# Patient Record
Sex: Female | Born: 1935 | Race: Black or African American | Hispanic: No | Smoking: Former smoker
Health system: Southern US, Community
[De-identification: ages and names within clinical notes are randomized; demographics above are authoritative.]

## PROBLEM LIST (undated history)

## (undated) DIAGNOSIS — I2510 Atherosclerotic heart disease of native coronary artery without angina pectoris: Secondary | ICD-10-CM

## (undated) DIAGNOSIS — I5030 Unspecified diastolic (congestive) heart failure: Secondary | ICD-10-CM

## (undated) DIAGNOSIS — I219 Acute myocardial infarction, unspecified: Secondary | ICD-10-CM

## (undated) DIAGNOSIS — I10 Essential (primary) hypertension: Secondary | ICD-10-CM

## (undated) DIAGNOSIS — E785 Hyperlipidemia, unspecified: Secondary | ICD-10-CM

## (undated) DIAGNOSIS — N184 Chronic kidney disease, stage 4 (severe): Secondary | ICD-10-CM

## (undated) DIAGNOSIS — F0390 Unspecified dementia without behavioral disturbance: Secondary | ICD-10-CM

## (undated) DIAGNOSIS — D649 Anemia, unspecified: Secondary | ICD-10-CM

## (undated) DIAGNOSIS — M109 Gout, unspecified: Secondary | ICD-10-CM

## (undated) DIAGNOSIS — E119 Type 2 diabetes mellitus without complications: Secondary | ICD-10-CM

## (undated) DIAGNOSIS — R0602 Shortness of breath: Secondary | ICD-10-CM

## (undated) DIAGNOSIS — M1990 Unspecified osteoarthritis, unspecified site: Secondary | ICD-10-CM

## (undated) HISTORY — PX: TOTAL SHOULDER REPLACEMENT: SUR1217

## (undated) HISTORY — PX: ABDOMINAL SURGERY: SHX537

## (undated) HISTORY — PX: CHOLECYSTECTOMY: SHX55

## (undated) HISTORY — PX: APPENDECTOMY: SHX54

## (undated) HISTORY — PX: REPLACEMENT TOTAL KNEE: SUR1224

---

## 1997-03-06 HISTORY — PX: CARDIAC CATHETERIZATION: SHX172

## 1997-10-04 DIAGNOSIS — I219 Acute myocardial infarction, unspecified: Secondary | ICD-10-CM

## 1997-10-04 HISTORY — DX: Acute myocardial infarction, unspecified: I21.9

## 1997-10-30 ENCOUNTER — Inpatient Hospital Stay (HOSPITAL_COMMUNITY): Admission: EM | Admit: 1997-10-30 | Discharge: 1997-11-03 | Payer: Self-pay | Admitting: Emergency Medicine

## 1999-06-28 ENCOUNTER — Encounter: Payer: Self-pay | Admitting: Internal Medicine

## 1999-06-28 ENCOUNTER — Ambulatory Visit (HOSPITAL_COMMUNITY): Admission: RE | Admit: 1999-06-28 | Discharge: 1999-06-28 | Payer: Self-pay | Admitting: Internal Medicine

## 1999-10-27 ENCOUNTER — Emergency Department (HOSPITAL_COMMUNITY): Admission: EM | Admit: 1999-10-27 | Discharge: 1999-10-27 | Payer: Self-pay | Admitting: Emergency Medicine

## 2000-11-08 ENCOUNTER — Encounter: Payer: Self-pay | Admitting: Internal Medicine

## 2000-11-08 ENCOUNTER — Ambulatory Visit (HOSPITAL_COMMUNITY): Admission: RE | Admit: 2000-11-08 | Discharge: 2000-11-08 | Payer: Self-pay | Admitting: Internal Medicine

## 2001-12-24 ENCOUNTER — Encounter (HOSPITAL_BASED_OUTPATIENT_CLINIC_OR_DEPARTMENT_OTHER): Admission: RE | Admit: 2001-12-24 | Discharge: 2002-03-24 | Payer: Self-pay | Admitting: Internal Medicine

## 2002-04-04 ENCOUNTER — Encounter (HOSPITAL_BASED_OUTPATIENT_CLINIC_OR_DEPARTMENT_OTHER): Admission: RE | Admit: 2002-04-04 | Discharge: 2002-07-03 | Payer: Self-pay | Admitting: Internal Medicine

## 2002-06-06 ENCOUNTER — Encounter: Payer: Self-pay | Admitting: Orthopedic Surgery

## 2002-06-11 ENCOUNTER — Inpatient Hospital Stay (HOSPITAL_COMMUNITY): Admission: RE | Admit: 2002-06-11 | Discharge: 2002-06-16 | Payer: Self-pay | Admitting: Orthopedic Surgery

## 2002-06-16 ENCOUNTER — Inpatient Hospital Stay (HOSPITAL_COMMUNITY)
Admission: RE | Admit: 2002-06-16 | Discharge: 2002-07-01 | Payer: Self-pay | Admitting: Physical Medicine & Rehabilitation

## 2002-08-14 ENCOUNTER — Encounter (HOSPITAL_BASED_OUTPATIENT_CLINIC_OR_DEPARTMENT_OTHER): Admission: RE | Admit: 2002-08-14 | Discharge: 2002-11-12 | Payer: Self-pay | Admitting: Internal Medicine

## 2003-01-15 ENCOUNTER — Encounter (HOSPITAL_BASED_OUTPATIENT_CLINIC_OR_DEPARTMENT_OTHER): Admission: RE | Admit: 2003-01-15 | Discharge: 2003-01-27 | Payer: Self-pay | Admitting: Internal Medicine

## 2003-04-24 ENCOUNTER — Encounter (HOSPITAL_BASED_OUTPATIENT_CLINIC_OR_DEPARTMENT_OTHER): Admission: RE | Admit: 2003-04-24 | Discharge: 2003-04-30 | Payer: Self-pay | Admitting: Internal Medicine

## 2003-07-29 ENCOUNTER — Encounter (HOSPITAL_BASED_OUTPATIENT_CLINIC_OR_DEPARTMENT_OTHER): Admission: RE | Admit: 2003-07-29 | Discharge: 2003-08-19 | Payer: Self-pay | Admitting: Internal Medicine

## 2003-11-11 ENCOUNTER — Encounter (HOSPITAL_BASED_OUTPATIENT_CLINIC_OR_DEPARTMENT_OTHER): Admission: RE | Admit: 2003-11-11 | Discharge: 2003-12-09 | Payer: Self-pay | Admitting: Internal Medicine

## 2004-09-07 ENCOUNTER — Ambulatory Visit (HOSPITAL_COMMUNITY): Admission: RE | Admit: 2004-09-07 | Discharge: 2004-09-07 | Payer: Self-pay | Admitting: Internal Medicine

## 2005-09-15 ENCOUNTER — Encounter: Admission: RE | Admit: 2005-09-15 | Discharge: 2005-10-18 | Payer: Self-pay | Admitting: Orthopaedic Surgery

## 2006-11-30 ENCOUNTER — Ambulatory Visit (HOSPITAL_COMMUNITY): Admission: RE | Admit: 2006-11-30 | Discharge: 2006-11-30 | Payer: Self-pay | Admitting: Orthopedic Surgery

## 2006-12-07 ENCOUNTER — Ambulatory Visit: Payer: Self-pay | Admitting: Cardiology

## 2006-12-11 ENCOUNTER — Ambulatory Visit: Payer: Self-pay

## 2007-01-01 ENCOUNTER — Inpatient Hospital Stay (HOSPITAL_COMMUNITY): Admission: RE | Admit: 2007-01-01 | Discharge: 2007-01-08 | Payer: Self-pay | Admitting: Orthopedic Surgery

## 2007-05-17 ENCOUNTER — Emergency Department (HOSPITAL_COMMUNITY): Admission: EM | Admit: 2007-05-17 | Discharge: 2007-05-17 | Payer: Self-pay | Admitting: Emergency Medicine

## 2007-05-17 ENCOUNTER — Encounter (INDEPENDENT_AMBULATORY_CARE_PROVIDER_SITE_OTHER): Payer: Self-pay | Admitting: Emergency Medicine

## 2007-05-17 ENCOUNTER — Ambulatory Visit: Payer: Self-pay | Admitting: Surgery

## 2008-04-15 ENCOUNTER — Encounter: Payer: Self-pay | Admitting: Cardiology

## 2008-07-08 ENCOUNTER — Encounter: Payer: Self-pay | Admitting: Cardiology

## 2008-07-22 DIAGNOSIS — E785 Hyperlipidemia, unspecified: Secondary | ICD-10-CM | POA: Insufficient documentation

## 2008-07-22 DIAGNOSIS — I10 Essential (primary) hypertension: Secondary | ICD-10-CM

## 2008-07-22 DIAGNOSIS — M1990 Unspecified osteoarthritis, unspecified site: Secondary | ICD-10-CM | POA: Insufficient documentation

## 2008-07-22 DIAGNOSIS — N259 Disorder resulting from impaired renal tubular function, unspecified: Secondary | ICD-10-CM | POA: Insufficient documentation

## 2008-07-22 DIAGNOSIS — I2510 Atherosclerotic heart disease of native coronary artery without angina pectoris: Secondary | ICD-10-CM | POA: Insufficient documentation

## 2008-07-22 DIAGNOSIS — E871 Hypo-osmolality and hyponatremia: Secondary | ICD-10-CM | POA: Insufficient documentation

## 2008-07-22 DIAGNOSIS — M109 Gout, unspecified: Secondary | ICD-10-CM

## 2008-07-22 DIAGNOSIS — D649 Anemia, unspecified: Secondary | ICD-10-CM

## 2008-07-22 DIAGNOSIS — R42 Dizziness and giddiness: Secondary | ICD-10-CM

## 2008-07-22 DIAGNOSIS — E119 Type 2 diabetes mellitus without complications: Secondary | ICD-10-CM

## 2008-07-24 ENCOUNTER — Ambulatory Visit: Payer: Self-pay | Admitting: Cardiology

## 2008-07-24 DIAGNOSIS — R609 Edema, unspecified: Secondary | ICD-10-CM | POA: Insufficient documentation

## 2008-07-31 ENCOUNTER — Ambulatory Visit: Payer: Self-pay | Admitting: Cardiology

## 2008-07-31 ENCOUNTER — Ambulatory Visit: Payer: Self-pay

## 2008-07-31 ENCOUNTER — Encounter: Payer: Self-pay | Admitting: Cardiology

## 2008-08-05 LAB — CONVERTED CEMR LAB
CO2: 31 meq/L (ref 19–32)
Calcium: 8.9 mg/dL (ref 8.4–10.5)
Creatinine, Ser: 2.4 mg/dL — ABNORMAL HIGH (ref 0.4–1.2)
Glucose, Bld: 99 mg/dL (ref 70–99)

## 2009-05-24 ENCOUNTER — Emergency Department (HOSPITAL_COMMUNITY): Admission: EM | Admit: 2009-05-24 | Discharge: 2009-05-24 | Payer: Self-pay | Admitting: Family Medicine

## 2009-05-24 ENCOUNTER — Ambulatory Visit: Payer: Self-pay | Admitting: Cardiology

## 2009-05-24 ENCOUNTER — Inpatient Hospital Stay (HOSPITAL_COMMUNITY): Admission: EM | Admit: 2009-05-24 | Discharge: 2009-05-27 | Payer: Self-pay | Admitting: Emergency Medicine

## 2009-05-25 ENCOUNTER — Encounter (INDEPENDENT_AMBULATORY_CARE_PROVIDER_SITE_OTHER): Payer: Self-pay | Admitting: Family Medicine

## 2009-06-22 ENCOUNTER — Ambulatory Visit (HOSPITAL_COMMUNITY): Admission: RE | Admit: 2009-06-22 | Discharge: 2009-06-22 | Payer: Self-pay | Admitting: Internal Medicine

## 2010-05-27 LAB — POCT I-STAT, CHEM 8
BUN: 47 mg/dL — ABNORMAL HIGH (ref 6–23)
Calcium, Ion: 1.04 mmol/L — ABNORMAL LOW (ref 1.12–1.32)
Chloride: 108 mEq/L (ref 96–112)
Creatinine, Ser: 3.1 mg/dL — ABNORMAL HIGH (ref 0.4–1.2)
Glucose, Bld: 77 mg/dL (ref 70–99)
HCT: 30 % — ABNORMAL LOW (ref 36.0–46.0)
Hemoglobin: 10.2 g/dL — ABNORMAL LOW (ref 12.0–15.0)
Potassium: 4.3 mEq/L (ref 3.5–5.1)
Sodium: 136 mEq/L (ref 135–145)
TCO2: 28 mmol/L (ref 0–100)

## 2010-05-27 LAB — GLUCOSE, CAPILLARY
Glucose-Capillary: 114 mg/dL — ABNORMAL HIGH (ref 70–99)
Glucose-Capillary: 117 mg/dL — ABNORMAL HIGH (ref 70–99)
Glucose-Capillary: 138 mg/dL — ABNORMAL HIGH (ref 70–99)
Glucose-Capillary: 153 mg/dL — ABNORMAL HIGH (ref 70–99)
Glucose-Capillary: 162 mg/dL — ABNORMAL HIGH (ref 70–99)
Glucose-Capillary: 181 mg/dL — ABNORMAL HIGH (ref 70–99)
Glucose-Capillary: 243 mg/dL — ABNORMAL HIGH (ref 70–99)
Glucose-Capillary: 267 mg/dL — ABNORMAL HIGH (ref 70–99)
Glucose-Capillary: 42 mg/dL — CL (ref 70–99)

## 2010-05-27 LAB — URINE MICROSCOPIC-ADD ON

## 2010-05-27 LAB — CK TOTAL AND CKMB (NOT AT ARMC)
CK, MB: 2.6 ng/mL (ref 0.3–4.0)
Relative Index: 0.7 (ref 0.0–2.5)

## 2010-05-27 LAB — URINE CULTURE: Culture: NO GROWTH

## 2010-05-27 LAB — CBC
HCT: 29.7 % — ABNORMAL LOW (ref 36.0–46.0)
MCHC: 32.8 g/dL (ref 30.0–36.0)
MCHC: 32.9 g/dL (ref 30.0–36.0)
MCV: 92 fL (ref 78.0–100.0)
Platelets: 215 10*3/uL (ref 150–400)
RBC: 3.27 MIL/uL — ABNORMAL LOW (ref 3.87–5.11)
RDW: 14.7 % (ref 11.5–15.5)
RDW: 14.8 % (ref 11.5–15.5)
WBC: 11.1 10*3/uL — ABNORMAL HIGH (ref 4.0–10.5)

## 2010-05-27 LAB — DIFFERENTIAL
Basophils Absolute: 0 10*3/uL (ref 0.0–0.1)
Basophils Relative: 0 % (ref 0–1)
Eosinophils Absolute: 0.5 10*3/uL (ref 0.0–0.7)
Eosinophils Relative: 6 % — ABNORMAL HIGH (ref 0–5)
Lymphocytes Relative: 21 % (ref 12–46)
Lymphs Abs: 1.8 10*3/uL (ref 0.7–4.0)
Monocytes Absolute: 0.6 10*3/uL (ref 0.1–1.0)
Monocytes Relative: 7 % (ref 3–12)
Neutro Abs: 5.7 10*3/uL (ref 1.7–7.7)
Neutrophils Relative %: 67 % (ref 43–77)

## 2010-05-27 LAB — BASIC METABOLIC PANEL
BUN: 47 mg/dL — ABNORMAL HIGH (ref 6–23)
BUN: 63 mg/dL — ABNORMAL HIGH (ref 6–23)
CO2: 26 mEq/L (ref 19–32)
CO2: 26 mEq/L (ref 19–32)
Calcium: 8.6 mg/dL (ref 8.4–10.5)
Chloride: 100 mEq/L (ref 96–112)
Chloride: 102 mEq/L (ref 96–112)
Creatinine, Ser: 2.88 mg/dL — ABNORMAL HIGH (ref 0.4–1.2)
Creatinine, Ser: 3.07 mg/dL — ABNORMAL HIGH (ref 0.4–1.2)
GFR calc Af Amer: 19 mL/min — ABNORMAL LOW (ref >60)
GFR calc non Af Amer: 15 mL/min — ABNORMAL LOW (ref >60)
GFR calc non Af Amer: 16 mL/min — ABNORMAL LOW (ref >60)
Glucose, Bld: 127 mg/dL — ABNORMAL HIGH (ref 70–99)
Glucose, Bld: 241 mg/dL — ABNORMAL HIGH (ref 70–99)
Potassium: 4.7 mEq/L (ref 3.5–5.1)
Potassium: 4.9 mEq/L (ref 3.5–5.1)
Sodium: 137 mEq/L (ref 135–145)
Sodium: 137 mEq/L (ref 135–145)

## 2010-05-27 LAB — URINALYSIS, ROUTINE W REFLEX MICROSCOPIC
Bilirubin Urine: NEGATIVE
Glucose, UA: NEGATIVE mg/dL
Ketones, ur: NEGATIVE mg/dL
Leukocytes, UA: NEGATIVE
Nitrite: NEGATIVE
Protein, ur: NEGATIVE mg/dL
Specific Gravity, Urine: 1.008 (ref 1.005–1.030)
Urobilinogen, UA: 0.2 mg/dL (ref 0.0–1.0)
pH: 5 (ref 5.0–8.0)

## 2010-05-27 LAB — CARDIAC PANEL(CRET KIN+CKTOT+MB+TROPI)
CK, MB: 2.4 ng/mL (ref 0.3–4.0)
Total CK: 272 U/L — ABNORMAL HIGH (ref 7–177)
Troponin I: 0.03 ng/mL (ref 0.00–0.06)
Troponin I: 0.04 ng/mL (ref 0.00–0.06)

## 2010-05-27 LAB — CULTURE, BLOOD (ROUTINE X 2)
Culture: NO GROWTH
Culture: NO GROWTH

## 2010-05-27 LAB — TROPONIN I: Troponin I: 0.06 ng/mL (ref 0.00–0.06)

## 2010-05-27 LAB — IRON AND TIBC: Iron: 61 ug/dL (ref 42–135)

## 2010-05-27 LAB — POCT CARDIAC MARKERS
CKMB, poc: 2.8 ng/mL (ref 1.0–8.0)
Troponin i, poc: <0.05 ng/mL (ref 0.00–0.09)

## 2010-05-27 LAB — RETICULOCYTES
RBC.: 3.21 MIL/uL — ABNORMAL LOW (ref 3.87–5.11)
Retic Count, Absolute: 57.8 10*3/uL (ref 19.0–186.0)

## 2010-05-27 LAB — FERRITIN: Ferritin: 179 ng/mL (ref 10–291)

## 2010-07-19 NOTE — Op Note (Signed)
NAMEJALAIYAH, THROGMORTON              ACCOUNT NO.:  [id number]   MEDICAL RECORD NO.:  [id number]          PATIENT TYPE:  INP   LOCATION:  1608                         FACILITY:  [facility]   PHYSICIAN:  Deidre Ala, M.D.    DATE OF BIRTH:  01/06/1925   DATE OF PROCEDURE:  01/01/2007  DATE OF DISCHARGE:                               OPERATIVE REPORT   PREOPERATIVE DIAGNOSIS:  1. Severe end stage degenerative joint disease, right shoulder.  2. Severe symptomatic osteoarthritis, acromioclavicular joint.  3. Painful right metal Mitek anchors from previous rotator cuff repair      with rotator cuff arthropathy.   POSTOPERATIVE DIAGNOSIS:  1. Severe end stage degenerative joint disease, right shoulder.  2. Severe symptomatic osteoarthritis, acromioclavicular joint.  3. Painful right metal Mitek anchors from previous rotator cuff repair      with rotator cuff arthropathy.   PROCEDURE:  1. Right shoulder hemiarthroplasty using the Global uncemented DePuy      prosthesis with regular head.  2. Open distal clavicle resection.  3. Metal Mitek removal x2.   SURGEON:  1. Charlesetta Shanks, M.D.   ASSISTANT:  Phineas Semen, P.A.-C.   ANESTHESIA:  General endotracheal.   CULTURES:  None.   DRAINS:  None.   BLOOD LOSS:  350 mL.   BLOOD REPLACED:  None.   PATHOLOGIC FINDINGS AND HISTORY:  Mariachristina has bilateral shoulder end stage  DJD.  She has had rotator cuff surgery on the right side which has  failed.  She has rotator cuff arthropathy and a somewhat diminutive  right shoulder humeral head.  She had obviously arthritic AC joint and  severe synovitic changes in the glenohumeral joint which had to be  debrided to get to the joint as well as the [facility] joint.  We tried to place  a CTA head since she was rotator cuff deficient with some re-tearing of  the cuff, but the head would not fit well inside the space allowed, it  would not fit down on the prosthesis well, and we did do a tuberoplasty  to  accommodate the CTA head so I do not think it will cause problems  with upward impingement and she has had previous acromioplasty and today  had a distal clavicle resection. So, we used a regular head which fit  and basically measured exactly the same of what was taken out of her  which was 48 x 15 of the articular head fragment and then we did the  classic cut for the CTA for the tuberosity which will prevent her  bumping upward.  We had good fit and fill with the appropriate  retroversion with using a number 10 stem, length 138, and a 48 x 15 mm  head.  We got a very good capsular and subscapularis combined closure  with 6-7 #1 interrupted FiberWires back to the tuft of tendon where it  was removed.  We left the biceps intact and we oriented the prosthesis  to the apex where the bicipital groove was.  Large amounts of  osteophytes were removed.  We also, through this incision, snuck  upward  into the area of the distal clavicle underneath with the retractor and  was able to do a distal clavicle resection one shaverbreadth and with  debridement of the [facility] joint meniscus and marked synovitis.  We also  debrided marked synovitis out of the glenohumeral joint just to get a  flat surface for the humeral head and this was accomplished.  Two Mitek  anchors were encountered posteriorly on the neck and they were removed  as they were partially protruding after the neck cut and were somewhat  loose as well as numerous Ethibond sutures.   OPERATIVE PROCEDURE:  With adequate anesthesia obtained using  endotracheal technique, 1 gram Ancef given IV prophylaxis, the patient  was placed in the supine beach chair position.  The right shoulder was  prepped and draped in the standard fashion.  After standard prepping and  draping, skin markings were made for anatomic positioning.  We made an  incision from the coracoid process down into the axilla crisscross over  toward the deltoid insertion with a  modified deltopectoral incision of  [place].  The incision was deepened sharply with the knife and  hemostasis obtained using the Bovie electrocoagulator.  Under headlamp  illumination and loupe magnification, the incision was deepened sharply  with the knife and hemostasis obtained using Bovie electrocoagulator.  Dissection was carried down through the fat layer and the deltopectoral  interval was identified.  A fairly vestigial cephalic vein was noted and  it was clamped and tied with silk sutures.  We then released the pre-  coracoid fascia.  I then released the lateral aspect and tagged the  conjoined tendon laterally 1/3 to gain better exposure of the head.  Deep retractors were placed.   I then, just medial to the lesser tuberosity and the long head of the  biceps with needle point Bovie, incised and reflected medially and  inferiorly superiorly the flap of the capsule and the subscapularis and  tagged with multiple #2 FiberWires.  This exposed the humeral head and  glenoid.  This was reflected backward and retracted.  We then removed  osteophytes around the humeral head.  We excised a significant villous  synovitis out of the glenoid so that we could get back to the glenoid  surface.  I then used the template and with the arm rotated about 40  degrees external, made a straight back cut in the line with the template  removing the articular head fragment and measured it.  Further  debridement was carried out.  I then drilled with the intermedullary  drill and then a hand drill, canal finder, and then broached up to a 10,  placed a 10 trial. We cut for the CTA head flanged on the tuberosity but  the CTA head did not fit well, so we ended up trialing a 48 x 15 with  good reduction.  We then, in the appropriate anteversion and the apex  toward the bicipital groove and with some local bone grafting from the  humeral head, impacted the implantable stem, 10 mm flush, and impacted  it  with no rotation seen with the local impaction grafting.  We then  trialed again with a 48 x 15.  We then implanted the 48 x 15 head.  We  then brought the capsule and subscapularis flap back to the lesser  tuberosity and sutured it down with multiple interrupted #2 FiberWire  sutures, repairing the anterior capsule and subscapularis and some  stitches superior and  inferior to complete the capsular closure.  This  reduced the head nicely.  We had an anterior posterior drawer about half  the humeral head.  We had abduction, forward flexion with hand up toward  the top of the head, so that it was not overstuffed and it felt about  appropriate to the size of the head that was removed.   We then further irrigated, closed the conjoined tendon back to the  coracoid with a tagging stitch of #1 Ethibond, closed the deep layer  with a running 0 Vicryl on the deltopectoral fascia, and then 2-0 and 3-  0 Vicryl on the subcu, and later skin staples.  0.5% Marcaine injected  in and about the wound.  Prior to this, we had retracted up underneath  toward the spike of the distal clavicle, dissected the soft tissues down  to the distal clavicle transverse to it as one would do with a saber  cut, and dissected out with the Bovie the villous synovitis and AC  meniscus, and completely debrided it and then, with bone cutting  forceps, did a distal clavicle resection with the bevel about 10 mm in.  We then smoothed it further and closed the soft tissues with 0 Vicryl  crisscross figure-of-eight to close the soft tissues over the top.  After that, we proceeded with the general closure of the deltopectoral  fascia, subcu, and skin.  Please note that just prior to implanting the  prosthesis, we did find the two Mitek anchors posterior superior that  were protruding and we removed those along with several suture knots  with a rongeur.  Thorough irrigation was carried  out prior to the general closure.  Once the  closure was obtained and  Marcaine placed, a bulky sterile compressive dressing was applied with  Aquasil and sling.  The patient, having tolerated the procedure well,  was awakened and taken to the recovery room in satisfactory condition to  be admitted for routine postoperative care.           ______________________________  V. Charlesetta Shanks, M.D.     VEP/MEDQ  D:  01/01/2007  T:  01/01/2007  Job:  [id number]   cc:   Margaretmary Bayley, M.D.  Fax: [phone]   Madolyn Frieze. Jens Som, MD, [facility]  [address]  [address]  [place]  [place] [address]   Luis Abed, MD, [facility]  [address]  [address]  [place]  [place] [address]

## 2010-07-19 NOTE — Assessment & Plan Note (Signed)
[facility] OFFICE NOTE   [name], [name]                     MRN:          [id number]  DATE:12/07/2006                            DOB:          01/06/1925    [name] [name] is a very pleasant 77 year old female who I had followed in  the past for coronary disease.  Her cardiac history dates back to August  of 1999.  At that time, she presented with a myocardial infarction.  She  underwent cardiac catheterization.  She was found to have a normal left  main.  There was no obstructive disease in the LAD.  There was 60% to  70% second marginal.  There was a 90% proximal right coronary artery  followed by an 80% stenosis.  There was also a spontaneous spiral  dissection starting in the proximal right coronary artery and extending  through the mid RCA to the acute angle of the RCA.  At that time, the  patient had a PCI of her right coronary artery.  Her most recent Myoview  was performed on May 27, 2003.  Ejection fraction was 63%.  There was  a small inferior basal defect consistent with scar, but no ischemia.  Since I last saw her in September 2005, she has done well.  She denies  any dyspnea, orthopnea, PND, palpitations, or chest pain.  She does  occasionally have pedal edema.  She has had problems with pain in her  shoulders bilaterally, and is scheduled for shoulder replacement.  We  were asked to evaluate preoperatively.   MEDICATIONS INCLUDE:  1. Amaryl 4 mg p.o. b.i.d.  2. Allopurinol 150 mg p.o. daily.  3. Aspirin 81 mg p.o. daily.  4. Coreg CR 20 mg p.o. daily.  5. Demadex 20 mg p.o. daily.  6. Actos 15 mg p.o. every other day.  7. Felodipine ER 5 mg p.o. daily.   PHYSICAL EXAMINATION:  VITAL SIGNS:  Shows a blood pressure of 120/70.  Her pulse is 60.  She weighs 220 pounds.  HEENT:  Normal.  NECK:  Supple with no bruits.  CHEST:  Clear.  CARDIOVASCULAR EXAM:  Reveals a regular rate and  rhythm.  ABDOMINAL EXAM:  Benign.  EXTREMITIES:  Show trace to 1+ ankle edema, left greater than right.   I do have electrocardiograms from November 30, 2006 at [facility].  These shows sinus rhythm with occasional PAC's and a prior  inferior infarct.   DIAGNOSES:  1. Preoperative evaluation.  [name] [name] has had no chest pain or      shortness of breath, but she does have a history of coronary      disease, as well as diabetes mellitus and hypertension.  We will      plan to proceed with an adenosine Myoview for risk stratification.      If this shows no ischemia, or is low risk, then we will plan to      proceed with surgery without further cardiac workup.  2. Coronary artery disease.  She will continue on her aspirin and beta  blocker.  Note, she has not been interested in taking a statin the      past, and remains unwilling.  I have asked her to discuss this with      Dr. Chestine Spore.  3. Hypertension.  Her blood pressure is well controlled on her present      medications.  4. Diabetes mellitus.  Per Dr. Chestine Spore.  5. History of renal insufficiency.  Her BUN and creatinine are      followed by Dr. Chestine Spore.   We will see her back in approximately 12 months, or sooner if necessary.     Madolyn Frieze Jens Som, MD, [facility]  Electronically Signed    BSC/MedQ  DD: 12/07/2006  DT: 12/07/2006  Job #: [id number]   cc:   [name] [name], M.D.  [name] [name], M.D.

## 2010-07-19 NOTE — Consult Note (Signed)
[name] [name], [name] [name]              ACCOUNT NO.:  [id number]   MEDICAL RECORD NO.:  [id number]          PATIENT TYPE:  INP   LOCATION:  1608                         FACILITY:  [facility]   PHYSICIAN:  Mindi Slicker. Lowell Guitar, M.D.  DATE OF BIRTH:  01/06/1925   DATE OF CONSULTATION:  DATE OF DISCHARGE:                                 CONSULTATION   NEPHROLOGY CONSULTATION:   REFERRING PHYSICIAN:  Dr. Deidre Ala.   REASON FOR CONSULTATION:  I was asked by Dr. Renae Fickle to see this 77-year-  old female with renal disease.  The patient was admitted on January 01, 2007 for a right shoulder arthroplasty.  Intraoperatively, the patient  develops mild hypotension that was corrected quickly.  Preop, the serum  creatinine was 2 mg/dL on November 30, 2006.  Urinalysis was negative  at that time.  On the day of the surgery, October 28, serum creatinine  was 1.86 mg/dL and on October 29, serum creatinine was 1.78 mg/kg and  today on October 30, serum creatinine is 2.03 mg/dL.  Of note, according  to the [facility] computer, on June 06, 2002, serum creatinine was  measured at 1.7 mg/dL.  The patient's primary physician is Dr. Margaretmary Bayley.  The patient has history of hypertension and diabetes of many  years' duration.  She denies any recent NSAID congestion, but admits to  taking the them in the past.  She also admits to taking Celebrex in the  past.  She currently takes glucosamine for her arthritis.   PAST HISTORY:  1. Status post myocardial infarction in 1999.  2. Non-insulin-dependent diabetes mellitus.  3. Hypertension.  4. Vertigo.  5. History of prior left total knee replacement.  6. History of chronic kidney disease.   CURRENT MEDICATIONS:  Zyloprim, Coreg, ferrous sulfate, Actos and  Reglan.   PHYSICAL EXAMINATION:  VITAL SIGNS:  Blood pressure is 116/68,  temperature is 98.8 and heart rate 70.  GENERAL:  This is a pleasant, obese [place] female.  She  appears uncomfortable.  HEENT:  Atraumatic, normocephalic.  Extraocular movements are intact.  LUNGS:  Clear anteriorly.  HEART:  Regular rhythm and rate.  ABDOMEN:  Soft, obese.  EXTREMITIES:  Trace edema bilaterally.  The right shoulder is in a  sling.  There are chronic changes of the joints.  NEUROLOGIC:  Not  tested.   ASSESSMENT:  Chronic kidney disease, stage IV.   PLAN:  We will follow and defer any further renal workup to this  patient's primary care physician, Dr. Chestine Spore.  If renal function worsens  during this hospitalization, we will work up her acute and chronic renal  failure.  I would continue to avoid NSAIDs as you are doing.   Thanks for allowing me to see this patient.      Mindi Slicker. Lowell Guitar, M.D.  Electronically Signed     ACP/MEDQ  D:  01/03/2007  T:  01/04/2007  Job:  [id number]   cc:   Deidre Ala, M.D.  Fax: [phone]   Margaretmary Bayley, M.D.  Fax: [phone]

## 2010-07-19 NOTE — Discharge Summary (Signed)
[name] [name], [name] [name]              ACCOUNT NO.:  [id number]   MEDICAL RECORD NO.:  [id number]          PATIENT TYPE:  INP   LOCATION:  1608                         FACILITY:  [facility]   PHYSICIAN:  Deidre Ala, M.D.    DATE OF BIRTH:  01/06/1925   DATE OF ADMISSION:  01/01/2007  DATE OF DISCHARGE:  01/05/2007                               DISCHARGE SUMMARY   FINAL DIAGNOSES:  1. End-stage degenerative joint disease, right shoulder.  2. Hypertension.  3. Diabetes mellitus Type 2.  4. Gout.  5. Hyperlipidemia.  6. Hyponatremia.  7. Blood loss anemia.  8. Renal insufficiency.   PROCEDURES:  On January 01, 2007, right shoulder hemiarthroplasty  surgery with distal clavicle resection and middle Mitek removal x2.   SURGEON:  1. Charlesetta Shanks, M.D.   HISTORY:  This is a 77 year old African-American female who has been  followed by Dr. Renae Fickle for some time about her shoulder pain.  The right  has been worse than the left.  She had injections in the shoulder.  She  had physical therapy, all of which has failed.  Range of motion is  significantly limited and is very painful.  She could not reach even her  forehead.  Because of this, she has opted for surgical intervention.  We  will reschedule her for surgery.   HOSPITAL COURSE:  Patient was admitted on January 01, 2007 to [facility].  There, she underwent right shoulder hemiarthroplasty  with distal clavicle resection and removal of two middle Mytek anchors.  Patient tolerated the procedure well.  There were no intraoperative  complication.  Postoperatively, the patient had noted worsening of her  creatinine.  It continued to elevate throughout her stay.  Because of  this, Dr. Lowell Guitar from [facility] was consulted.  He saw  the patient and noted that she has stage IV chronic renal disease.  He  was to follow her and would not do any further workup at this time but  after she sees her regular MD, Dr. Chestine Spore, he  can refer her back to them  for further workup as needed.  She also had blood loss anemia noted,  which remained relatively stable until the third postoperative day, at  which time her hemoglobin was down to 7.1, and her hematocrit was 27.3.  At this time, she received 2 units of packed red cells.  Repeat CBC on  the following day showed stable hemoglobin and hematocrit.  She was  working with physical therapy, but she was slow to respond.  She had  significant pain in her shoulder still postoperatively.  Her range of  motion was still limited.  At this point, it was decided whether or not  she would go home versus going to a skilled nursing facility.   On January 04, 2007, she was noted to have still a lot of pain in the  right shoulder with inability to move it.  The following morning, the  decision was made to discharge to home.  She was subsequently given  Percocet to take 1-2 p.o. q.4-6h. for pain.  MEDICATIONS:  She will continue her regular medications as taken prior  to discharge, which are:  1. Coreg CR 20 mg daily.  2. Actos 15 mg p.o. q.o.d.  3. Xylopram 150 mg daily.  4. She will continue on ferrous sulfate 325 mg p.o. daily for the next      30 days.  5. She will also be given some Robaxin to take 500 mg p.o. q.8h.      p.r.n.   Patient will follow up with Dr. Renae Fickle in approximately 10 days.  At that  time, we will remove her sutures/staples.   Patient is discharged in satisfactory and stable condition on January 05, 2007.      Phineas Semen, P.A.    ______________________________  Seth Bake. Charlesetta Shanks, M.D.    CL/MEDQ  D:  01/04/2007  T:  01/05/2007  Job:  [id number]

## 2010-07-22 NOTE — Op Note (Signed)
NAME:  [name] [name]                        ACCOUNT NO.:  [id number]   MEDICAL RECORD NO.:  [id number]                   PATIENT TYPE:  INP   LOCATION:  5009                                 FACILITY:  [facility]   PHYSICIAN:  Ollen Gross, M.D.                 DATE OF BIRTH:  01/06/1925   DATE OF PROCEDURE:  06/11/2002  DATE OF DISCHARGE:                                 OPERATIVE REPORT   PREOPERATIVE DIAGNOSIS:  Osteoarthritis, left knee.   POSTOPERATIVE DIAGNOSIS:  Osteoarthritis, left knee.   PROCEDURE:  Left total knee arthroplasty.   SURGEON:  Ollen Gross, M.D.   ASSISTANT:  Alexzandrew L. Julien Girt, P.A.   ANESTHESIA:  General plus femoral block.   ESTIMATED BLOOD LOSS:  Minimal.   DRAINS:  Hemovac x 1 .   COMPLICATIONS:  None.   TOURNIQUET TIME:  69 minutes at 350 mmHg.   CONDITION:  Stable to the recovery room.   BRIEF CLINICAL NOTE:  [name] is a 77 year old female who has severe  end stage arthritis of both knees with severe deformities and pain  refractory to nonoperative management.  The left knee was more symptomatic  than the right.  She presents now for left total knee arthroplasty.   PROCEDURE IN DETAIL:  After successful administration of femoral block and  general anesthetic, a tourniquet was placed high on the left thigh and the  left lower extremity prepped and draped in the usual sterile fashion.  The  extremity was wrapped in Esmarch, knee flexed, and tourniquet inflated to  350 mmHg.  A standard midline incision was made with a 10 blade through the  subcutaneous tissue to the level of the extensor mechanism.  A fresh blade  was used to make a parapatellar arthrotomy.  She had moderate size effusion,  a tremendous amount of hypertrophic synovial tissue which I excised.  We  then elevated the soft tissue over the proximal medial tibia,  subperiosteally, the joint with the knife and semimembranosus with a Cobb  elevator.  I had a fairly long  soft tissue sleeve, this had a severe varus  deformity.  We then everted the patella, flexed the knee 90 degrees, removed  the chondral osteophytes and the PCL.  A drill was used to create a starting  hole in the distal femur and the canal was irrigated.  A 5 degree left  valgus alignment guide is placed.  Reference of the condyle, the irritation  is marked and removing 10 mm of the distal femur and distal femoral  resection subsequently made with an oscillating saw.  A sizing guide is  placed and a size 3 is the most appropriate.  With the 3, if we referenced  off the posterior condyles, would have internally rotated the femur, thus I  referenced off the epicondylar axis to gain the appropriate external  rotation.  We pinned the size 3 block and  made the anterior and posterior  cuts.  The tibia was then subluxed anteriorly and the menisci were removed.  The extramedullary tibial alignment guide was placed referencing proximally  at the medial aspect of the tibial tubercle and distally along the second  metatarsal axis of the tibial crest.  I pinned the block and moved 10 mm  from the less deficient lateral side.  She had a really deep medial defect.  We did the cut and it was an excellent smooth surface.  We removed  osteophytes all the way around the medial side and thus there was cancellous  bone throughout the entire cut surface as opposed to having any further  defect.  A size 3 was the most appropriate tibial size.  We then completed  the femoral preparation with the intercondylar and chamfer cuts.   A size 3 posterior stabilized femoral trial, size 3 fixed bearing tibial  trial, and a 12.5 mm posterior stabilized insert placed.  With the 12.5, she  hyperextended a little and had a little bit of varus valgus play, we went up  to 15 which had fantastic balance throughout the full range of motion with  full extension and excellent varus valgus balance down past the 120 degrees  of  flexion.  We marked the rotation in full extension and the alignment rod  corresponded with the second metatarsal axis.  We then prepared the patella,  first by removing all the soft tissue and tendon and measured the thickness  which was 24 mm.  Free hand resections were taken to 14 mm.  The 38 template  was placed, holes were drilled, trial patella was placed and it tracked  normally.  We then completed the tibial preparation proximally with the  modular drill and keel punch and removed the osteophytes from the posterior  femur.  She had several large loose osseous bodies behind the femur which  were removed.   The cut bone surfaces were prepared with pulsatile lavage and the cement was  mixed.  Once we were ready for implantation, size 3 fixed bearing tibial  tray, size 3 posterior stabilized femur, and 38 patella were cemented in  place, the patella held with a clamp.  A trial 15 mm insert is placed with  the knee in full extension, all excess cement is removed.  Once the cement  was fully hardened, a permanent 15 mm posterior stabilized insert is placed  to the tibial tray.  Excellent balance is once again noted throughout full  range of motion.  We then placed the permanent posterior stabilized insert  into the size 3 tibial tray.  The wound was then copiously irrigated with  antibiotic solution and the extensor mechanism was closed over a Hemovac  drain with interrupted #1 PDS, the subcu was closed with interrupted 2-0  Vicryl, the subcuticular with running 4-0 Monocryl.  The incision was  cleaned and dried, Steri-Strips and a bulky, sterile dressing was applied.  The patient was subsequently awakened and transferred to the recovery room  in stable condition.                                               Ollen Gross, M.D.    FA/MEDQ  D:  06/11/2002  T:  06/12/2002  Job:  [id number]

## 2010-07-22 NOTE — Discharge Summary (Signed)
NAME:  [name] [name], [name] [name]                        ACCOUNT NO.:  [id number]   MEDICAL RECORD NO.:  [id number]                   PATIENT TYPE:  INP   LOCATION:  5009                                 FACILITY:  [facility]   PHYSICIAN:  Alexzandrew L. Julien Girt, P.A.        DATE OF BIRTH:  01/06/1925   DATE OF ADMISSION:  06/11/2002  DATE OF DISCHARGE:  06/16/2002                                 DISCHARGE SUMMARY   ADMISSION DIAGNOSES:  1. Osteoarthritis left knee.  2. Vertigo.  3. Hypertension.  4. History of myocardial infarction.  5. Non insulin diabetes mellitus.   DISCHARGE DIAGNOSES:  1. Osteoarthritis left knee, status post left total knee replacement     arthroplasty.  2. Postoperative blood loss anemia.  3. Status post  transfusion without sequelae.  4. Vertigo.  5. History of myocardial infarction.  6. Non insulin diabetes mellitus.   PROCEDURES:  The patient was taken to the operating room on June 11, 2002,  and underwent a left  total knee arthroplasty, surgeon Ollen Gross, M.D.,  assistant Alexzandrew L. Perkins, P.A.-C., under general anesthesia with a  femoral block. Minimal blood loss. Hemovac drain x1. Tourniquet time 69  minutes at 350 mmHg.   CONSULTS:  Rehabilitation services.   HISTORY OF PRESENT ILLNESS:  The patient is a 77 year old female who has  been seen by Dr. Lequita Halt for ongoing knee pain. The patient has been treated  and followed by Dr. Lequita Halt in the past. She does have bilateral knee pain,  but the left is more symptomatic than the right. This has been ongoing for  many years now. She has been treated conservatively. She has been told in  the past that she would require knee replacement. She had been for cardiac  workup by Dr. Jens Som and was cleared for surgery and was recommended by a  friend to see Dr. Lequita Halt.   She was seen in the office where she was found to have bone-on-bone changes.  It was felt she would be an appropriate candidate for  surgery. The risks and  benefits were discussed and the patient was subsequently admitted to the  hospital.   LABORATORY DATA:  CBC on admission: Hemoglobin 11.3, hematocrit 33.5, white  cell count 9.2, red blood cell count 3.6, post transfusion 8.4, hematocrit  24.8. Given blood, post transfusion hemoglobin 9.9. Last noted hemoglobin  and hematocrit 9.2 and 27.0. Differential on admission CBC all within normal  limits. PT,  PTT on admission 13.8 and 33 respectively with an INR  of 1.0.  Serial protimes followed. Last noted PT/INR 18.8 and 1.7. Chem panel on  admission, elevated glucose of 231, elevated BUN of 28. Total protein low at  5.9, decreased albumin of 3.3. Remaining chem panel within normal limits.  Serial BMETs were followed. Glucose went from 231 down to 226 down to 199.  BUN went from 28 to 25 back to 29, creatinine bumped up a little bit  from  1.7 to 2.0. Calcium dropped from 9.0 to 8.2. Sodium  dropped a little from  137 down to 133. Urinalysis negative. Blood type B positive.   An EKG dated June 06, 2002, normal sinus rhythm with a first degree AV  block, nonspecific T-wave abnormalities, no significant change since the  last tracing, confirmed by Dr. Nicki Guadalajara. A chest x-ray dated October 30, 1997, cardiomegaly, atelectasis in the right base.   HOSPITAL COURSE:  The patient was admitted to [facility] and was  taken to the operating room and underwent  the above stated procedure  without complications. The patient tolerated the procedure well. She was  later transferred to the recovery room and admitted to the orthopedic floor  and continued with postoperative care. Vital signs were followed. The  patient was given 24 hours of postoperative IV antibiotics and placed on  Coumadin for DVT prophylaxis. Physical therapy and occupational therapy were  consulted to assist with gait training and ambulation and activities of  daily living. A rehabilitation consult  was also ordered postoperatively. The  Hemovac drain placed at the time of surgery was left in on day 1 and pulled  on day 2.   Unfortunately the patient had a drop in her hemoglobin down to 8.4. She was  given 2 units of blood. Post transfusion her hemoglobin had responded well  and she came back up to 9.9 by day 2. The PCA and IVs were discontinued. The  Foley catheter was out. She had been by the rehabilitation service and was  felt to be an appropriate candidate for inpatient rehabilitation. Physical  therapy and occupational therapy were consulted postoperatively.   The patient was slow to progress with physical therapy and was only  ambulating approximately  8 feet by postoperative day #2 and then up to 15  feet by postoperative day #5. She was progressing slowly. Her incision was  checked on day 2 with a dressing change and  then each day thereafter. The  incision was healing well. She continued to slowly improve.   By day 5 on June 16, 2002, it was noted that a bed became available in  rehabilitation. The patient was in agreement with the treatment. She was  transferred at that time.   DISPOSITION:  The patient was transferred to [facility]  rehabilitation on June 16, 2002.   DISCHARGE MEDICATIONS:  Continue current medications. An INR to be sent  over.   DISCHARGE INSTRUCTIONS:  Diet, cardiac diabetic diet. Activity full weight  bearing to the left lower extremity. Continue with gait training, ambulation  and activities of daily living per physical therapy and occupational therapy  while on rehabilitation.   FOLLOW UP:  Follow up in 2 weeks from surgery.   CONDITION ON DISCHARGE:  Improved.                                               Alexzandrew L. Julien Girt, P.A.    ALP/MEDQ  D:  08/01/2002  T:  08/02/2002  Job:  [id number]

## 2010-07-22 NOTE — H&P (Signed)
[name] [name], RIDEOUT              ACCOUNT NO.:  [id number]   MEDICAL RECORD NO.:  [id number]          PATIENT TYPE:  INP   LOCATION:  1608                         FACILITY:  [facility]   PHYSICIAN:  Deidre Ala, M.D.    DATE OF BIRTH:  01/06/1925   DATE OF ADMISSION:  01/01/2007  DATE OF DISCHARGE:                              HISTORY & PHYSICAL   CHIEF COMPLAINT:  Chronic right shoulder pain.   HISTORY:  This is an 77 year old white female with history of DJD of the  right shoulder.  She has been followed by Dr. Renae Fickle for some time.  She  has had significant osteoarthritis of the right shoulder.  Because of  these findings, it came down to the patient needing surgery.  She has  been scheduled for a right shoulder hemiarthroplasty, and she will be  admitted to [facility] on January 01, 2007 for this procedure.   PAST MEDICAL HISTORY:  1. 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  2. Hypertension.  3. Diabetes mellitus.  4. Gout.  5. Hyperlipidemia.  6. Renal insufficiency.  7. Status post myocardial infarction in 1999.  8. History of vertigo.   PAST SURGICAL HISTORY:  1. Left total knee arthroplasty in the past.  2. Gallbladder surgery in the 1960s.  3. History of right rotator cuff repair 10 years ago.   CURRENT MEDICATIONS:  1. Torsemide 20 mg daily.  2. Felodipine ER 5 mg daily.  3. Coreg 20 mg daily.  4. Actos 50 mg every other day.  5. Aspirin 81 mg daily.  6. Glyburide 4 mg b.i.d.  7. Allopurinol 300 mg q.h.s.   SOCIAL HISTORY:  The patient denies the use of alcohol or tobacco.   FAMILY HISTORY:  Positive for coronary artery disease.  Hypertension.  No history of diabetes in her family that she knows of.   REVIEW OF SYSTEMS:  Positive for diabetes mellitus, hypertension,  coronary artery disease.  No history of any COPD, PND, __________  or  asthma.   PHYSICAL EXAMINATION:  GENERAL:  Well-developed, well-nourished  moderately obese 77 year old [place] female alert, oriented,  and cooperative.  HEENT:  Normocephalic, atraumatic.  Extraocular muscles intact.  PERRL.  Oropharynx clear.  Mucous membranes pink and moist.  NECK:  Supple without JVD, lymphadenopathy, or thyromegaly.  No carotid  bruits are noted.  Trachea is midline.  CHEST:  Symmetric.  Clear to auscultation.  No wheezes, rhonchi, or  rales noted.  CARDIOVASCULAR:  Regular rate and rhythm without murmurs, rubs, or  gallops.  ABDOMEN:  Soft.  Bowel sounds present.  No palpable pulsatile masses.  No hernias.  GU/RECTAL:  Deferred.  EXTREMITIES:  Without clubbing, cyanosis, or edema.  There is limited  range of motion, especially in the right shoulder.  There is also  limited range of motion of her left shoulder.  Peripheral pulses intact.  NEUROLOGIC:  Cranial nerves II-XII are grossly intact without focal  deficits.   IMPRESSION:  End-stage degenerative joint disease, right shoulder.   PLAN:  The patient will  undergo right shoulder hemiarthroplasty.      Phineas Semen, P.A.    ______________________________  Seth Bake. Charlesetta Shanks, M.D.    CL/MEDQ  D:  01/28/2007  T:  01/28/2007  Job:  [id number]

## 2010-07-22 NOTE — H&P (Signed)
NAME:  [name] [name], [name] [name]                        ACCOUNT NO.:  [id number]   MEDICAL RECORD NO.:  [id number]                   PATIENT TYPE:  INP   LOCATION:  5009                                 FACILITY:  [facility]   PHYSICIAN:  Ollen Gross, M.D.                 DATE OF BIRTH:  01/06/1925   DATE OF ADMISSION:  06/11/2002  DATE OF DISCHARGE:  06/16/2002                                HISTORY & PHYSICAL   CHIEF COMPLAINT:  Left knee pain.   HISTORY OF PRESENT ILLNESS:  The patient is a 77 year old female who has  been seen by Dr. Lequita Halt for ongoing left knee pain.  She has been treated  conservatively in the past for her left knee.  Her pain has been progressive  in nature over the past several years.  She was a previous patient of Dr.  Pearletha Furl.  She has been treated for bilateral knee pain.  However, the left  is more symptomatic then the right.  She has been seen earlier by another  orthopaedics, and recommended to undergo a knee replacement.  She apparently  had a cardiac workup by Dr. Jens Som, was cleared for surgery, but was told  by a friend to come over to see Dr. Lequita Halt.  She was evaluated and found to  have severe end-stage changes with end-stage symptoms.  It was felt she  would be an appropriate candidate.  Risks and benefits discussed, and the  patient has elected to proceed with surgery.   ALLERGIES:  No known drug allergies.   CURRENT MEDICATIONS:  1. Demodex 20 mg.  2. Bayer aspirin 81 mg, stopped prior to surgery.  3. Plendil 5 mg.  4. Antevert 12.5 mg.  5. Hydrocodone 5 mg p.r.n.  6. Tenormin 50 mg.  7. Allopurinol 300 mg.  8. Glucotrol XL 5 mg.   PAST MEDICAL HISTORY:  1. Vertigo.  2. Hypertension.  3. History of myocardial infarction in 1999.  4. Non-insulin dependent diabetes mellitus.   PAST SURGICAL HISTORY:  Gallbladder surgery.   SOCIAL HISTORY:  Widowed, retired, nonsmoker, no alcohol.  Has one child.  One story home with three to four steps  entering.   FAMILY HISTORY:  Mother deceased in her 1's with arthritis.  Has a sister  age 35 with arthritis.   REVIEW OF SYMPTOMS:  GENERAL:  No fevers, chills, or night sweats.  NEUROLOGIC:  She does have vertigo.  No seizures, syncope, or paralysis.  RESPIRATORY:  No shortness of breath, productive cough, or hemoptysis.  CARDIOVASCULAR:  Hypertension and myocardial infarction, however, denies any  chest pain, angina, or orthopnea.  GASTROINTESTINAL:  No nausea, vomiting,  diarrhea, or constipation.  GENITOURINARY:  No dysuria, hematuria, or  discharge.  MUSCULOSKELETAL:  Pertinent to that of the knee found in the  history of present illness.   PHYSICAL EXAMINATION:  VITAL SIGNS:  Pulse 60, respirations 12, blood  pressure  152/82.  GENERAL:  The patient is a 77 year old African-American female, well-  developed, well-nourished, overweight, appears to be in no acute distress.  HEENT:  Normocephalic, atraumatic.  Pupils equal, round, reactive to light.  Oropharynx clear.  Extraocular movements were intact.  NECK:  Supple.  CHEST:  Clear to auscultation anterior and posterior chest walls.  HEART:  Regular rate and rhythm.  ABDOMEN:  Round protuberant abdomen, bowel sounds are present.  RECTAL:  Not done, not pertinent to present illness.  BREASTS:  Not done, not pertinent to present illness.  GENITALIA:  Not done, not pertinent to present illness.  EXTREMITIES:  Significant to the knees lower extremity.  She does have  bilateral varus deformities, the left is more pronounced then the right.  The left knee shows 5 degrees of hyperextension with flexion up to 105  degrees, minimal laxity is noted with some firm endpoints.  She is diffusely  tender.   IMPRESSION:  1. Osteoarthritis of bilateral knees, left more symptomatic then right.  2. Hypertension.  3. History of myocardial infarction.  4. Vertigo.  5. Non-insulin dependent diabetes mellitus.   PLAN:  The patient will be  admitted to [facility] to undergo a  left total knee replacement arthroplasty.  Surgery will be performed by Dr.  Ollen Gross.       Alexzandrew L. Julien Girt, P.A.              Ollen Gross, M.D.    ALP/MEDQ  D:  07/08/2002  T:  07/09/2002  Job:  [id number]

## 2010-07-22 NOTE — Discharge Summary (Signed)
NAME:  [name] [name], [name] [name]                        ACCOUNT NO.:  [id number]   MEDICAL RECORD NO.:  [id number]                   PATIENT TYPE:  IPS   LOCATION:  4142                                 FACILITY:  [facility]   PHYSICIAN:  Ranelle Oyster, M.D.             DATE OF BIRTH:  01/06/1925   DATE OF ADMISSION:  06/16/2002  DATE OF DISCHARGE:  07/01/2002                                 DISCHARGE SUMMARY   DISCHARGE DIAGNOSES:  1. Left total knee arthroplasty secondary to osteoarthritis.  2. Diabetes mellitus.  3. History of gout.  4. History of vertigo.  5. Acute renal insufficiency.  6. Deep vein thrombosis prophylaxis.  7. Anemia.   HISTORY OF PRESENT ILLNESS:  The patient is a 77 year old black female with  past history of diabetes mellitus, OA of the left knee, elected to undergo a  left total knee replacement on April 7 by Ollen Gross, M.D.  The patient  is presently on Coumadin for DVT prophylaxis.  PT report at this time, the  patient is ambulating, mild assist, 15 feet with rolling walker,  weightbearing as tolerated, can transfer with total assist.  Postoperative  complications included, constipation, elevated BUN and creatinine.  The  patient was transferred to [facility] Department on Jul 16, 2002.   PAST MEDICAL HISTORY:  Significant for as above, glaucoma, gout, vertigo,  PUD.   PAST SURGICAL HISTORY:  Cholecystectomy, right rotator cuff repair.   Primary care by Margaretmary Bayley, M.D.; Cardiologist, Olga Millers, M.D.   FAMILY HISTORY:  Noncontributory.   SOCIAL HISTORY:  Patient lives with son in one level home.  She was  independent prior to admission.  Denies any tobacco or alcohol use.  She has  a master's degree in social work.   REVIEW OF SYSTEMS:  Significant for dizziness and joint pain.   ALLERGIES:  No known drug allergies.   HOSPITAL COURSE:  [name] [name] was admitted to [facility] Department on June 16, 2002, for comprehensive patient  rehabilitation, received more than three hours of therapy daily.  Hospital  course significant for the following:   Problem 1.  Left TKA secondary to OA.  At first, [name] [name] got off to a  slow start with her rehabilitation secondary to pain.  The patient was  placed on Tylenol p.r.n. as well as Asacol as needed for pain.  As she  progressed with therapy, she did improve.  The patient remained using a CPM  at night and had approximately 75% flexion of her right knee at the time of  discharge.  She remained on Coumadin as well as Lovenox for DVT prophylaxis.  Lovenox discontinued once Coumadin reached therapeutic level.  Lovenox  discontinued on June 08, 2002.  We did have elevated Coumadin, INR levels  and Coumadin had to be held.  Coumadin was followed by pharmacist.  There  were no complications  of bleeding noted.  The patient was discharged at a  modified independent level and ambulating with 100 feet with rolling walker.   Problem 2.  History of diabetes mellitus, remained in fairly good control  while in rehab.  No adjustment was necessary in her Glucotrol.  She remained  on Glucotrol 5 mg h.s. p.o. b.i.d.   Problem 3.  Renal insufficiency.  The patient did have an elevated BUN as  well as creatinine on admission labs.  The patient had admission BUN of 30,  admission creatinine of 1.8.  The patient was encouraged to increase her  fluids.  On June 18, 2002, Demadex was decreased from 30 mg q.o.d. daily,  to 20 mg daily and also was placed on hold on June 08, 2002, due to rise in  BUN.  Demadex was restarted on June 25, 2002, as level normalized.   Problem 4.  History of anemia.  The patient had admission hemoglobin in  rehabilitation of 9.1, admission hematocrit 27.1.  She remained on Trinsicon  1 tablet p.o. b.i.d.  The latest hemoglobin was performed on June 08, 2002,  was 8.4, hematocrit 25.2.  The patient is to follow up with primary care   Huntley Demedeiros regarding hemoglobin.   Problem 5.  History of gout.  She remained on Zyloprim 150 mg p.o. daily  throughout her entire stay in rehab.  No gout exacerbation was noted.   Problem 6.  History of hypertension.  She remained on Tenormin 50 mg p.o.  daily.  Blood pressure remained in fairly well control while in rehab.   There were no other medical issues besides insomnia.  She received Desyrel  as needed and insomnia did improve.   LABORATORY DATA:  Latest labs:  White blood cell count was 8.7, hemoglobin  9.1, hematocrit 27.1, platelet count 247.  Hemoccult performed on stools,  the result was negative.  Latest INR 2.8, latest potassium 4.5,  sodium 134,  chloride 102, CO2 27, glucose 107, BUN 29, creatinine 1.8.  AST 28, ALT 12.  Urine culture performed on June 16, 2002:  4000 colonies of growth.   DISCHARGE PHYSICAL EXAMINATION:  At time of discharge, all vitals were  stable.  Blood pressure 127/87.  Patient able to flex her knees to 75  degrees, surgical incision well-healed, no signs of infection.   PT report on the day of discharge, the patient was ambulating approximately  100 feet, modified independent with rolling walker, transfers sit to stand  modified independent.   The patient made good progress during her stay in rehab.   DISPOSITION:  The patient was discharged home with her family.   DISCHARGE MEDICATIONS:  1. Trinsicon 1 tablet twice daily.  2. Glucotrol 10 mg twice daily.  3. Tenormin 50 mg daily.  4. Plendil 5 mg daily.  5. Demadex, resume home dose.  6. Allopurinol, resume.  7. Baby aspirin.  8. Oxycodone 5-10 mg as needed.  9. Antivert as needed.  10.      Multivitamin 1 tablet daily.  11.      Calcium 1 tablet daily.   PAIN MANAGEMENT:  Oxycodone, Tylenol.   ACTIVITY:  No driving, use wheelchair, use walker.  No drinking alcohol.   DIET:  No concentrated sweets.  DISCHARGE INSTRUCTIONS:  She is to check her CBGs at least twice a day and   record results at times.  She is to drink plenty of fluids.  She will have  [facility] for PT, OT.  She is to follow up with Dr. Sherlean Foot within  two weeks, follow with Margaretmary Bayley, M.D. in three to four weeks to monitor  anemia and BUN and creatinine.  Followup with Ranelle Oyster, M.D. as  needed.      Junie Bame, P.A.                       Ranelle Oyster, M.D.    LH/MEDQ  D:  07/30/2002  T:  07/30/2002  Job:  [id number]   cc:   Ranelle Oyster, M.D.  [address] [place]  [place] [address]  Fax: [phone]   Ollen Gross, M.D.  [address]  [place]  [place] [address]  Fax: [phone]   Margaretmary Bayley, M.D.  [address], [address]  [place]  [place] [address]  Fax: [phone]   Olga Millers, [place].D.

## 2010-11-28 LAB — I-STAT 8, (EC8 V) (CONVERTED LAB)
Acid-Base Excess: 1
BUN: 28 — ABNORMAL HIGH
Chloride: 104
Glucose, Bld: 191 — ABNORMAL HIGH
Potassium: 3.8
pCO2, Ven: 39.6 — ABNORMAL LOW
pH, Ven: 7.415 — ABNORMAL HIGH

## 2010-11-28 LAB — CBC
MCHC: 33.3
MCV: 91.4
Platelets: 201
RDW: 14.7
WBC: 10

## 2010-11-28 LAB — POCT I-STAT CREATININE
Creatinine, Ser: 2.1 — ABNORMAL HIGH
Operator id: 288331

## 2010-11-28 LAB — DIFFERENTIAL: Neutrophils Relative %: 78 — ABNORMAL HIGH

## 2010-12-13 LAB — COMPREHENSIVE METABOLIC PANEL
ALT: 8
AST: 19
Alkaline Phosphatase: 81
CO2: 27
Calcium: 8.4
GFR calc Af Amer: 25 — ABNORMAL LOW
GFR calc non Af Amer: 20 — ABNORMAL LOW
Potassium: 5.1
Sodium: 137
Total Protein: 5.1 — ABNORMAL LOW

## 2010-12-13 LAB — CBC
MCHC: 32.9
MCHC: 33.7
MCV: 89.8
Platelets: 204
RBC: 3.15 — ABNORMAL LOW

## 2010-12-13 LAB — BASIC METABOLIC PANEL
BUN: 25 — ABNORMAL HIGH
CO2: 21
Chloride: 106
Creatinine, Ser: 1.73 — ABNORMAL HIGH

## 2010-12-13 LAB — POTASSIUM: Potassium: 4.6

## 2010-12-14 LAB — BASIC METABOLIC PANEL
BUN: 23
CO2: 21
CO2: 22
CO2: 23
Calcium: 8 — ABNORMAL LOW
Calcium: 8.1 — ABNORMAL LOW
Calcium: 8.1 — ABNORMAL LOW
Calcium: 8.1 — ABNORMAL LOW
Chloride: 101
Chloride: 102
Creatinine, Ser: 1.78 — ABNORMAL HIGH
Creatinine, Ser: 2.09 — ABNORMAL HIGH
GFR calc Af Amer: 29 — ABNORMAL LOW
GFR calc Af Amer: 33 — ABNORMAL LOW
GFR calc non Af Amer: 23 — ABNORMAL LOW
Glucose, Bld: 84
Glucose, Bld: 88
Sodium: 130 — ABNORMAL LOW
Sodium: 130 — ABNORMAL LOW
Sodium: 131 — ABNORMAL LOW

## 2010-12-14 LAB — TYPE AND SCREEN
ABO/RH(D): B POS
Donor AG Type: NEGATIVE
PT AG Type: NEGATIVE

## 2010-12-14 LAB — URINALYSIS, ROUTINE W REFLEX MICROSCOPIC
Glucose, UA: 100 — AB
Ketones, ur: NEGATIVE
Protein, ur: NEGATIVE

## 2010-12-14 LAB — CBC
HCT: 21.3 — ABNORMAL LOW
Hemoglobin: 7.1 — CL
Hemoglobin: 8 — ABNORMAL LOW
MCHC: 32.7
MCHC: 33.5
MCV: 89.7
MCV: 90.2
Platelets: 185
Platelets: 238
RBC: 2.36 — ABNORMAL LOW
RBC: 2.68 — ABNORMAL LOW
RBC: 2.73 — ABNORMAL LOW
RDW: 14
RDW: 14.1 — ABNORMAL HIGH
WBC: 15.1 — ABNORMAL HIGH
WBC: 9.3

## 2010-12-14 LAB — COMPREHENSIVE METABOLIC PANEL
ALT: <8
Albumin: 3.4 — ABNORMAL LOW
Alkaline Phosphatase: 90
Glucose, Bld: 96
Potassium: 4
Sodium: 138
Total Protein: 6

## 2010-12-14 LAB — APTT: aPTT: 35

## 2010-12-14 LAB — DIFFERENTIAL
Basophils Absolute: 0
Lymphocytes Relative: 23
Neutro Abs: 5.1
Neutrophils Relative %: 65

## 2010-12-14 LAB — ABO/RH: ABO/RH(D): B POS

## 2010-12-14 LAB — PROTIME-INR: Prothrombin Time: 14.1

## 2010-12-14 LAB — URINE CULTURE

## 2010-12-15 LAB — URINALYSIS, ROUTINE W REFLEX MICROSCOPIC
Bilirubin Urine: NEGATIVE
Nitrite: NEGATIVE
Specific Gravity, Urine: 1.007
pH: 6.5

## 2010-12-15 LAB — DIFFERENTIAL
Basophils Absolute: 0
Basophils Relative: 0
Eosinophils Absolute: 0.5
Eosinophils Relative: 6 — ABNORMAL HIGH
Monocytes Absolute: 0.5

## 2010-12-15 LAB — COMPREHENSIVE METABOLIC PANEL
ALT: 8
Alkaline Phosphatase: 90
BUN: 26 — ABNORMAL HIGH
Calcium: 9.3
Creatinine, Ser: 2 — ABNORMAL HIGH
GFR calc non Af Amer: 24 — ABNORMAL LOW
Glucose, Bld: 127 — ABNORMAL HIGH
Sodium: 143
Total Bilirubin: 0.4
Total Protein: 6.2

## 2010-12-15 LAB — PROTIME-INR: Prothrombin Time: 14.5

## 2010-12-15 LAB — URINE CULTURE

## 2010-12-15 LAB — CBC
Hemoglobin: 10.7 — ABNORMAL LOW
MCHC: 33.1
RDW: 14.3 — ABNORMAL HIGH

## 2010-12-15 LAB — URINE MICROSCOPIC-ADD ON

## 2011-10-28 IMAGING — CR DG CHEST 2V
2 series · 2 of 2 positions shown · non-contrast
Comparison: November 30, 2006

CLINICAL DATA: Cough and bronchitis, shortness of breath

CHEST - 2 VIEW

[view not recorded (1 of 2)]
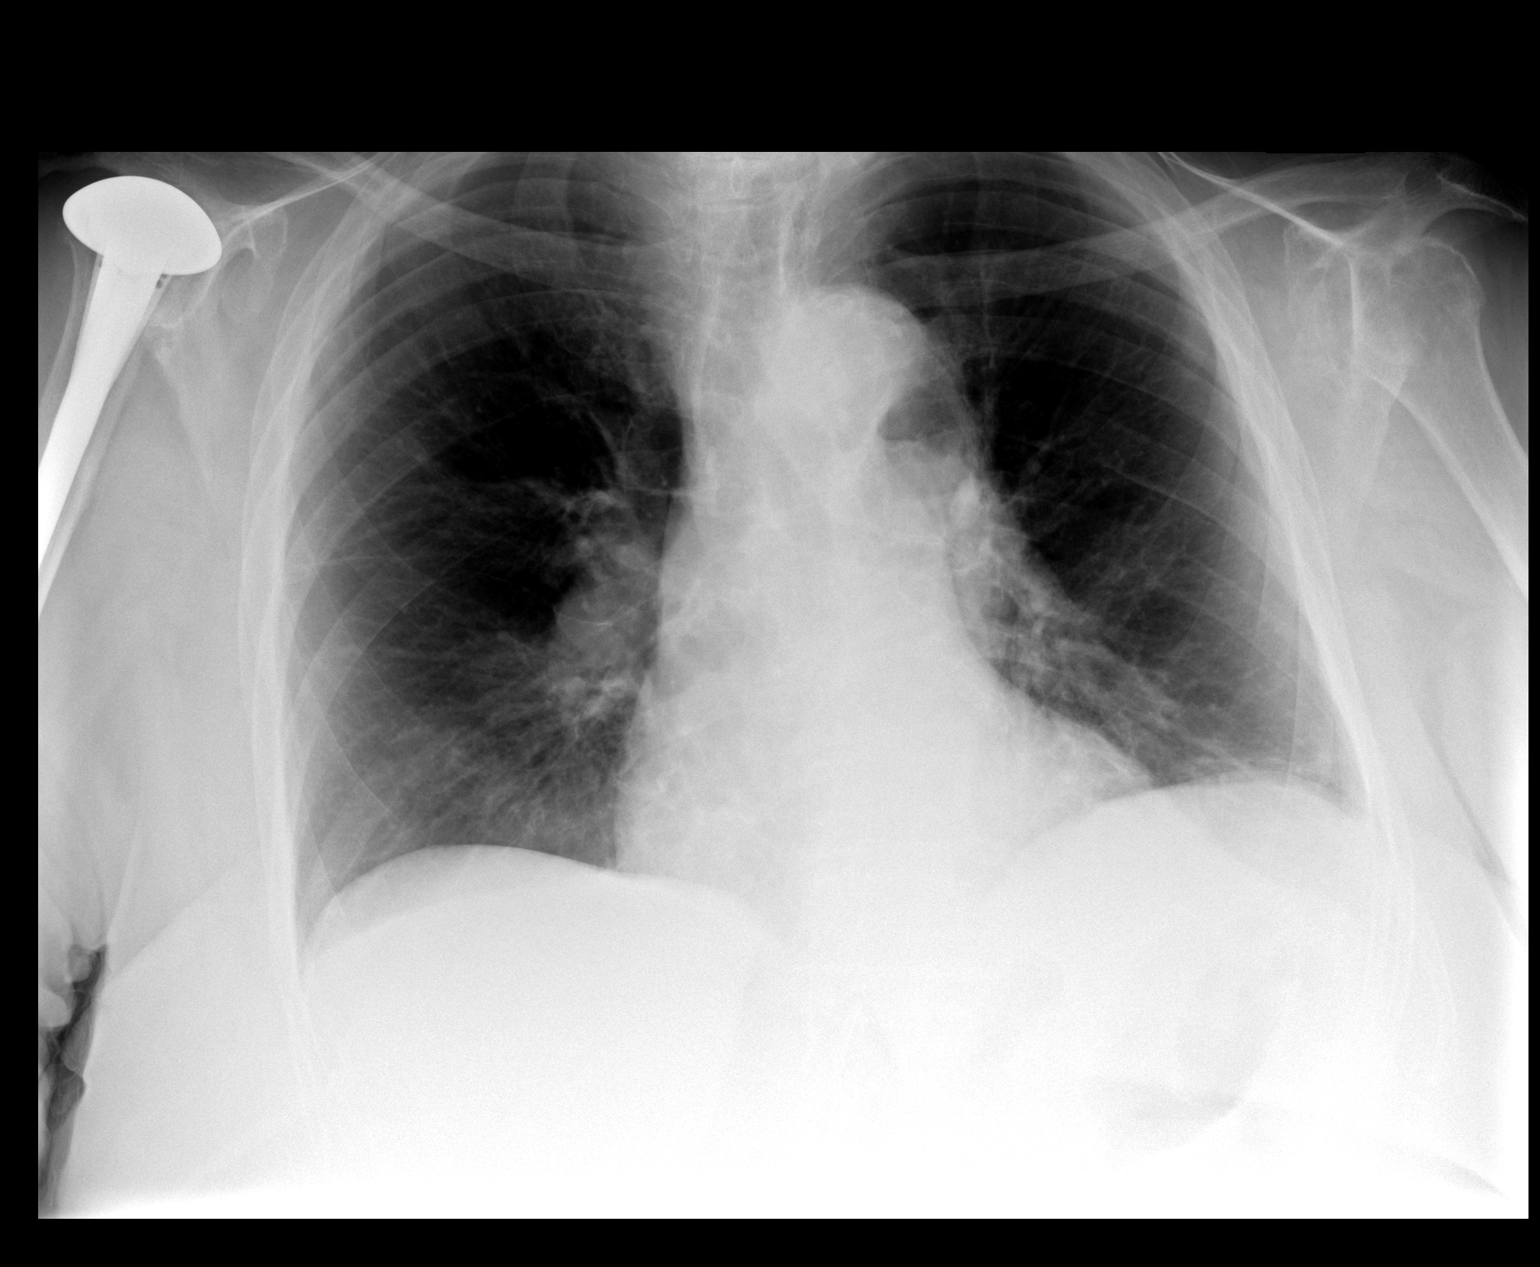

[view not recorded (2 of 2)]
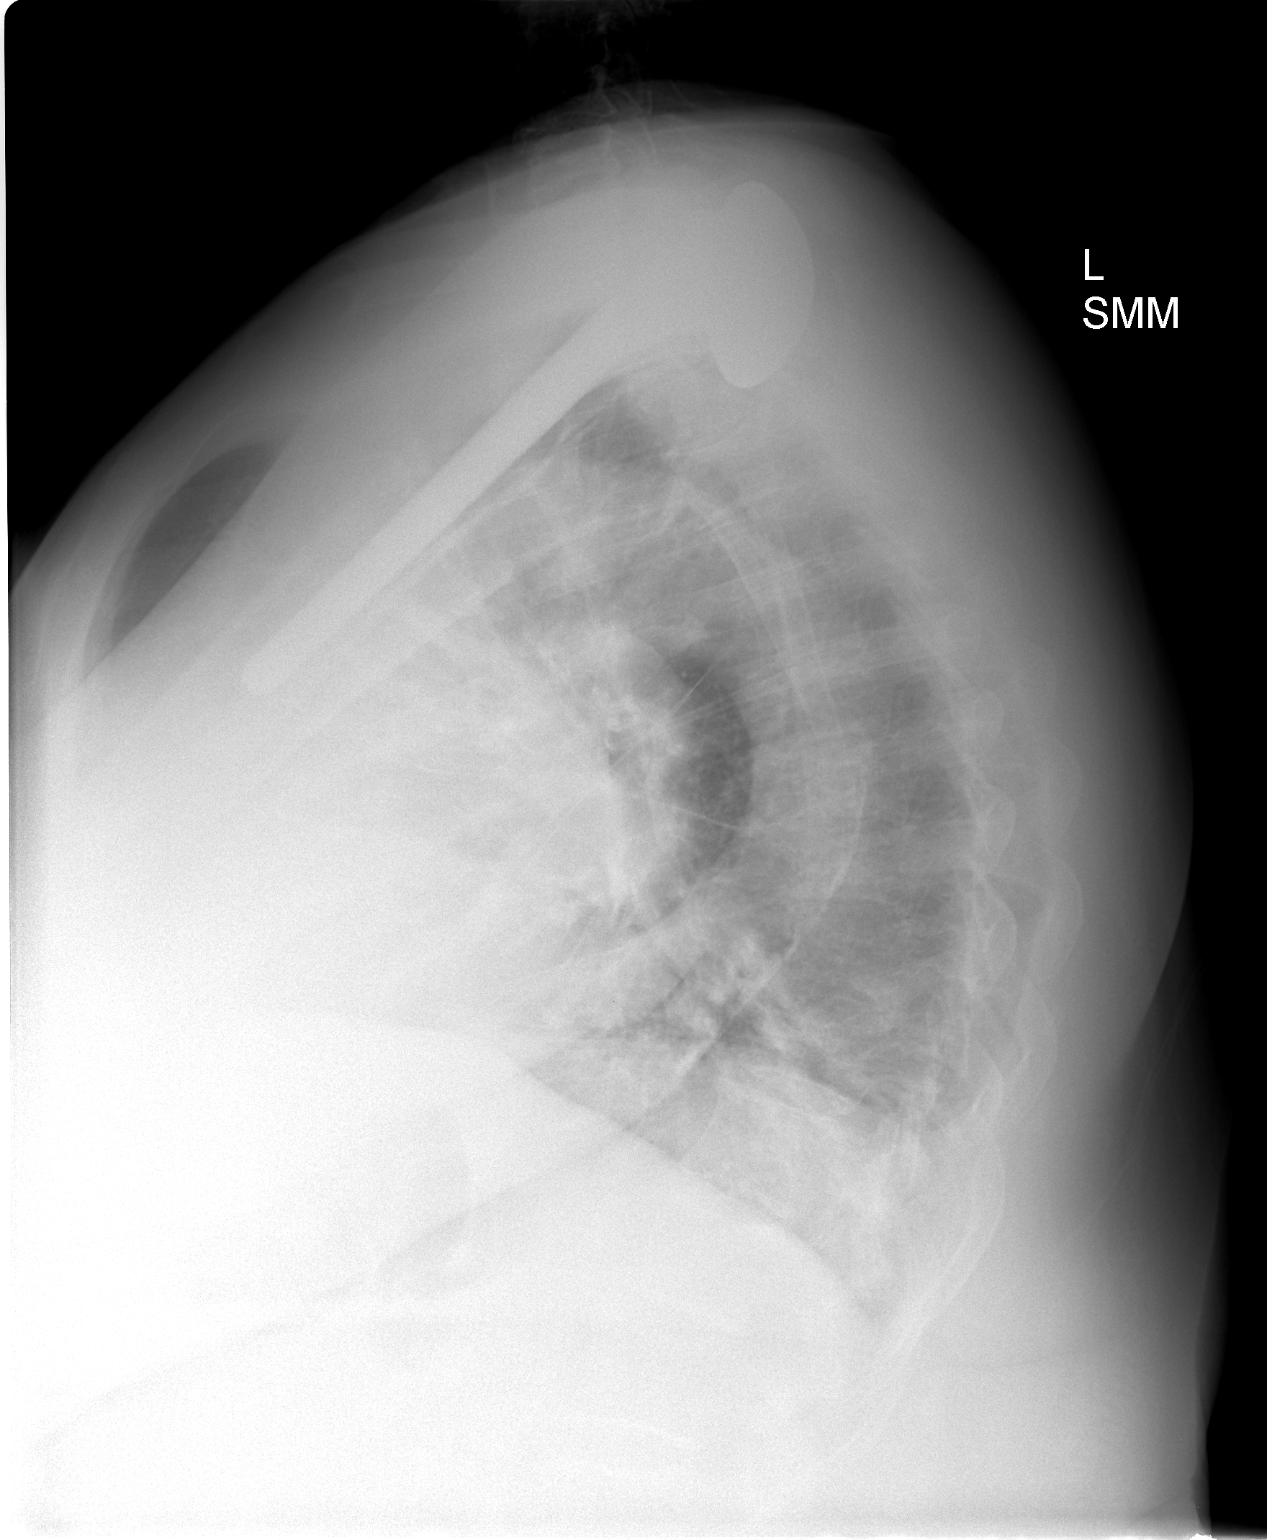

[2 of 2 positions shown; findings below may reference images not displayed]

FINDINGS: The cardiac silhouette, mediastinum, pulmonary
vasculature are within normal limits.  There is a small amount of
patchy opacity within the posterior left lung base . No pleural
effusions are identified.  The right proximal humerus has been
replaced since the prior study.
IMPRESSION: Atelectasis versus developing infiltrate within the posterior
lateral left lung base.

## 2012-02-07 ENCOUNTER — Ambulatory Visit: Payer: Medicare Other | Attending: Internal Medicine | Admitting: Rehabilitative and Restorative Service Providers"

## 2012-02-07 DIAGNOSIS — Z96659 Presence of unspecified artificial knee joint: Secondary | ICD-10-CM | POA: Insufficient documentation

## 2012-02-07 DIAGNOSIS — R262 Difficulty in walking, not elsewhere classified: Secondary | ICD-10-CM | POA: Insufficient documentation

## 2012-02-07 DIAGNOSIS — IMO0001 Reserved for inherently not codable concepts without codable children: Secondary | ICD-10-CM | POA: Insufficient documentation

## 2012-02-07 DIAGNOSIS — Z96619 Presence of unspecified artificial shoulder joint: Secondary | ICD-10-CM | POA: Insufficient documentation

## 2012-02-07 DIAGNOSIS — M25519 Pain in unspecified shoulder: Secondary | ICD-10-CM | POA: Insufficient documentation

## 2012-02-19 ENCOUNTER — Ambulatory Visit: Payer: Medicare Other | Admitting: Rehabilitation

## 2012-02-22 ENCOUNTER — Ambulatory Visit: Payer: Medicare Other | Admitting: Rehabilitation

## 2012-02-26 ENCOUNTER — Encounter: Payer: Medicare Other | Admitting: Rehabilitation

## 2012-02-27 ENCOUNTER — Encounter: Payer: Medicare Other | Admitting: Rehabilitation

## 2012-03-11 ENCOUNTER — Ambulatory Visit: Payer: Medicare Other | Admitting: Rehabilitation

## 2012-03-12 ENCOUNTER — Encounter: Payer: Medicare Other | Admitting: Rehabilitation

## 2012-03-14 ENCOUNTER — Encounter: Payer: Medicare Other | Admitting: Rehabilitation

## 2012-03-14 ENCOUNTER — Encounter: Payer: Self-pay | Admitting: Rehabilitation

## 2012-03-20 ENCOUNTER — Ambulatory Visit: Payer: Medicare Other | Admitting: Rehabilitation

## 2012-03-21 ENCOUNTER — Ambulatory Visit: Payer: Medicare Other | Admitting: Physical Therapy

## 2012-03-28 ENCOUNTER — Ambulatory Visit: Payer: Medicare Other | Admitting: Rehabilitation

## 2012-07-02 ENCOUNTER — Ambulatory Visit: Payer: Medicare Other | Attending: Internal Medicine | Admitting: Physical Therapy

## 2012-08-24 ENCOUNTER — Encounter (HOSPITAL_COMMUNITY): Payer: Self-pay | Admitting: Emergency Medicine

## 2012-08-24 ENCOUNTER — Inpatient Hospital Stay (HOSPITAL_COMMUNITY)
Admission: EM | Admit: 2012-08-24 | Discharge: 2012-08-27 | DRG: 291 | Disposition: A | Discharge status: Home Health | Payer: Medicare Other | Attending: Internal Medicine | Admitting: Internal Medicine

## 2012-08-24 ENCOUNTER — Emergency Department (HOSPITAL_COMMUNITY): Payer: Medicare Other

## 2012-08-24 DIAGNOSIS — T50905A Adverse effect of unspecified drugs, medicaments and biological substances, initial encounter: Secondary | ICD-10-CM

## 2012-08-24 DIAGNOSIS — I2510 Atherosclerotic heart disease of native coronary artery without angina pectoris: Secondary | ICD-10-CM | POA: Diagnosis present

## 2012-08-24 DIAGNOSIS — N259 Disorder resulting from impaired renal tubular function, unspecified: Secondary | ICD-10-CM

## 2012-08-24 DIAGNOSIS — E785 Hyperlipidemia, unspecified: Secondary | ICD-10-CM | POA: Diagnosis present

## 2012-08-24 DIAGNOSIS — Z9861 Coronary angioplasty status: Secondary | ICD-10-CM

## 2012-08-24 DIAGNOSIS — Z96619 Presence of unspecified artificial shoulder joint: Secondary | ICD-10-CM

## 2012-08-24 DIAGNOSIS — N179 Acute kidney failure, unspecified: Secondary | ICD-10-CM | POA: Diagnosis present

## 2012-08-24 DIAGNOSIS — I872 Venous insufficiency (chronic) (peripheral): Secondary | ICD-10-CM | POA: Diagnosis present

## 2012-08-24 DIAGNOSIS — I498 Other specified cardiac arrhythmias: Secondary | ICD-10-CM | POA: Diagnosis present

## 2012-08-24 DIAGNOSIS — N184 Chronic kidney disease, stage 4 (severe): Secondary | ICD-10-CM | POA: Diagnosis present

## 2012-08-24 DIAGNOSIS — I5032 Chronic diastolic (congestive) heart failure: Secondary | ICD-10-CM

## 2012-08-24 DIAGNOSIS — Z79899 Other long term (current) drug therapy: Secondary | ICD-10-CM

## 2012-08-24 DIAGNOSIS — I130 Hypertensive heart and chronic kidney disease with heart failure and stage 1 through stage 4 chronic kidney disease, or unspecified chronic kidney disease: Principal | ICD-10-CM | POA: Diagnosis present

## 2012-08-24 DIAGNOSIS — I252 Old myocardial infarction: Secondary | ICD-10-CM

## 2012-08-24 DIAGNOSIS — M1990 Unspecified osteoarthritis, unspecified site: Secondary | ICD-10-CM | POA: Diagnosis present

## 2012-08-24 DIAGNOSIS — N19 Unspecified kidney failure: Secondary | ICD-10-CM

## 2012-08-24 DIAGNOSIS — E119 Type 2 diabetes mellitus without complications: Secondary | ICD-10-CM | POA: Diagnosis present

## 2012-08-24 DIAGNOSIS — D649 Anemia, unspecified: Secondary | ICD-10-CM | POA: Diagnosis present

## 2012-08-24 DIAGNOSIS — E46 Unspecified protein-calorie malnutrition: Secondary | ICD-10-CM | POA: Diagnosis present

## 2012-08-24 DIAGNOSIS — I50811 Acute right heart failure: Secondary | ICD-10-CM | POA: Diagnosis present

## 2012-08-24 DIAGNOSIS — D631 Anemia in chronic kidney disease: Secondary | ICD-10-CM | POA: Diagnosis present

## 2012-08-24 DIAGNOSIS — R001 Bradycardia, unspecified: Secondary | ICD-10-CM

## 2012-08-24 DIAGNOSIS — R42 Dizziness and giddiness: Secondary | ICD-10-CM

## 2012-08-24 DIAGNOSIS — Z96659 Presence of unspecified artificial knee joint: Secondary | ICD-10-CM

## 2012-08-24 DIAGNOSIS — M109 Gout, unspecified: Secondary | ICD-10-CM | POA: Diagnosis present

## 2012-08-24 DIAGNOSIS — I509 Heart failure, unspecified: Secondary | ICD-10-CM

## 2012-08-24 DIAGNOSIS — Z7982 Long term (current) use of aspirin: Secondary | ICD-10-CM

## 2012-08-24 DIAGNOSIS — I10 Essential (primary) hypertension: Secondary | ICD-10-CM | POA: Diagnosis present

## 2012-08-24 DIAGNOSIS — R609 Edema, unspecified: Secondary | ICD-10-CM | POA: Diagnosis present

## 2012-08-24 DIAGNOSIS — E871 Hypo-osmolality and hyponatremia: Secondary | ICD-10-CM

## 2012-08-24 DIAGNOSIS — I5033 Acute on chronic diastolic (congestive) heart failure: Secondary | ICD-10-CM | POA: Diagnosis present

## 2012-08-24 DIAGNOSIS — N189 Chronic kidney disease, unspecified: Secondary | ICD-10-CM

## 2012-08-24 HISTORY — DX: Acute myocardial infarction, unspecified: I21.9

## 2012-08-24 HISTORY — DX: Essential (primary) hypertension: I10

## 2012-08-24 HISTORY — DX: Atherosclerotic heart disease of native coronary artery without angina pectoris: I25.10

## 2012-08-24 HISTORY — DX: Gout, unspecified: M10.9

## 2012-08-24 HISTORY — DX: Hyperlipidemia, unspecified: E78.5

## 2012-08-24 HISTORY — DX: Chronic kidney disease, stage 4 (severe): N18.4

## 2012-08-24 HISTORY — DX: Type 2 diabetes mellitus without complications: E11.9

## 2012-08-24 HISTORY — DX: Anemia, unspecified: D64.9

## 2012-08-24 LAB — CBC WITH DIFFERENTIAL/PLATELET
Basophils Absolute: 0 10*3/uL (ref 0.0–0.1)
Eosinophils Absolute: 0.4 10*3/uL (ref 0.0–0.7)
Eosinophils Relative: 6 % — ABNORMAL HIGH (ref 0–5)
HCT: 24.8 % — ABNORMAL LOW (ref 36.0–46.0)
Lymphocytes Relative: 24 % (ref 12–46)
MCH: 29.1 pg (ref 26.0–34.0)
MCHC: 33.1 g/dL (ref 30.0–36.0)
MCV: 87.9 fL (ref 78.0–100.0)
Monocytes Absolute: 0.5 10*3/uL (ref 0.1–1.0)
RDW: 15.3 % (ref 11.5–15.5)
WBC: 7.4 10*3/uL (ref 4.0–10.5)

## 2012-08-24 LAB — COMPREHENSIVE METABOLIC PANEL
AST: 11 U/L (ref 0–37)
CO2: 20 mEq/L (ref 19–32)
Calcium: 8.1 mg/dL — ABNORMAL LOW (ref 8.4–10.5)
Creatinine, Ser: 3.59 mg/dL — ABNORMAL HIGH (ref 0.50–1.10)
GFR calc Af Amer: 12 mL/min — ABNORMAL LOW (ref >90)
GFR calc non Af Amer: 11 mL/min — ABNORMAL LOW (ref >90)
Total Protein: 5.9 g/dL — ABNORMAL LOW (ref 6.0–8.3)

## 2012-08-24 LAB — PRO B NATRIURETIC PEPTIDE: Pro B Natriuretic peptide (BNP): 16719 pg/mL — ABNORMAL HIGH (ref 0–450)

## 2012-08-24 MED ORDER — FUROSEMIDE 10 MG/ML IJ SOLN
60.0000 mg | Freq: Once | INTRAMUSCULAR | Status: AC
  Administered 2012-08-24: 60 mg via INTRAVENOUS
  Filled 2012-08-24 (×2): qty 6

## 2012-08-24 MED ORDER — ALLOPURINOL 100 MG PO TABS
100.0000 mg | ORAL_TABLET | Freq: Every day | ORAL | Status: DC
  Administered 2012-08-25 – 2012-08-27 (×3): 100 mg via ORAL
  Filled 2012-08-24 (×3): qty 1

## 2012-08-24 MED ORDER — SODIUM CHLORIDE 0.9 % IV SOLN: 250.0000 mL | INTRAVENOUS | Status: DC | PRN

## 2012-08-24 MED ORDER — INSULIN ASPART 100 UNIT/ML ~~LOC~~ SOLN: 0.0000 [IU] | Freq: Three times a day (TID) | SUBCUTANEOUS | Status: DC

## 2012-08-24 MED ORDER — DOCUSATE SODIUM 100 MG PO CAPS
100.0000 mg | ORAL_CAPSULE | Freq: Two times a day (BID) | ORAL | Status: DC
  Administered 2012-08-25 – 2012-08-27 (×6): 100 mg via ORAL
  Filled 2012-08-24 (×7): qty 1

## 2012-08-24 MED ORDER — LEVOTHYROXINE SODIUM 50 MCG PO TABS
50.0000 ug | ORAL_TABLET | Freq: Every day | ORAL | Status: DC
  Administered 2012-08-25 – 2012-08-27 (×3): 50 ug via ORAL
  Filled 2012-08-24 (×4): qty 1

## 2012-08-24 MED ORDER — INSULIN ASPART 100 UNIT/ML ~~LOC~~ SOLN: 0.0000 [IU] | Freq: Every day | SUBCUTANEOUS | Status: DC

## 2012-08-24 MED ORDER — CARVEDILOL 25 MG PO TABS
25.0000 mg | ORAL_TABLET | Freq: Two times a day (BID) | ORAL | Status: DC
  Filled 2012-08-24 (×3): qty 1

## 2012-08-24 MED ORDER — HEPARIN SODIUM (PORCINE) 5000 UNIT/ML IJ SOLN
5000.0000 [IU] | Freq: Three times a day (TID) | INTRAMUSCULAR | Status: DC
  Administered 2012-08-25 – 2012-08-27 (×8): 5000 [IU] via SUBCUTANEOUS
  Filled 2012-08-24 (×11): qty 1

## 2012-08-24 MED ORDER — SODIUM CHLORIDE 0.9 % IJ SOLN
3.0000 mL | Freq: Two times a day (BID) | INTRAMUSCULAR | Status: DC
  Administered 2012-08-25 – 2012-08-26 (×5): 3 mL via INTRAVENOUS

## 2012-08-24 MED ORDER — FUROSEMIDE 10 MG/ML IJ SOLN
80.0000 mg | Freq: Two times a day (BID) | INTRAMUSCULAR | Status: DC
  Administered 2012-08-25: 80 mg via INTRAVENOUS
  Filled 2012-08-24 (×3): qty 8

## 2012-08-24 MED ORDER — AMLODIPINE BESYLATE 5 MG PO TABS
5.0000 mg | ORAL_TABLET | Freq: Every day | ORAL | Status: DC
  Administered 2012-08-26 – 2012-08-27 (×2): 5 mg via ORAL
  Filled 2012-08-24 (×3): qty 1

## 2012-08-24 MED ORDER — SODIUM CHLORIDE 0.9 % IJ SOLN: 3.0000 mL | INTRAMUSCULAR | Status: DC | PRN

## 2012-08-24 MED ORDER — SODIUM CHLORIDE 0.9 % IJ SOLN
3.0000 mL | Freq: Two times a day (BID) | INTRAMUSCULAR | Status: DC
  Administered 2012-08-25 (×2): 3 mL via INTRAVENOUS

## 2012-08-24 NOTE — ED Notes (Signed)
Advised patient to let me know ASAP if she needs to urinate so we can assist her.

## 2012-08-24 NOTE — ED Notes (Signed)
Pt couldn't stand.5:23pm JG.

## 2012-08-24 NOTE — ED Notes (Signed)
Tech reports that pt is refusing to take lasix. Spoke with pt who reports, "I am under my own doctors care and I take 80 mg of Lasix in the morning and 40 mg of lasix at night." Pt took her morning dose of Lasix today. Explained to pt about her blood work BNP and that the level is elevated. Pt requests to talk with ED physician. Called Dr. Ranae Palms to informed of same.

## 2012-08-24 NOTE — ED Provider Notes (Signed)
History     CSN: [id number]  Arrival date & time 08/24/12  1559   First MD Initiated Contact with Patient 08/24/12 1606      Chief Complaint  Patient presents with  . Weakness    (Consider location/radiation/quality/duration/timing/severity/associated sxs/prior treatment) HPI Pt p/w roughly 2 weeks of lower ext swelling, SOB with exertion and generalized weakness. No fever, chills, CP, abd pain, N/V/D. No recent change in medication. Pt lives in house with her son.  Past Medical History  Diagnosis Date  . CAD (coronary artery disease)   . Gout   . Hyperlipemia   . Hypertension     Past Surgical History  Procedure Laterality Date  . Abdominal surgery    . Replacement total knee Left   . Total shoulder replacement Right   . Appendectomy    . Cholecystectomy      No family history on file.  History  Substance Use Topics  . Smoking status: Never Smoker   . Smokeless tobacco: Not on file  . Alcohol Use: No    OB History   Grav Para Term Preterm Abortions TAB SAB Ect Mult Living                  Review of Systems  Constitutional: Positive for fatigue. Negative for fever and chills.  HENT: Negative for neck pain and neck stiffness.   Respiratory: Positive for shortness of breath. Negative for cough and wheezing.   Cardiovascular: Positive for leg swelling. Negative for chest pain and palpitations.  Gastrointestinal: Negative for nausea, vomiting, abdominal pain and diarrhea.  Genitourinary: Negative for dysuria.  Musculoskeletal: Negative for myalgias and back pain.  Skin: Negative for rash and wound.  Neurological: Positive for dizziness, weakness and light-headedness. Negative for numbness and headaches.  All other systems reviewed and are negative.    Allergies  Prinivil  Home Medications   No current outpatient prescriptions on file.  BP 133/99  Pulse 103  Temp(Src) 98.1 F (36.7 C) (Oral)  Resp 20  SpO2 98%  Physical Exam  Nursing note and  vitals reviewed. Constitutional: She is oriented to person, place, and time. She appears well-developed and well-nourished. No distress.  HENT:  Head: Normocephalic and atraumatic.  Mouth/Throat: Oropharynx is clear and moist.  Eyes: EOM are normal. Pupils are equal, round, and reactive to light.  Neck: Normal range of motion. Neck supple. JVD present.  Cardiovascular: Regular rhythm.   Bradycardia   Pulmonary/Chest: Effort normal. No respiratory distress. She has no wheezes. She has rales (faint rale bl bases).  Abdominal: Soft. Bowel sounds are normal. She exhibits no mass. There is no tenderness. There is no rebound and no guarding.  Musculoskeletal: Normal range of motion. She exhibits edema (3+ edema R >L  lower ext). She exhibits no tenderness.  Neurological: She is alert and oriented to person, place, and time.  5/5 motor in all ext, sensation intact  Skin: Skin is warm and dry. No rash noted. No erythema.  Psychiatric: She has a normal mood and affect. Her behavior is normal.    ED Course  Procedures (including critical care time)  Labs Reviewed  CBC WITH DIFFERENTIAL - Abnormal; Notable for the following:    RBC 2.82 (*)    Hemoglobin 8.2 (*)    HCT 24.8 (*)    Eosinophils Relative 6 (*)    All other components within normal limits  COMPREHENSIVE METABOLIC PANEL - Abnormal; Notable for the following:    Sodium 131 (*)  Glucose, Bld 196 (*)    BUN 54 (*)    Creatinine, Ser 3.59 (*)    Calcium 8.1 (*)    Total Protein 5.9 (*)    Albumin 3.0 (*)    Total Bilirubin 0.1 (*)    GFR calc non Af Amer 11 (*)    GFR calc Af Amer 12 (*)    All other components within normal limits  PRO B NATRIURETIC PEPTIDE - Abnormal; Notable for the following:    Pro B Natriuretic peptide (BNP) 16719.0 (*)    All other components within normal limits  URINE CULTURE  TROPONIN I  URINALYSIS, ROUTINE W REFLEX MICROSCOPIC  COMPREHENSIVE METABOLIC PANEL  CBC  TROPONIN I  TROPONIN I   TROPONIN I  T4, FREE  HEMOGLOBIN A1C  PRO B NATRIURETIC PEPTIDE   Dg Chest Port 1 View  08/24/2012   *RADIOLOGY REPORT*  Clinical Data: Dizziness with history of diabetes.  PORTABLE CHEST - 1 VIEW  Comparison: 06/22/2009.  Findings: Cardiomegaly.  Small right effusion.  Mild right basilar atelectasis.  No infiltrates or failure.  Right shoulder hemiarthroplasty.  Vascular calcification of the aorta.  Slight worsening aeration from priors.  IMPRESSION: Cardiomegaly.  Small right effusion.  Mild right basilar atelectasis.  No infiltrates or overt failure.   Original Report Authenticated By: Davonna Belling, M.D.     1. CHF exacerbation   2. Renal failure   3. Acute right-sided heart failure   4. Acute-on-chronic renal failure   5. Anemia, unspecified   6. Dizziness and giddiness   7. Edema   8. Type II or unspecified type diabetes mellitus without mention of complication, not stated as uncontrolled      Date: 08/24/2012  Rate: 48  Rhythm: sinus bradycardia  QRS Axis: normal  Intervals: PR prolonged  ST/T Wave abnormalities: nonspecific T wave changes  Conduction Disutrbances:first-degree A-V block   Narrative Interpretation:   Old EKG Reviewed: none available    MDM   Discussed with [facility] and will admit.         Loren Racer, MD 08/24/12 [phone]

## 2012-08-24 NOTE — ED Notes (Signed)
Spoke with pt re: plan of care. Pt report, "I will come back here Tuesday or Wednesday." Upon further conversation, spoke with pt about her condition of excess fluid and that she could received care here in hospital. Son at bedside attempting to talk into staying. Pt reports, "you may get the fluid all off and then when I go home, it will just come back." Talked with pt re: diet and pt admits use of Salt in her diet. Also spoke with pt about low heart rate (40's), her being weak and edema in her B/L feet. Pt now wants to stay overnight in hospital to get treatment. Incoming RN made aware and was present for some of the conversation.

## 2012-08-24 NOTE — ED Notes (Signed)
MD at bedside. 

## 2012-08-24 NOTE — H&P (Addendum)
[name] [name] is an 79 y.o. female.         [facility] ADMISSION H&P   Chief Complaint: dizziness and weakness  HPI: 87 yr. Old AAF w/ pmhx significant for CAD, NIDDM type 2, CKD stage 4, hx reported MI, HTN, HL, OA, presents due to weakness and dizziness. She presents with her son who gives part of the history. She states over the past week she has felt increasingly weak to the point where she gets easily fatigued with ambulation. She admits her LE are more swollen. She denies any PND or worsening orthopnea. She states that about 1-2 weeks ago she was having increasing nocturia and her PCP prescribed her oxybutynin to help control this. Since then she noted she is not getting up to urinate as much at night.   Her son is concerned because she can normally ambulate with a cane and some assistance and now she has trouble getting out of bed.  She is noted to be bradycardic in the ED but hemodynamically stable. She denies any current dizziness. Denies any current CP or SOB, but she is noted to be on 3 L [place] O2.  She is noted to have a Cr above her baseline of 3.0 (3.59) and a proBNP of 16,000.  CXR without any evidence of pulmonary edema. EKG without any concerning ST changes.  ROS: Complete 12 point ROS otherwise negative except for that stated in the HPI.  Past Medical History  Diagnosis Date  . CAD (coronary artery disease)   . Gout   . Hyperlipemia   . Hypertension     Past Surgical History  Procedure Laterality Date  . Abdominal surgery    . Replacement total knee Left   . Total shoulder replacement Right   . Appendectomy    . Cholecystectomy      No family history on file. Social History:  reports that she has never smoked. She does not have any smokeless tobacco history on file. She reports that she does not drink alcohol. Her drug history is not on file.  Allergies:  Allergies  Allergen Reactions  . Prinivil (Lisinopril)     Pt reports unaware of allergy      (Not in a hospital admission)  Results for orders placed during the hospital encounter of 08/24/12 (from the past 48 hour(s))  CBC WITH DIFFERENTIAL     Status: Abnormal   Collection Time    08/24/12  4:40 PM      Result Value Range   WBC 7.4  4.0 - 10.5 K/uL   RBC 2.82 (*) 3.87 - 5.11 MIL/uL   Hemoglobin 8.2 (*) 12.0 - 15.0 g/dL   HCT 13.0 (*) 86.5 - 78.4 %   MCV 87.9  78.0 - 100.0 fL   MCH 29.1  26.0 - 34.0 pg   MCHC 33.1  30.0 - 36.0 g/dL   RDW 69.6  29.5 - 28.4 %   Platelets 165  150 - 400 K/uL   Neutrophils Relative % 63  43 - 77 %   Neutro Abs 4.7  1.7 - 7.7 K/uL   Lymphocytes Relative 24  12 - 46 %   Lymphs Abs 1.8  0.7 - 4.0 K/uL   Monocytes Relative 6  3 - 12 %   Monocytes Absolute 0.5  0.1 - 1.0 K/uL   Eosinophils Relative 6 (*) 0 - 5 %   Eosinophils Absolute 0.4  0.0 - 0.7 K/uL   Basophils Relative 0  0 -  1 %   Basophils Absolute 0.0  0.0 - 0.1 K/uL  COMPREHENSIVE METABOLIC PANEL     Status: Abnormal   Collection Time    08/24/12  4:40 PM      Result Value Range   Sodium 131 (*) 135 - 145 mEq/L   Potassium 5.1  3.5 - 5.1 mEq/L   Chloride 99  96 - 112 mEq/L   CO2 20  19 - 32 mEq/L   Glucose, Bld 196 (*) 70 - 99 mg/dL   BUN 54 (*) 6 - 23 mg/dL   Creatinine, Ser 1.61 (*) 0.50 - 1.10 mg/dL   Calcium 8.1 (*) 8.4 - 10.5 mg/dL   Total Protein 5.9 (*) 6.0 - 8.3 g/dL   Albumin 3.0 (*) 3.5 - 5.2 g/dL   AST 11  0 - 37 U/L   ALT 5  0 - 35 U/L   Alkaline Phosphatase 81  39 - 117 U/L   Total Bilirubin 0.1 (*) 0.3 - 1.2 mg/dL   GFR calc non Af Amer 11 (*) >90 mL/min   GFR calc Af Amer 12 (*) >90 mL/min   Comment:            The eGFR has been calculated     using the CKD EPI equation.     This calculation has not been     validated in all clinical     situations.     eGFR's persistently     <90 mL/min signify     possible Chronic Kidney Disease.  TROPONIN I     Status: None   Collection Time    08/24/12  4:41 PM      Result Value Range   Troponin I <0.30   <0.30 ng/mL   Comment:            Due to the release kinetics of cTnI,     a negative result within the first hours     of the onset of symptoms does not rule out     myocardial infarction with certainty.     If myocardial infarction is still suspected,     repeat the test at appropriate intervals.  PRO B NATRIURETIC PEPTIDE     Status: Abnormal   Collection Time    08/24/12  4:41 PM      Result Value Range   Pro B Natriuretic peptide (BNP) 16719.0 (*) 0 - 450 pg/mL   Dg Chest Port 1 View  08/24/2012   *RADIOLOGY REPORT*  Clinical Data: Dizziness with history of diabetes.  PORTABLE CHEST - 1 VIEW  Comparison: 06/22/2009.  Findings: Cardiomegaly.  Small right effusion.  Mild right basilar atelectasis.  No infiltrates or failure.  Right shoulder hemiarthroplasty.  Vascular calcification of the aorta.  Slight worsening aeration from priors.  IMPRESSION: Cardiomegaly.  Small right effusion.  Mild right basilar atelectasis.  No infiltrates or overt failure.   Original Report Authenticated By: Davonna Belling, M.D.    Review of Systems  Constitutional: Positive for malaise/fatigue. Negative for fever, chills, weight loss and diaphoresis.  Respiratory: Positive for shortness of breath. Negative for cough, sputum production and wheezing.   Cardiovascular: Positive for leg swelling. Negative for chest pain, palpitations, orthopnea and PND.  Gastrointestinal: Positive for constipation. Negative for nausea, vomiting, abdominal pain, diarrhea, blood in stool and melena.  Genitourinary: Positive for frequency. Negative for dysuria, urgency and hematuria.  Neurological: Positive for dizziness and weakness. Negative for sensory change, speech change, focal weakness, loss of  consciousness and headaches.  Psychiatric/Behavioral: Negative for depression.    Blood pressure 118/53, pulse 46, temperature 98.1 F (36.7 C), temperature source Oral, resp. rate 19, SpO2 100.00%. Physical Exam  Constitutional: She  is oriented to person, place, and time. She appears well-developed and well-nourished. No distress.  HENT:  Head: Normocephalic and atraumatic.  Eyes: Conjunctivae and EOM are normal. Pupils are equal, round, and reactive to light.  Neck: Normal range of motion. Neck supple. JVD present. Edema present. No thyromegaly present.  Cardiovascular: Regular rhythm, S1 normal, S2 normal and intact distal pulses.  Bradycardia present.  Exam reveals no gallop and no friction rub.   Murmur heard.  Crescendo decrescendo systolic murmur is present with a grade of 4/6  3+ bilat LE pitting edema  Respiratory: Effort normal. She has no wheezes. She has no rales.  GI: Bowel sounds are normal. She exhibits no distension. There is no tenderness. There is no rebound.  Musculoskeletal: She exhibits edema.  Neurological: She is alert and oriented to person, place, and time. She has normal reflexes. No cranial nerve deficit.  Skin: Skin is warm. She is not diaphoretic.  bilat LE chronic venous stasis changes  Psychiatric: She has a normal mood and affect. Her behavior is normal.     Assessment/Plan 87 yr. Old AAF w/ pmhx significant for CAD, NIDDM type 2, CKD stage 4, hx reported MI, HTN, HL, OA, presents due to weakness and dizziness. 1) Acute on chronic RHF: Previous echo from 2011 showed a normal EF but likely some RV dysfunction. Given her worsening LE edema, SOB, and significantly elevated proBNP, tachypnea,  as well as hypervolemia on exam, I will begin Lasix 80 mg IV bid. Strict I&Os. Daily weights. A TTE will be ordered for the morning to re-evaluate her RV/LV function. CE's x 3 will be obtained. She states she has seen [facility] cards in the past, they can be consulted in the morning if needed. Hold BB when bradycardic.  2) Generalize weakness: The addition of oxybutynin may have led to some of her symptoms, including fatigue and dizziness as well as possibly some urinary retention. I will have the RN insert a  foley catheter while she is being diuresed to assure no retention and accurate I&O's. 3) NIDDM type 2: SSI for now. Hold glipizide. 4) AKI on CKD 4: Likely some cardiorenal component. Repeat Cr in the AM and follow daily, hopefully this will improve with diuresis. The next concern is hydronephrosis for urinary retention, but this should resolve with foley catheter placement and d/c of oxybutynin.  5) Anemia: Normocytic likely due to CKD. I don't see any evidence of clinical bleeding. Follow CBC, likely will increase with diuresis. She needs consideration of ESA. 6) FEN: Low sodium diet. 7) Proph: Heparin. 8) CODE: FULL.  Case discussed with son at bedside. All questions answered.  Jonah Blue 08/24/2012, 8:37 PM

## 2012-08-24 NOTE — ED Notes (Signed)
Received pt from home with c/o weakness ongoing x 2 weeks. Pt has swelling to B/L feet also that has been ongoing. Family reports that pt has been progressively getting weaker this past week. Pt usually can walk around with a cane but has not been able to all week. Pt does not have air conditioning in her home.

## 2012-08-24 NOTE — ED Notes (Signed)
Report attempted 

## 2012-08-25 ENCOUNTER — Encounter (HOSPITAL_COMMUNITY): Payer: Self-pay | Admitting: Physician Assistant

## 2012-08-25 DIAGNOSIS — D649 Anemia, unspecified: Secondary | ICD-10-CM | POA: Diagnosis present

## 2012-08-25 DIAGNOSIS — E119 Type 2 diabetes mellitus without complications: Secondary | ICD-10-CM | POA: Diagnosis present

## 2012-08-25 DIAGNOSIS — I498 Other specified cardiac arrhythmias: Secondary | ICD-10-CM

## 2012-08-25 DIAGNOSIS — R001 Bradycardia, unspecified: Secondary | ICD-10-CM

## 2012-08-25 DIAGNOSIS — I5032 Chronic diastolic (congestive) heart failure: Secondary | ICD-10-CM

## 2012-08-25 DIAGNOSIS — N184 Chronic kidney disease, stage 4 (severe): Secondary | ICD-10-CM | POA: Diagnosis present

## 2012-08-25 LAB — COMPREHENSIVE METABOLIC PANEL
BUN: 56 mg/dL — ABNORMAL HIGH (ref 6–23)
CO2: 23 mEq/L (ref 19–32)
Calcium: 8.3 mg/dL — ABNORMAL LOW (ref 8.4–10.5)
Creatinine, Ser: 3.45 mg/dL — ABNORMAL HIGH (ref 0.50–1.10)
GFR calc Af Amer: 13 mL/min — ABNORMAL LOW (ref >90)
GFR calc non Af Amer: 11 mL/min — ABNORMAL LOW (ref >90)
Glucose, Bld: 61 mg/dL — ABNORMAL LOW (ref 70–99)
Sodium: 132 mEq/L — ABNORMAL LOW (ref 135–145)
Total Protein: 5.8 g/dL — ABNORMAL LOW (ref 6.0–8.3)

## 2012-08-25 LAB — URINALYSIS, ROUTINE W REFLEX MICROSCOPIC
Glucose, UA: NEGATIVE mg/dL
Leukocytes, UA: NEGATIVE
Protein, ur: NEGATIVE mg/dL
Specific Gravity, Urine: 1.008 (ref 1.005–1.030)
Urobilinogen, UA: 0.2 mg/dL (ref 0.0–1.0)

## 2012-08-25 LAB — CBC
HCT: 26.1 % — ABNORMAL LOW (ref 36.0–46.0)
Hemoglobin: 8.5 g/dL — ABNORMAL LOW (ref 12.0–15.0)
MCHC: 32.6 g/dL (ref 30.0–36.0)
RBC: 2.96 MIL/uL — ABNORMAL LOW (ref 3.87–5.11)
WBC: 6.8 10*3/uL (ref 4.0–10.5)

## 2012-08-25 LAB — TROPONIN I: Troponin I: <0.3 ng/mL (ref <0.30)

## 2012-08-25 LAB — GLUCOSE, CAPILLARY
Glucose-Capillary: 65 mg/dL — ABNORMAL LOW (ref 70–99)
Glucose-Capillary: 86 mg/dL (ref 70–99)
Glucose-Capillary: 73 mg/dL (ref 70–99)

## 2012-08-25 LAB — PRO B NATRIURETIC PEPTIDE: Pro B Natriuretic peptide (BNP): 17457 pg/mL — ABNORMAL HIGH (ref 0–450)

## 2012-08-25 LAB — T4, FREE: Free T4: 0.82 ng/dL (ref 0.80–1.80)

## 2012-08-25 LAB — RETICULOCYTES
RBC.: 2.81 MIL/uL — ABNORMAL LOW (ref 3.87–5.11)
Retic Ct Pct: 2.3 % (ref 0.4–3.1)

## 2012-08-25 MED ORDER — CARVEDILOL 6.25 MG PO TABS
6.2500 mg | ORAL_TABLET | Freq: Two times a day (BID) | ORAL | Status: DC
  Administered 2012-08-27: 6.25 mg via ORAL
  Filled 2012-08-25 (×5): qty 1

## 2012-08-25 MED ORDER — INSULIN ASPART 100 UNIT/ML ~~LOC~~ SOLN: 0.0000 [IU] | Freq: Three times a day (TID) | SUBCUTANEOUS | Status: DC

## 2012-08-25 MED ORDER — CARVEDILOL 6.25 MG PO TABS
6.2500 mg | ORAL_TABLET | Freq: Two times a day (BID) | ORAL | Status: DC
  Filled 2012-08-25 (×2): qty 1

## 2012-08-25 MED ORDER — FUROSEMIDE 10 MG/ML IJ SOLN: 40.0000 mg | Freq: Two times a day (BID) | INTRAMUSCULAR | Status: DC

## 2012-08-25 MED ORDER — FUROSEMIDE 10 MG/ML IJ SOLN
80.0000 mg | Freq: Two times a day (BID) | INTRAMUSCULAR | Status: DC
  Administered 2012-08-25 – 2012-08-27 (×4): 80 mg via INTRAVENOUS
  Filled 2012-08-25 (×5): qty 8

## 2012-08-25 NOTE — Consult Note (Signed)
Referring Physician:  Primary Physician: Laurena Slimmer, MD Primary Cardiologist: [facility] saw in [facility] Reason for Consultation: CHF  HPI: [name] [name] is an 79 year old female with a history of coronary artery disease, HTN, DM2 and chronic kidney disease, stage 4. We are asked to see her by Dr. Thedore Mins for management of RHF.   She has been previously followed by Dr. Jens Som in our [place] office.Her cardiac history dates back to August of 1999. At that time, she presented with a myocardial infarction. She underwent cardiac catheterization. She was found to have a normal left main. There was no obstructive disease in the LAD. There was 60% to 70% second marginal. There was a 90% proximal right coronary artery followed by an 80% stenosis. 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 At that time, the patient had a PCI of her right coronary artery. Most recent Myoview was performed on December 11, 2006. She was found to have an ejection fraction of 63%. There is a small scar at the base of the inferior wall but no ischemia. Last echo 3/11. EF 60%. With some question of RV dysfunction though RV not seen well  Over the past week prior to admission, she has become very weak and says her ability to ambulate has decreased markedly. Her son is concerned because she can normally ambulate with a cane and some assistance and now she has trouble getting out of bed.  She denies CP or SOB. He came to the hospital for evaluation. CXR was clear. Cr up from 3.0-> 3.6. BNP 17K. ECG with profound sinus bradycardia at 48 bpm (on carvedilol 25 bid). UA ok. She states her legs swell every day but go down overnight and are "flat" in the am.  Review of Systems:     Cardiac Review of Systems: {Y] = yes [ ]  = no  Chest Pain [    ]  Resting SOB [   ] Exertional SOB  [ y ]  Orthopnea [ y ]   Pedal Edema [  y  ]    Palpitations [  ] Syncope  [  ]   Presyncope [   ]  General Review of Systems: [Y] = yes [  ]=no Constitional: recent weight change [  ]; anorexia [  ]; fatigue [ y ]; nausea [  ]; night sweats [  ]; fever [  ]; or chills [  ];                                                                                                                                          Dental: poor dentition[ y ];   Eye : blurred vision [  ]; diplopia [   ]; vision changes [  ];  Amaurosis fugax[  ];  Resp: cough [  ];  wheezing[  ];  hemoptysis[  ]; shortness of breath[y  ]; paroxysmal nocturnal dyspnea[  ]; dyspnea on exertion[ y ]; or orthopnea[ y ];  GI:  gallstones[  ], vomiting[  ];  dysphagia[  ]; melena[  ];  hematochezia [  ]; heartburn[  ];   GU: kidney stones [  ]; hematuria[  ];   dysuria [  ];  nocturia[  ];  history of     obstruction [  ];                 Skin: rash, swelling[ y ];, hair loss[  ];  peripheral edema[  y];  or itching[  ]; Musculosketetal: myalgias[  ];  joint swelling[  ];  joint erythema[  ];  joint pain[  ];  back pain[  ];  Heme/Lymph: bruising[  ];  bleeding[  ];  anemia[  ];  Neuro: TIA[  ];  headaches[  ];  stroke[  ];  vertigo[  ];  seizures[  ];   paresthesias[  ];  difficulty walking[y  ];  Psych:depression[  ]; anxiety[  ];  Endocrine: diabetes[y  ];  thyroid dysfunction[  ];  Other:  Past Medical History  Diagnosis Date  . CAD (coronary artery disease)   . Gout   . Hyperlipemia   . Hypertension   . Diabetes mellitus without complication   . MI (myocardial infarction) August 1999    PCI RCA  . Chronic kidney disease, stage IV (severe)   . Anemia    Past Surgical History  Procedure Laterality Date  . Abdominal surgery    . Replacement total knee Left   . Total shoulder replacement Right   . Appendectomy    . Cholecystectomy    . Cardiac catheterization  1999    LAD OK, OM2 70%, RCA 90/80% with spont dissection, S/P PCI RCA    Medications Prior to  Admission  Medication Sig Dispense Refill  . allopurinol (ZYLOPRIM) 100 MG tablet Take 100 mg by mouth daily.      Marland Kitchen amLODipine (NORVASC) 5 MG tablet Take 5 mg by mouth daily.        Marland Kitchen aspirin EC 81 MG tablet Take 81 mg by mouth daily.      . carvedilol (COREG) 25 MG tablet Take 25 mg by mouth 2 (two) times daily with a meal.        . furosemide (LASIX) 40 MG tablet Take 40 mg by mouth every evening.      . furosemide (LASIX) 80 MG tablet Take 80 mg by mouth every morning.      Marland Kitchen glipiZIDE (GLUCOTROL XL) 2.5 MG 24 hr tablet Take 2.5 mg by mouth daily.      Marland Kitchen glucose 4 GM chewable tablet Chew 16 g by mouth as needed for low blood sugar.      . levothyroxine (SYNTHROID, LEVOTHROID) 50 MCG tablet Take 50 mcg by mouth daily before breakfast.      . lubiprostone (AMITIZA) 8 MCG capsule Take 8 mcg by mouth daily as needed for constipation.      Marland Kitchen nystatin-triamcinolone (MYCOLOG II) cream Apply topically as needed.        Marland Kitchen oxybutynin (DITROPAN) 5 MG tablet Take 5 mg by mouth 2 (two) times daily.       Marland Kitchen allopurinol  100 mg Oral Daily  . amLODipine  5 mg Oral Daily  . carvedilol  25 mg Oral BID WC  .  docusate sodium  100 mg Oral BID  . furosemide  40 mg Intravenous Q12H  . heparin  5,000 Units Subcutaneous Q8H  . insulin aspart  0-9 Units Subcutaneous TID WC  . levothyroxine  50 mcg Oral QAC breakfast  . sodium chloride  3 mL Intravenous Q12H  . sodium chloride  3 mL Intravenous Q12H   Infusions:    Allergies  Allergen Reactions  . Prinivil (Lisinopril)     Pt reports unaware of allergy   Prior to Admission medications   Medication Sig Start Date End Date Taking? Authorizing Provider  allopurinol (ZYLOPRIM) 100 MG tablet Take 100 mg by mouth daily.   Yes Historical Provider, MD  amLODipine (NORVASC) 5 MG tablet Take 5 mg by mouth daily.     Yes Historical Provider, MD  aspirin EC 81 MG tablet Take 81 mg by mouth daily.   Yes Historical Provider, MD  carvedilol (COREG) 25 MG tablet Take  25 mg by mouth 2 (two) times daily with a meal.     Yes Historical Provider, MD  furosemide (LASIX) 40 MG tablet Take 40 mg by mouth every evening.   Yes Historical Provider, MD  furosemide (LASIX) 80 MG tablet Take 80 mg by mouth every morning.   Yes Historical Provider, MD  glipiZIDE (GLUCOTROL XL) 2.5 MG 24 hr tablet Take 2.5 mg by mouth daily.   Yes Historical Provider, MD  glucose 4 GM chewable tablet Chew 16 g by mouth as needed for low blood sugar.   Yes Historical Provider, MD  levothyroxine (SYNTHROID, LEVOTHROID) 50 MCG tablet Take 50 mcg by mouth daily before breakfast.   Yes Historical Provider, MD  lubiprostone (AMITIZA) 8 MCG capsule Take 8 mcg by mouth daily as needed for constipation.   Yes Historical Provider, MD  nystatin-triamcinolone (MYCOLOG II) cream Apply topically as needed.     Yes Historical Provider, MD  oxybutynin (DITROPAN) 5 MG tablet Take 5 mg by mouth 2 (two) times daily.   Yes Historical Provider, MD    History   Social History  . Marital Status: Widowed    Spouse Name: N/A    Number of Children: N/A  . Years of Education: N/A   Occupational History  . Retired    Social History Main Topics  . Smoking status: Never Smoker   . Smokeless tobacco: Not on file  . Alcohol Use: No  . Drug Use: No  . Sexually Active: Not on file   Other Topics Concern  . Not on file   Social History Narrative   Her son helps care for her. There is reportedly a family history of coronary artery disease but no further details are available.    Family History  Problem Relation Age of Onset  . Arthritis Mother     Died at age 75  . Arthritis Sister    Family Status  Relation Status Death Age  . Mother Deceased   . Father Deceased     PHYSICAL EXAM: Filed Vitals:   08/25/12 1426  BP: 105/89  Pulse: 46  Temp: 98.3 F (36.8 C)  Resp: 20     Intake/Output Summary (Last 24 hours) at 08/25/12 1437 Last data filed at 08/25/12 1144  Gross per 24 hour  Intake       3 ml  Output   1075 ml  Net  -1072 ml   General:  Elderly. Weak appearing. Requires 2 person full assist to go from bed to chair No respiratory difficulty  HEENT: normal Neck: supple. JVP 10, about 10 cm. Carotids 2+ bilat; bilateral bruits, likely secondary to radiation of murmur. No lymphadenopathy or thryomegaly appreciated. Cor: PMI nondisplaced. Huston Foley and regular. No rubs, gallops; 2/6 murmur RSB to carotids Lungs: Decreased breath sounds bilateral bases, right greater than left Abdomen: Obese, soft, nontender, nondistended. No hepatosplenomegaly. No bruits or masses. Good bowel sounds. Extremities: no cyanosis, clubbing, rash, 2+ edema Neuro: alert & oriented x 2, cranial nerves grossly intact. moves all 4 extremities w/o difficulty. Affect pleasant.  ECG: Sinus bradycardia, rate 48, nonspecific ST and T wave changes; similar to 2010 ECGs  ECHO: 05/23/2009 Study Conclusions - Left ventricle: The cavity size was normal. Wall thickness was normal. The estimated ejection fraction was 60%. Wall motion was normal; there were no regional wall motion abnormalities. - Aortic valve: Sclerosis without stenosis. - Left atrium: The atrium was mildly dilated. - Right ventricle: The RV is not seen well. There is suggestion of some RV dysfunction. - Right atrium: The atrium was mildly dilated.  Results for orders placed during the hospital encounter of 08/24/12 (from the past 24 hour(s))  CBC WITH DIFFERENTIAL     Status: Abnormal   Collection Time    08/24/12  4:40 PM      Result Value Range   WBC 7.4  4.0 - 10.5 K/uL   RBC 2.82 (*) 3.87 - 5.11 MIL/uL   Hemoglobin 8.2 (*) 12.0 - 15.0 g/dL   HCT 40.9 (*) 81.1 - 91.4 %   MCV 87.9  78.0 - 100.0 fL   MCH 29.1  26.0 - 34.0 pg   MCHC 33.1  30.0 - 36.0 g/dL   RDW 78.2  95.6 - 21.3 %   Platelets 165  150 - 400 K/uL   Neutrophils Relative % 63  43 - 77 %   Neutro Abs 4.7  1.7 - 7.7 K/uL   Lymphocytes Relative 24  12 - 46 %   Lymphs Abs  1.8  0.7 - 4.0 K/uL   Monocytes Relative 6  3 - 12 %   Monocytes Absolute 0.5  0.1 - 1.0 K/uL   Eosinophils Relative 6 (*) 0 - 5 %   Eosinophils Absolute 0.4  0.0 - 0.7 K/uL   Basophils Relative 0  0 - 1 %   Basophils Absolute 0.0  0.0 - 0.1 K/uL  COMPREHENSIVE METABOLIC PANEL     Status: Abnormal   Collection Time    08/24/12  4:40 PM      Result Value Range   Sodium 131 (*) 135 - 145 mEq/L   Potassium 5.1  3.5 - 5.1 mEq/L   Chloride 99  96 - 112 mEq/L   CO2 20  19 - 32 mEq/L   Glucose, Bld 196 (*) 70 - 99 mg/dL   BUN 54 (*) 6 - 23 mg/dL   Creatinine, Ser 0.86 (*) 0.50 - 1.10 mg/dL   Calcium 8.1 (*) 8.4 - 10.5 mg/dL   Total Protein 5.9 (*) 6.0 - 8.3 g/dL   Albumin 3.0 (*) 3.5 - 5.2 g/dL   AST 11  0 - 37 U/L   ALT 5  0 - 35 U/L   Alkaline Phosphatase 81  39 - 117 U/L   Total Bilirubin 0.1 (*) 0.3 - 1.2 mg/dL   GFR calc non Af Amer 11 (*) >90 mL/min   GFR calc Af Amer 12 (*) >90 mL/min  TROPONIN I     Status: None   Collection Time  08/24/12  4:41 PM      Result Value Range   Troponin I <0.30  <0.30 ng/mL  PRO B NATRIURETIC PEPTIDE     Status: Abnormal   Collection Time    08/24/12  4:41 PM      Result Value Range   Pro B Natriuretic peptide (BNP) 16719.0 (*) 0 - 450 pg/mL  TROPONIN I     Status: None   Collection Time    08/24/12 11:40 PM      Result Value Range   Troponin I <0.30  <0.30 ng/mL  GLUCOSE, CAPILLARY     Status: None   Collection Time    08/24/12 11:54 PM      Result Value Range   Glucose-Capillary 86  70 - 99 mg/dL  COMPREHENSIVE METABOLIC PANEL     Status: Abnormal   Collection Time    08/25/12  4:00 AM      Result Value Range   Sodium 132 (*) 135 - 145 mEq/L   Potassium 4.3  3.5 - 5.1 mEq/L   Chloride 101  96 - 112 mEq/L   CO2 23  19 - 32 mEq/L   Glucose, Bld 61 (*) 70 - 99 mg/dL   BUN 56 (*) 6 - 23 mg/dL   Creatinine, Ser 4.09 (*) 0.50 - 1.10 mg/dL   Calcium 8.3 (*) 8.4 - 10.5 mg/dL   Total Protein 5.8 (*) 6.0 - 8.3 g/dL   Albumin 3.0 (*)  3.5 - 5.2 g/dL   AST 10  0 - 37 U/L   ALT 5  0 - 35 U/L   Alkaline Phosphatase 79  39 - 117 U/L   Total Bilirubin 0.2 (*) 0.3 - 1.2 mg/dL   GFR calc non Af Amer 11 (*) >90 mL/min   GFR calc Af Amer 13 (*) >90 mL/min  CBC     Status: Abnormal   Collection Time    08/25/12  4:00 AM      Result Value Range   WBC 6.8  4.0 - 10.5 K/uL   RBC 2.96 (*) 3.87 - 5.11 MIL/uL   Hemoglobin 8.5 (*) 12.0 - 15.0 g/dL   HCT 81.1 (*) 91.4 - 78.2 %   MCV 88.2  78.0 - 100.0 fL   MCH 28.7  26.0 - 34.0 pg   MCHC 32.6  30.0 - 36.0 g/dL   RDW 95.6  21.3 - 08.6 %   Platelets 159  150 - 400 K/uL  PRO B NATRIURETIC PEPTIDE     Status: Abnormal   Collection Time    08/25/12  4:00 AM      Result Value Range   Pro B Natriuretic peptide (BNP) 17457.0 (*) 0 - 450 pg/mL  T4, FREE     Status: None   Collection Time    08/25/12  4:00 AM      Result Value Range   Free T4 0.82  0.80 - 1.80 ng/dL  TROPONIN I     Status: None   Collection Time    08/25/12  4:10 AM      Result Value Range   Troponin I <0.30  <0.30 ng/mL  URINALYSIS, ROUTINE W REFLEX MICROSCOPIC     Status: Abnormal   Collection Time    08/25/12  4:40 AM      Result Value Range   Color, Urine YELLOW  YELLOW   APPearance HAZY (*) CLEAR   Specific Gravity, Urine 1.008  1.005 - 1.030   pH 5.0  5.0 - 8.0   Glucose, UA NEGATIVE  NEGATIVE mg/dL   Hgb urine dipstick NEGATIVE  NEGATIVE   Bilirubin Urine NEGATIVE  NEGATIVE   Ketones, ur NEGATIVE  NEGATIVE mg/dL   Protein, ur NEGATIVE  NEGATIVE mg/dL   Urobilinogen, UA 0.2  0.0 - 1.0 mg/dL   Nitrite NEGATIVE  NEGATIVE   Leukocytes, UA NEGATIVE  NEGATIVE  GLUCOSE, CAPILLARY     Status: Abnormal   Collection Time    08/25/12  6:10 AM      Result Value Range   Glucose-Capillary 65 (*) 70 - 99 mg/dL  GLUCOSE, CAPILLARY     Status: None   Collection Time    08/25/12  7:09 AM      Result Value Range   Glucose-Capillary 73  70 - 99 mg/dL  CREATININE, URINE, RANDOM     Status: None   Collection  Time    08/25/12  9:28 AM      Result Value Range   Creatinine, Urine 45.76    SODIUM, URINE, RANDOM     Status: None   Collection Time    08/25/12  9:28 AM      Result Value Range   Sodium, Ur 59    GLUCOSE, CAPILLARY     Status: Abnormal   Collection Time    08/25/12 11:07 AM      Result Value Range   Glucose-Capillary 110 (*) 70 - 99 mg/dL  TROPONIN I     Status: None   Collection Time    08/25/12 11:25 AM      Result Value Range   Troponin I <0.30  <0.30 ng/mL  VITAMIN B12     Status: None   Collection Time    08/25/12 11:30 AM      Result Value Range   Vitamin B-12 692  211 - 911 pg/mL  FOLATE     Status: None   Collection Time    08/25/12 11:30 AM      Result Value Range   Folate >20.0    IRON AND TIBC     Status: Abnormal   Collection Time    08/25/12 11:30 AM      Result Value Range   Iron 24 (*) 42 - 135 ug/dL   TIBC 161 (*) 096 - 045 ug/dL   Saturation Ratios 13 (*) 20 - 55 %   UIBC 156  125 - 400 ug/dL  FERRITIN     Status: None   Collection Time    08/25/12 11:30 AM      Result Value Range   Ferritin 86  10 - 291 ng/mL  RETICULOCYTES     Status: Abnormal   Collection Time    08/25/12 11:30 AM      Result Value Range   Retic Ct Pct 2.3  0.4 - 3.1 %   RBC. 2.81 (*) 3.87 - 5.11 MIL/uL   Retic Count, Manual 64.6  19.0 - 186.0 K/uL   Radiology:  Dg Chest Port 1 View 08/24/2012   *RADIOLOGY REPORT*  Clinical Data: Dizziness with history of diabetes.  PORTABLE CHEST - 1 VIEW  Comparison: 06/22/2009.  Findings: Cardiomegaly.  Small right effusion.  Mild right basilar atelectasis.  No infiltrates or failure.  Right shoulder hemiarthroplasty.  Vascular calcification of the aorta.  Slight worsening aeration from priors.  IMPRESSION: Cardiomegaly.  Small right effusion.  Mild right basilar atelectasis.  No infiltrates or overt failure.   Original Report Authenticated By: Davonna Belling, M.D.  ASSESSMENT: 1. Weakness 2. Chronic diastolic/RHF 3. Severe sinus  bradycardia 4. DM2 5. HTN 6. CAD 7. Acute on chronic renal failure class IV (GFR 11) 8. Anemia of chronic disease 9. Protein calorie malnutrition  Theodore Demark, PA-C 08/25/2012 2:37 PM Beeper [phone]  PLAN/DISCUSSION:  Ms. Billiot does have some mild diastolic/right HF but I do not think this is major part of her weakness. Given her worsening renal function I would be hesitant to push her diuresis too much. That said, can try 80 IV bid for one day and see how she responds but would be quick to back off if CR getting worse. Would also place TED hose. Will order repeat echo.   I think her bradycardia may be a more important part of her fatigue and we will cut carvedilol back to 6.25 bid and may have to stop completely.   Given her age and comorbidities -particularly her advancing renal dysfunction, I think it is important to have a clear discussion with her and her son about her end-of-life wishes as she is not a HD candidate.   We will follow but I don't suspect we will have much else to offer.   Daniel Bensimhon,MD 2:56 PM

## 2012-08-25 NOTE — Progress Notes (Addendum)
[facility]                                                                                Patient Demographics  [name] [name], is a 79 y.o. female, DOB - 01/06/1925, ZOX:[id number], [id number]  Admit date - 08/24/2012  Admitting Physician [name] Blue, DO  Outpatient Primary MD for the patient is [name] Slimmer, MD  LOS - 1   Chief Complaint  Patient presents with  . Weakness        Assessment & Plan    1) Generalize weakness: This is gradually progressive over several months to years, discontinue oxybutynin, initiate activity under the guidance of PT. Social work to evaluate for placement.      2) Acute on chronic HF:  Last echogram from 2011 shows a preserved EF and no diastolic dysfunction, will repeat echogram, certainly right heart component appears to be more than the left in the light of no rales and shortness of breath and only peripheral edema, Lasix IV dose adjusted, cardiology requested to see the patient.    3) NIDDM type 2: SSI for now. Hold glipizide.    No results found for this basename: HGBA1C    CBG (last 3)   Recent Labs  08/24/12 2354 08/25/12 0610 08/25/12 0709  GLUCAP 86 65* 73     4) AKI on CKD 4: baseline creat 3, Likely some cardiorenal component. Monitor creatinine with continued diuresis.     5) Anemia: Normocytic likely due to CKD. I don't see any evidence of clinical bleeding. Follow CBC, likely will increase with diuresis. Will check anemia panel.     6) Low sodium. Check urine sodium and aspirin allergy, serum osmolality, free water restriction, gentle diuresis and repeat BMP in the morning    7)Right leg swelling more than left. We'll check venous duplex.     Code Status: full  Family Communication: None present  Disposition Plan: To be decided   Procedures 2-D echogram, lower extremities vascular duplex   Consults  Cards   DVT Prophylaxis   Heparin    Lab Results  Component Value Date    PLT 159 08/25/2012    Medications  Scheduled Meds: . allopurinol  100 mg Oral Daily  . amLODipine  5 mg Oral Daily  . carvedilol  25 mg Oral BID WC  . docusate sodium  100 mg Oral BID  . furosemide  40 mg Intravenous Q12H  . heparin  5,000 Units Subcutaneous Q8H  . insulin aspart  0-9 Units Subcutaneous TID WC  . levothyroxine  50 mcg Oral QAC breakfast  . sodium chloride  3 mL Intravenous Q12H  . sodium chloride  3 mL Intravenous Q12H   Continuous Infusions:  PRN Meds:.sodium chloride, sodium chloride  Antibiotics     Anti-infectives   None       Time Spent in minutes  30   [name] [name] K M.D on 08/25/2012 at 9:08 AM  Between 7am to 7pm - Pager - [phone]  After 7pm go to [email or web address] - password [email or web address]  And look for the night coverage person covering for me after hours  [facility] Office  [phone]    Subjective:  [name] [name] today has, No headache, No chest pain, No abdominal pain - No Nausea, No new weakness tingling or numbness, No Cough - SOB.    Objective:   Filed Vitals:   08/24/12 2230 08/24/12 2340 08/25/12 0437 08/25/12 0835  BP: 133/99 139/71 140/62 108/53  Pulse: 103 48 55 48  Temp:  97.3 F (36.3 C) 97.2 F (36.2 C) 98.8 F (37.1 C)  TempSrc:  Oral Axillary Oral  Resp: 20 17 18 20   Height:  5\' 5"  (1.651 m)    Weight:  99.3 kg (218 lb 14.7 oz)    SpO2: 98% 100% 97% 100%    Wt Readings from Last 3 Encounters:  08/24/12 99.3 kg (218 lb 14.7 oz)  07/24/08 100.699 kg (222 lb)     Intake/Output Summary (Last 24 hours) at 08/25/12 0908 Last data filed at 08/25/12 0439  Gross per 24 hour  Intake      0 ml  Output    450 ml  Net   -450 ml    Exam Awake Alert, Oriented X 3, No new F.N deficits, Normal affect [place].AT,PERRAL Supple Neck,No JVD, No cervical lymphadenopathy appriciated.  Symmetrical Chest wall movement, Good air movement bilaterally, CTAB RRR,No Gallops,Rubs or new Murmurs, No Parasternal  Heave +ve B.Sounds, Abd Soft, Non tender, No organomegaly appriciated, No rebound - guarding or rigidity. No Cyanosis, Clubbing , 2+ R>L edema, No new Rash or bruise      Data Review   Micro Results No results found for this or any previous visit (from the past 240 hour(s)).  Radiology Reports Dg Chest Port 1 View  08/24/2012   *RADIOLOGY REPORT*  Clinical Data: Dizziness with history of diabetes.  PORTABLE CHEST - 1 VIEW  Comparison: 06/22/2009.  Findings: Cardiomegaly.  Small right effusion.  Mild right basilar atelectasis.  No infiltrates or failure.  Right shoulder hemiarthroplasty.  Vascular calcification of the aorta.  Slight worsening aeration from priors.  IMPRESSION: Cardiomegaly.  Small right effusion.  Mild right basilar atelectasis.  No infiltrates or overt failure.   Original Report Authenticated By: [name] [name], M.D.    CBC  Recent Labs Lab 08/24/12 1640 08/25/12 0400  WBC 7.4 6.8  HGB 8.2* 8.5*  HCT 24.8* 26.1*  PLT 165 159  MCV 87.9 88.2  MCH 29.1 28.7  MCHC 33.1 32.6  RDW 15.3 15.2  LYMPHSABS 1.8  --   MONOABS 0.5  --   EOSABS 0.4  --   BASOSABS 0.0  --     Chemistries   Recent Labs Lab 08/24/12 1640 08/25/12 0400  NA 131* 132*  K 5.1 4.3  CL 99 101  CO2 20 23  GLUCOSE 196* 61*  BUN 54* 56*  CREATININE 3.59* 3.45*  CALCIUM 8.1* 8.3*  AST 11 10  ALT 5 5  ALKPHOS 81 79  BILITOT 0.1* 0.2*   ------------------------------------------------------------------------------------------------------------------ estimated creatinine clearance is 13.9 ml/min (by C-G formula based on Cr of 3.45). ------------------------------------------------------------------------------------------------------------------ No results found for this basename: HGBA1C,  in the last 72 hours ------------------------------------------------------------------------------------------------------------------ No results found for this basename: CHOL, HDL, LDLCALC, TRIG,  CHOLHDL, LDLDIRECT,  in the last 72 hours ------------------------------------------------------------------------------------------------------------------ No results found for this basename: TSH, T4TOTAL, FREET3, T3FREE, THYROIDAB,  in the last 72 hours ------------------------------------------------------------------------------------------------------------------ No results found for this basename: VITAMINB12, FOLATE, FERRITIN, TIBC, IRON, RETICCTPCT,  in the last 72 hours  Coagulation profile No results found for this basename: INR, PROTIME,  in the last 168 hours  No results found for  this basename: DDIMER,  in the last 72 hours  Cardiac Enzymes  Recent Labs Lab 08/24/12 1641 08/24/12 2340 08/25/12 0410  TROPONINI <0.30 <0.30 <0.30   ------------------------------------------------------------------------------------------------------------------ No components found with this basename: POCBNP,

## 2012-08-25 NOTE — Progress Notes (Signed)
Patient's CBG 65 this am. Asymptomatic. Carbohydrates given resulting in CBG 79. Will continue to monitor Patient.

## 2012-08-25 NOTE — Evaluation (Signed)
Physical Therapy Evaluation Patient Details Name: [name] [name] MRN: [id number] DOB: 01/06/1925 Today's Date: 08/25/2012 Time: 1000-1018 PT Time Calculation (min): 18 min  PT Assessment / Plan / Recommendation Clinical Impression  Pt is a 79 y.o. female adm to [facility] due to bil LE weakness and edema worsening over the past 2-3 weeks. Pt presents with mobility deficits and generalized weakness in LEs. Pt to benefit from skilled PT to maximize functional mobility and increase independce and decrease caregiver burden. Pt will need HHPT upon acute D/C.  Pt and son are insisting that the pt will not go to SNF at this time. Will cont to f/u with pt to determine safest D/C plan pending on rehab progress.     PT Assessment  Patient needs continued PT services    Follow Up Recommendations  Home health PT;Supervision/Assistance - 24 hour (possible SNF; pending rehab progress)     Does the patient have the potential to tolerate intense rehabilitation      Barriers to Discharge None      Equipment Recommendations  None recommended by PT (may need wheelchair; pending progress)    Recommendations for Other Services OT consult   Frequency Min 3X/week    Precautions / Restrictions Precautions Precautions: Fall Restrictions Weight Bearing Restrictions: No   Pertinent Vitals/Pain "not too bad in my right leg today"       Mobility  Bed Mobility Bed Mobility: Supine to Sit;Sitting - Scoot to Edge of Bed Supine to Sit: 3: Mod assist;HOB elevated;With rails Sitting - Scoot to Edge of Bed: 4: Min assist;With rail Details for Bed Mobility Assistance: requires (A) to advance bil LEs to EOB and hips to EOB with bed pad; multimodal cues for hand placement and min (A) needed to bring trunk to upright position with bed pad  Transfers Transfers: Sit to Stand;Stand to Sit;Stand Pivot Transfers Sit to Stand: 1: +2 Total assist;From bed;With upper extremity assist Sit to Stand: Patient Percentage:  60% Stand to Sit: 1: +2 Total assist;To chair/3-in-1;With armrests;With upper extremity assist Stand to Sit: Patient Percentage: 60% Stand Pivot Transfers: 1: +2 Total assist;From elevated surface Stand Pivot Transfers: Patient Percentage: 50% Details for Transfer Assistance: pt requires increased time to c/o transfer; pt is fearful of falling; requires multimodal cues for sequencing and max encouragement; performed SPT to recliner with 2 person hand held (A); pt with flexed trunk when standing; requires (A) to pivot and shift weight to chair.  Ambulation/Gait Ambulation/Gait Assistance: Not tested (comment) Stairs: No Wheelchair Mobility Wheelchair Mobility: No    Exercises General Exercises - Lower Extremity Ankle Circles/Pumps: AROM;Both;10 reps;Supine   PT Diagnosis:    PT Problem List: Decreased strength;Decreased activity tolerance;Decreased balance;Decreased mobility;Decreased knowledge of use of DME;Pain PT Treatment Interventions: DME instruction;Gait training;Stair training;Functional mobility training;Therapeutic activities;Therapeutic exercise;Balance training;Neuromuscular re-education;Patient/family education   PT Goals Acute Rehab PT Goals PT Goal Formulation: With patient Time For Goal Achievement: 08/31/12 Potential to Achieve Goals: Good Pt will go Supine/Side to Sit: with modified independence PT Goal: Supine/Side to Sit - Progress: Goal set today Pt will go Sit to Supine/Side: with modified independence PT Goal: Sit to Supine/Side - Progress: Goal set today Pt will go Sit to Stand: with supervision PT Goal: Sit to Stand - Progress: Goal set today Pt will go Stand to Sit: with supervision PT Goal: Stand to Sit - Progress: Goal set today Pt will Ambulate: 16 - 50 feet;with supervision;with least restrictive assistive device PT Goal: Ambulate - Progress: Goal set today Pt  will Go Up / Down Stairs: 1-2 stairs;with min assist;with rail(s);with least restrictive  assistive device PT Goal: Up/Down Stairs - Progress: Goal set today  Visit Information  Last PT Received On: 08/25/12 Assistance Needed: +2    Subjective Data  Subjective: Pt lying supine with son present; agreeable to therapy  Patient Stated Goal: to go home    Prior Functioning  Home Living Lives With: Son Available Help at Discharge: Available 24 hours/day;Family Type of Home: House Home Access: Stairs to enter [employer] of Steps: 2-3 Entrance Stairs-Rails: Right Home Layout: One level Bathroom Shower/Tub: Engineer, manufacturing systems: Standard Bathroom Accessibility: Yes How Accessible: Accessible via walker Home Adaptive Equipment: Straight cane;Walker - four wheeled;Shower chair with back Additional Comments: Pt amb with SPC or rollator; household distances only Prior Function Level of Independence: Needs assistance Needs Assistance: Bathing;Dressing;Meal Prep;Light Housekeeping;Gait Bath: Moderate Meal Prep: Moderate Light Housekeeping: Total Gait Assistance: pt has required increased (A) the past 2-3 weeks due to weakness and bil LE edema  Able to Take Stairs?: Yes (with (A) from son) Driving: No Vocation: Retired Musician: No difficulties    Copywriter, advertising Arousal/Alertness: Awake/alert Behavior During Therapy: WFL for tasks assessed/performed Overall Cognitive Status: Within Functional Limits for tasks assessed    Extremity/Trunk Assessment Right Upper Extremity Assessment RUE ROM/Strength/Tone: Deficits RUE ROM/Strength/Tone Deficits: decreaed R UE ROM ~50% flex; c/o pain; WFL for ER/IR  RUE Sensation: WFL - Light Touch Left Upper Extremity Assessment LUE ROM/Strength/Tone: Deficits LUE ROM/Strength/Tone Deficits: AROM shoulder flex 120degrees; WFL for ER/IR LUE Sensation: WFL - Light Touch Right Lower Extremity Assessment RLE ROM/Strength/Tone: Deficits RLE ROM/Strength/Tone Deficits: grossly 2/5; limited by  edema  RLE Sensation: Deficits RLE Sensation Deficits: c/o numbness and tinglinging PRN  Left Lower Extremity Assessment LLE ROM/Strength/Tone: WFL for tasks assessed (grossly 4-/5) LLE Sensation: WFL - Light Touch Trunk Assessment Trunk Assessment: Kyphotic   Balance Balance Balance Assessed: Yes Static Sitting Balance Static Sitting - Balance Support: Bilateral upper extremity supported;Feet supported Static Sitting - Level of Assistance: 5: Stand by assistance Static Standing Balance Static Standing - Balance Support: Bilateral upper extremity supported;During functional activity Static Standing - Level of Assistance: 1: +2 Total assist  End of Session PT - End of Session Equipment Utilized During Treatment: Gait belt Activity Tolerance: Patient tolerated treatment well Patient left: in chair;with call bell/phone within reach;with family/visitor present Nurse Communication: Mobility status  GP     Donell Sievert, [place] [phone] 08/25/2012, 11:36 AM

## 2012-08-26 DIAGNOSIS — I359 Nonrheumatic aortic valve disorder, unspecified: Secondary | ICD-10-CM

## 2012-08-26 LAB — BASIC METABOLIC PANEL
CO2: 24 mEq/L (ref 19–32)
Calcium: 8.1 mg/dL — ABNORMAL LOW (ref 8.4–10.5)
Chloride: 103 mEq/L (ref 96–112)
Creatinine, Ser: 3.36 mg/dL — ABNORMAL HIGH (ref 0.50–1.10)
Glucose, Bld: 76 mg/dL (ref 70–99)
Sodium: 136 mEq/L (ref 135–145)

## 2012-08-26 LAB — GLUCOSE, CAPILLARY
Glucose-Capillary: 90 mg/dL (ref 70–99)
Glucose-Capillary: 94 mg/dL (ref 70–99)

## 2012-08-26 MED ORDER — SODIUM CHLORIDE 0.9 % IJ SOLN: 3.0000 mL | Freq: Two times a day (BID) | INTRAMUSCULAR | Status: DC

## 2012-08-26 NOTE — Progress Notes (Signed)
[facility]                                                                                Patient Demographics  [name] [name], is a 79 y.o. female, DOB - 01/06/1925, ZOX:[id number], WJX:[id number]  Admit date - 08/24/2012  Admitting Physician Jonah Blue, DO  Outpatient Primary MD for the patient is Laurena Slimmer, MD  LOS - 2   Chief Complaint  Patient presents with  . Weakness        Assessment & Plan    1) Generalize weakness: This is gradually progressive over several months to years, Coreg dose lowered due to bradycardia, H Rate and BP better, discontinued oxybutynin, initiate activity under the guidance of PT. Social work to evaluate for placement.      2) Acute on chronic HF:  Last echogram from 2011 shows a preserved EF and no diastolic dysfunction, will repeat echogram, certainly right heart component appears to be more than the left in the light of no rales and shortness of breath and only peripheral edema, Lasix IV dose per cardiology.    3) NIDDM type 2: ISS only.   Lab Results  Component Value Date   HGBA1C 5.4 08/25/2012    CBG (last 3)   Recent Labs  08/25/12 1601 08/25/12 2107 08/26/12 0638  GLUCAP 118* 164* 90     4) AKI on CKD 4: baseline creat 3, Likely some cardiorenal component. Monitor creatinine with continued diuresis.     5) Anemia: Normocytic likely due to CKD. I don't see any evidence of clinical bleeding. Follow CBC, likely will increase with diuresis. Will check anemia panel.     6) Low sodium. Due to chf, better with Lasix.   7)Right leg swelling more than left. We'll check venous duplex.     Code Status: full  Family Communication: None present  Disposition Plan: SNF   Procedures 2-D echogram, lower extremities vascular duplex   Consults  Cards   DVT Prophylaxis   Heparin    Lab Results  Component Value Date   PLT 159 08/25/2012    Medications  Scheduled Meds: . allopurinol  100 mg Oral  Daily  . amLODipine  5 mg Oral Daily  . carvedilol  6.25 mg Oral BID WC  . docusate sodium  100 mg Oral BID  . furosemide  80 mg Intravenous Q12H  . heparin  5,000 Units Subcutaneous Q8H  . levothyroxine  50 mcg Oral QAC breakfast  . sodium chloride  3 mL Intravenous Q12H  . [START ON 08/27/2012] sodium chloride  3 mL Intravenous Q12H   Continuous Infusions:  PRN Meds:.sodium chloride, sodium chloride  Antibiotics     Anti-infectives   None       Time Spent in minutes  30   Susa Raring K M.D on 08/26/2012 at 7:59 AM  Between 7am to 7pm - Pager - [phone]  After 7pm go to [email or web address] - password [email or web address]  And look for the night coverage person covering for me after hours  [facility] Office  [phone]    Subjective:   Chariah Bailey today has, No headache, No chest pain, No abdominal pain - No Nausea, No  new weakness tingling or numbness, No Cough - SOB.    Objective:   Filed Vitals:   08/25/12 1652 08/25/12 2121 08/26/12 0626 08/26/12 0657  BP: 111/51 129/52 124/50   Pulse: 48 51 59   Temp:  98.7 F (37.1 C) 99.2 F (37.3 C)   TempSrc:  Oral Oral   Resp:  20 20   Height:      Weight:   98.068 kg (216 lb 3.2 oz) 97.523 kg (215 lb)  SpO2: 100% 99% 95%     Wt Readings from Last 3 Encounters:  08/26/12 97.523 kg (215 lb)  07/24/08 100.699 kg (222 lb)     Intake/Output Summary (Last 24 hours) at 08/26/12 0759 Last data filed at 08/26/12 0656  Gross per 24 hour  Intake    363 ml  Output   2450 ml  Net  -2087 ml    Exam Awake Alert, Oriented X 3, No new F.N deficits, Normal affect [place].AT,PERRAL Supple Neck,No JVD, No cervical lymphadenopathy appriciated.  Symmetrical Chest wall movement, Good air movement bilaterally, CTAB RRR,No Gallops,Rubs or new Murmurs, No Parasternal Heave +ve B.Sounds, Abd Soft, Non tender, No organomegaly appriciated, No rebound - guarding or rigidity. No Cyanosis, Clubbing , 2+ R>L edema, No new Rash or  bruise      Data Review   Micro Results No results found for this or any previous visit (from the past 240 hour(s)).  Radiology Reports Dg Chest Port 1 View  08/24/2012   *RADIOLOGY REPORT*  Clinical Data: Dizziness with history of diabetes.  PORTABLE CHEST - 1 VIEW  Comparison: 06/22/2009.  Findings: Cardiomegaly.  Small right effusion.  Mild right basilar atelectasis.  No infiltrates or failure.  Right shoulder hemiarthroplasty.  Vascular calcification of the aorta.  Slight worsening aeration from priors.  IMPRESSION: Cardiomegaly.  Small right effusion.  Mild right basilar atelectasis.  No infiltrates or overt failure.   Original Report Authenticated By: Davonna Belling, M.D.    CBC  Recent Labs Lab 08/24/12 1640 08/25/12 0400  WBC 7.4 6.8  HGB 8.2* 8.5*  HCT 24.8* 26.1*  PLT 165 159  MCV 87.9 88.2  MCH 29.1 28.7  MCHC 33.1 32.6  RDW 15.3 15.2  LYMPHSABS 1.8  --   MONOABS 0.5  --   EOSABS 0.4  --   BASOSABS 0.0  --     Chemistries   Recent Labs Lab 08/24/12 1640 08/25/12 0400 08/26/12 0520  NA 131* 132* 136  K 5.1 4.3 4.3  CL 99 101 103  CO2 20 23 24   GLUCOSE 196* 61* 76  BUN 54* 56* 57*  CREATININE 3.59* 3.45* 3.36*  CALCIUM 8.1* 8.3* 8.1*  AST 11 10  --   ALT 5 5  --   ALKPHOS 81 79  --   BILITOT 0.1* 0.2*  --    ------------------------------------------------------------------------------------------------------------------ estimated creatinine clearance is 14.1 ml/min (by C-G formula based on Cr of 3.36). ------------------------------------------------------------------------------------------------------------------  Recent Labs  08/25/12 0400  HGBA1C 5.4   ------------------------------------------------------------------------------------------------------------------ No results found for this basename: CHOL, HDL, LDLCALC, TRIG, CHOLHDL, LDLDIRECT,  in the last 72  hours ------------------------------------------------------------------------------------------------------------------  Recent Labs  08/25/12 1130  TSH 1.854   ------------------------------------------------------------------------------------------------------------------  Recent Labs  08/25/12 1130  VITAMINB12 692  FOLATE >20.0  FERRITIN 86  TIBC 180*  IRON 24*  RETICCTPCT 2.3    Coagulation profile No results found for this basename: INR, PROTIME,  in the last 168 hours  No results found for this basename: DDIMER,  in the last 72 hours  Cardiac Enzymes  Recent Labs Lab 08/24/12 2340 08/25/12 0410 08/25/12 1125  TROPONINI <0.30 <0.30 <0.30   ------------------------------------------------------------------------------------------------------------------ No components found with this basename: POCBNP,

## 2012-08-26 NOTE — Progress Notes (Signed)
UR COMPLETED  

## 2012-08-26 NOTE — Progress Notes (Signed)
Chaquetta Schlottman, PTA [phone] 08/26/2012  

## 2012-08-26 NOTE — Progress Notes (Signed)
   Subjective:  Denies CP or dyspnea   Objective:  Filed Vitals:   08/25/12 1652 08/25/12 2121 08/26/12 0626 08/26/12 0657  BP: 111/51 129/52 124/50   Pulse: 48 51 59   Temp:  98.7 F (37.1 C) 99.2 F (37.3 C)   TempSrc:  Oral Oral   Resp:  20 20   Height:      Weight:   216 lb 3.2 oz (98.068 kg) 215 lb (97.523 kg)  SpO2: 100% 99% 95%     Intake/Output from previous day:  Intake/Output Summary (Last 24 hours) at 08/26/12 0751 Last data filed at 08/26/12 0656  Gross per 24 hour  Intake    363 ml  Output   2450 ml  Net  -2087 ml    Physical Exam: Physical exam: Well-developed chronically ill appearing in no acute distress.  Skin is warm and dry.  HEENT is normal.  Neck is supple.  Chest is clear to auscultation with normal expansion.  Cardiovascular exam is regular rate and rhythm. 2/6 systolic murmur Abdominal exam nontender or distended. No masses palpated. Extremities show 1+ edema. neuro grossly intact    Lab Results: Basic Metabolic Panel:  Recent Labs  16/10/96 0400 08/26/12 0520  NA 132* 136  K 4.3 4.3  CL 101 103  CO2 23 24  GLUCOSE 61* 76  BUN 56* 57*  CREATININE 3.45* 3.36*  CALCIUM 8.3* 8.1*   CBC:  Recent Labs  08/24/12 1640 08/25/12 0400  WBC 7.4 6.8  NEUTROABS 4.7  --   HGB 8.2* 8.5*  HCT 24.8* 26.1*  MCV 87.9 88.2  PLT 165 159   Cardiac Enzymes:  Recent Labs  08/24/12 2340 08/25/12 0410 08/25/12 1125  TROPONINI <0.30 <0.30 <0.30     Assessment/Plan:  1 right heart failure-the patient remains mildly volume overloaded. Renal function appears to be unchanged today. Await echocardiogram. Continue Lasix 80 mg IV twice a day. Recheck potassium and renal function tomorrow morning. We'll most likely can discharge tomorrow morning. 2 bradycardia-the patient's carvedilol has been decreased. Hopefully a higher heart rate will improve cardiac output and the patient's fatigue. We will follow on telemetry. 3 chronic stage IV kidney  disease-monitor renal function with diuresis.  4 coronary artery disease-resume aspirin. Add Pravachol 40 mg daily. 5 hypertension-continue present medications. Patient has multiple comorbidities. Would not pursue aggressive cardiac evaluation. Olga Millers 08/26/2012, 7:51 AM

## 2012-08-26 NOTE — Progress Notes (Signed)
  Echocardiogram 2D Echocardiogram has been performed.  Arvil Chaco 08/26/2012, 3:08 PM

## 2012-08-26 NOTE — Progress Notes (Signed)
Patient had taken short nap this morning and when she awaken, patient appeared somewhat disoriented. Went in to assess patient for she had called out with the call bell. She was not able to tell me where she was but when nurse told her, she remembered. Patient able to tell me her name and birthay. Patient is very alert and very talkative but just seemed different today. Therefore called Rapid Response nurse to assess. It appears that she may be experiencing some delirium for she is not in her own environment. Patient is currently eating lunch and watching television. Notified Dr. Thedore Mins of this issue. Will continue to monitor to end of shift.

## 2012-08-26 NOTE — Progress Notes (Signed)
Attending MD returned call and nurse was advised to get patient up to the chair and to open all the blinds and turn on lights in the room. By the time nurse and nurse tech went to get patient up to chair, physical therapy had already got patient to the chair. As soon as PT got patient in the chair, Echo department had arrived to perform patient's echo and therefore patient was transferred back to bed by PT. After patient has his echo, we will get patient back up to the chair. Will continue to monitor to end of shift.

## 2012-08-26 NOTE — Clinical Social Work Psychosocial (Signed)
     Clinical Social Work Department BRIEF PSYCHOSOCIAL ASSESSMENT 08/26/2012  Patient:  [name] [name], [name] [name]     Account Number:  [id number]     Admit date:  08/24/2012  Clinical Social Worker:  Tiburcio Pea  Date/Time:  08/26/2012 12:10 PM  Referred by:  RN  Date Referred:  08/25/2012 Referred for  SNF Placement   Other Referral:   Interview type:  Other - See comment Other interview type:   Patient and Son [name] [name]    PSYCHOSOCIAL DATA Living Status:  WITH ADULT CHILDREN Admitted from facility:   Level of care:   Primary support name:  Jacques Navy Primary support relationship to patient:  CHILD, ADULT Degree of support available:   Strong support    CURRENT CONCERNS Current Concerns  None Noted   Other Concerns:    SOCIAL WORK ASSESSMENT / PLAN CSW was referred to assist patient with short term SNF placement per recommendation of PT.  CSW met with patient and her son [name] [name] regarding this issue- patient immedicately stated "I'm not going to a nursing home- I'm going home."  Her son supports this fully stating that he would be concerned that she would be mistreated in a facility.  He lives with patient and states that he provides for her care.  They were working to obtain [facility] prior to this hospitalization and would like this service to be activated at d/c.  Son requests, if possible, a Water engineer along with RN and PT.  Above discussed with RNCM- Camille. No further CSW needs identified except she may require EMS transport home at d/c.   Assessment/plan status:  No Further Intervention Required Other assessment/ plan:   Information/referral to community resources:   SNF list offered and deferred.  Discussed Home Health services.    PATIENTS/FAMILYS RESPONSE TO PLAN OF CARE: Pateint is alert and oriented; very pleasant lady. She defers interest in SNF placement and plans to return home at d/c. She is supported in this decision by her sonBernette [name].   Home Health will be arranged by [facility] prior to d/c. She may require EMS transport- tentative d/c home tomorrow per MD.  Lupita Leash T. Torri Michalski, LCSWA  [phone]

## 2012-08-26 NOTE — Progress Notes (Signed)
Physical Therapy Treatment Patient Details Name: [name] [name] MRN: [id number] DOB: 01/06/1925 Today's Date: 08/26/2012 Time: 4098-1191 PT Time Calculation (min): 32 min  PT Assessment / Plan / Recommendation Comments on Treatment Session  Pt. still requires significant +2 assist for OOB mobility and maxA for bed mobility. It was strongly recommended to pt. that ST-SNF would be greatly beneficial for her care, safety, & maximize functional independence.  Pt states that her son lives with her but he will only be able to provide minimal physical assistance due to he has a "bad back".    When discussing how she was going to transfer bed>chair, or couch>3-in-1, pt states she will get on floor & crawl if son unable to assist her.   Spoke with pt's son via phone regarding pt's slow progress & cont'd need for +2 assistance.  Son still refusing SNF as well.      Follow Up Recommendations  Home health PT;Supervision/Assistance - 24 hour (PT is strongly recommending SNF, but pt. and son are refusin)     Does the patient have the potential to tolerate intense rehabilitation     Barriers to Discharge        Equipment Recommendations  Rolling walker with 5" wheels;Wheelchair (measurements PT)    Recommendations for Other Services OT consult  Frequency Min 3X/week   Plan      Precautions / Restrictions Precautions Precautions: Fall Restrictions Weight Bearing Restrictions: No   Pertinent Vitals/Pain Patient made no reports of pain during today's treatment.     Mobility  Bed Mobility Bed Mobility: Rolling Left;Supine to Sit;Sitting - Scoot to [place] of Bed;Sit to Supine Rolling Left: 2: Max assist Supine to Sit: 2: Max assist Sitting - Scoot to [place] of Bed: 2: Max assist Sit to Supine: 2: Max assist Details for Bed Mobility Assistance: Pt. requires frequent cueing and maximal assistance with all bed mobility activities.  Transfers Transfers: Sit to Stand;Stand to Sit;Stand Pivot  Transfers Sit to Stand: 1: +2 Total assist;With upper extremity assist;With armrests;From bed;From chair/3-in-1 Sit to Stand: Patient Percentage: 40% Stand to Sit: 1: +2 Total assist;With upper extremity assist;With armrests;To bed;To chair/3-in-1 Stand to Sit: Patient Percentage: 40% Stand Pivot Transfers: 1: +2 Total assist;From elevated surface;With armrests Stand Pivot Transfers: Patient Percentage: 50% Details for Transfer Assistance: Pt. requires extensive physical assistance and cueing in order to perform transfers. Pt. is unable to extend her trunk and stand tall in walker. Pt. maintained her COG posteriorly and was unable to correct after VC.  Ambulation/Gait Ambulation/Gait Assistance: 1: +2 Total assist Ambulation/Gait: Patient Percentage: 40% Ambulation Distance (Feet): 5 Feet Assistive device: Rolling walker Ambulation/Gait Assistance Details: Pt. needed constant cueing to stay inside of the RW, extend her trunk, and bare weight through her UE.  Gait Pattern: Step-to pattern;Trunk flexed;Narrow base of support;Decreased stride length Gait velocity: decreased Stairs: No Wheelchair Mobility Wheelchair Mobility: No    Exercises       PT Goals Acute Rehab PT Goals Time For Goal Achievement: 08/31/12 Potential to Achieve Goals: Good Pt will go Supine/Side to Sit: with modified independence PT Goal: Supine/Side to Sit - Progress: Not progressing Pt will go Sit to Supine/Side: with modified independence PT Goal: Sit to Supine/Side - Progress: Not progressing Pt will go Sit to Stand: with supervision PT Goal: Sit to Stand - Progress: Not progressing Pt will go Stand to Sit: with supervision PT Goal: Stand to Sit - Progress: Not progressing Pt will Ambulate: 16 - 50 feet;with supervision;with least  restrictive assistive device PT Goal: Ambulate - Progress: Progressing toward goal Pt will Go Up / Down Stairs: 1-2 stairs;with min assist;with rail(s);with least restrictive  assistive device  Visit Information  Last PT Received On: 08/26/12 Assistance Needed: +2    Subjective Data  Subjective: Pt. recieved lying supine, agreeable to PT.   Cognition  Cognition Arousal/Alertness: Awake/alert Behavior During Therapy: WFL for tasks assessed/performed Overall Cognitive Status: No family/caregiver present to determine baseline cognitive functioning    Balance     End of Session PT - End of Session Equipment Utilized During Treatment: Gait belt Activity Tolerance:  (Patient limited by fatigue and weakness) Patient left: in bed;with call bell/phone within reach Nurse Communication: Mobility status   GP     Jolyn Nap, SPTA 08/26/2012, 2:29 PM

## 2012-08-27 DIAGNOSIS — M7989 Other specified soft tissue disorders: Secondary | ICD-10-CM

## 2012-08-27 LAB — BASIC METABOLIC PANEL
BUN: 56 mg/dL — ABNORMAL HIGH (ref 6–23)
Chloride: 102 mEq/L (ref 96–112)
Creatinine, Ser: 3.02 mg/dL — ABNORMAL HIGH (ref 0.50–1.10)
GFR calc Af Amer: 15 mL/min — ABNORMAL LOW (ref >90)
Glucose, Bld: 109 mg/dL — ABNORMAL HIGH (ref 70–99)
Potassium: 4.4 mEq/L (ref 3.5–5.1)

## 2012-08-27 LAB — GLUCOSE, CAPILLARY: Glucose-Capillary: 127 mg/dL — ABNORMAL HIGH (ref 70–99)

## 2012-08-27 MED ORDER — ASPIRIN 81 MG PO CHEW: 81.0000 mg | CHEWABLE_TABLET | Freq: Every day | ORAL | Status: DC

## 2012-08-27 MED ORDER — FUROSEMIDE 80 MG PO TABS: 80.0000 mg | ORAL_TABLET | Freq: Two times a day (BID) | ORAL | Status: DC

## 2012-08-27 MED ORDER — HYDRALAZINE HCL 25 MG PO TABS
25.0000 mg | ORAL_TABLET | Freq: Three times a day (TID) | ORAL | Status: DC
  Administered 2012-08-27: 25 mg via ORAL
  Filled 2012-08-27 (×4): qty 1

## 2012-08-27 MED ORDER — HYDRALAZINE HCL 25 MG PO TABS: 25.0000 mg | ORAL_TABLET | Freq: Three times a day (TID) | ORAL | Status: DC

## 2012-08-27 MED ORDER — CARVEDILOL 6.25 MG PO TABS: 6.2500 mg | ORAL_TABLET | Freq: Two times a day (BID) | ORAL | Status: DC

## 2012-08-27 NOTE — Progress Notes (Signed)
   Subjective:  Denies CP or dyspnea   Objective:  Filed Vitals:   08/26/12 1541 08/26/12 1840 08/26/12 2111 08/27/12 0445  BP: 143/59 112/51 147/61 171/76  Pulse: 53 52 61 63  Temp: 98.1 F (36.7 C)  98.4 F (36.9 C) 98.2 F (36.8 C)  TempSrc: Oral  Oral Oral  Resp: 19  18 18   Height:      Weight:    211 lb 8 oz (95.936 kg)  SpO2: 100%  100% 95%    Intake/Output from previous day:  Intake/Output Summary (Last 24 hours) at 08/27/12 0748 Last data filed at 08/27/12 0440  Gross per 24 hour  Intake    820 ml  Output   2665 ml  Net  -1845 ml    Physical Exam: Physical exam: Well-developed chronically ill appearing in no acute distress.  Skin is warm and dry.  HEENT is normal.  Neck is supple.  Chest is clear to auscultation with normal expansion.  Cardiovascular exam is regular rate and rhythm. 2/6 systolic murmur Abdominal exam nontender or distended. No masses palpated. Extremities show trace edema. neuro grossly intact    Lab Results: Basic Metabolic Panel:  Recent Labs  40/98/11 0520 08/27/12 0500  NA 136 138  K 4.3 4.4  CL 103 102  CO2 24 23  GLUCOSE 76 109*  BUN 57* 56*  CREATININE 3.36* 3.02*  CALCIUM 8.1* 8.7   CBC:  Recent Labs  08/24/12 1640 08/25/12 0400  WBC 7.4 6.8  NEUTROABS 4.7  --   HGB 8.2* 8.5*  HCT 24.8* 26.1*  MCV 87.9 88.2  PLT 165 159   Cardiac Enzymes:  Recent Labs  08/24/12 2340 08/25/12 0410 08/25/12 1125  TROPONINI <0.30 <0.30 <0.30     Assessment/Plan:  1 right heart failure-the patient's volume status is improving. Renal function appears to be unchanged today. Echo shows normal LV function. Change lasix to 80 mg po BID; repeat BMET one week after DC. Patient can be dced from a cardiac standpoint and fu with me 4 weeks; also needs close fu with Dr Chestine Spore for renal insuff and other medical problems. 2 bradycardia-the patient's carvedilol has been decreased. Continue present dose at DC 3 chronic stage IV  kidney disease-monitor renal function as outpt.  4 coronary artery disease-resume aspirin and continue pravachol 40 mg daily. Check lipids and liver in six weeks. 5 hypertension-continue present medications. Patient has multiple comorbidities. Would not pursue aggressive cardiac evaluation. Olga Millers 08/27/2012, 7:48 AM

## 2012-08-27 NOTE — Progress Notes (Signed)
Physical Therapy Treatment Patient Details Name: [name] [name] MRN: [id number] DOB: 01/06/1925 Today's Date: 08/27/2012 Time: 1001-1030 PT Time Calculation (min): 29 min  PT Assessment / Plan / Recommendation Comments on Treatment Session  Pt is making extremely slow progress with mobility.  Session focused on pt + caregiver education.  Pt's son present & assisted with all mobility.  They were able to complete bed mobiltiy & bed<>recliner but it was very effortful & this therapist had to assist with recliner>bed & sit>supine.   Pt is very weak & unsteady with bed mobility/OOB mobility. Pt's son states w/c is unable to fit into pt's room & pt will have to ambulate ~10' to get to her bed but yet she has only been able to take about 4 shuffling steps forwards with +2 assist with therapy. Spoke with both pt & son in length Re: level of care that the pt will need for all mobility at d/c & he states "we'll be ok. I'm going to do what I can".   Both pt & son state they understand how difficult it will be at home but they are adamant about not going to SNF.           Follow Up Recommendations  Home health PT;Supervision/Assistance - 24 hour;Other (comment) (PT is strongly recommending SNF; Pt & son refusing)     Does the patient have the potential to tolerate intense rehabilitation     Barriers to Discharge        Equipment Recommendations  Rolling walker with 5" wheels;Wheelchair (measurements PT)    Recommendations for Other Services    Frequency Min 3X/week   Plan      Precautions / Restrictions Precautions Precautions: Fall Restrictions Weight Bearing Restrictions: No       Mobility  Bed Mobility Bed Mobility: Supine to Sit;Sitting - Scoot to [place] of Bed;Sit to Supine;Scooting to [facility] Supine to Sit: 1: +1 Total assist;HOB flat Sitting - Scoot to Edge of Bed: 1: +1 Total assist Sit to Supine: 1: +1 Total assist;HOB flat Scooting to HOB: 1: +2 Total assist Scooting to [facility]: Patient  Percentage: 10% Details for Bed Mobility Assistance: Pt requires max directional cues for technique.  She demonstrated very little use of UE's to assist for transitional movements.  Pt's son assisted with bed mobility.   Transfers Transfers: Sit to Stand;Stand to [employer] Transfers Sit to Stand: From bed;From chair/3-in-1;With armrests;With upper extremity assist Stand to Sit: With upper extremity assist;To bed;To chair/3-in-1;With armrests Details for Transfer Assistance: Pt's son assisted with transfers.  Pt required step-by-step cueing for technique & safety.   Son was educated on how to safely assist pt.  Although they were able to complete transfer was very effortful.  recliner>bed required increased assistance from therapist due to pt more unsteady & pt + son were having increased difficult time completing.  Pt with extremely flexed posture at hips, leans posteriorly, & becomes off balance with backing up to seated surface.   Ambulation/Gait Ambulation/Gait Assistance: Not tested (comment) Stairs: No Wheelchair Mobility Wheelchair Mobility: No      PT Goals Acute Rehab PT Goals Time For Goal Achievement: 08/31/12 Potential to Achieve Goals: Good Pt will go Supine/Side to Sit: with modified independence PT Goal: Supine/Side to Sit - Progress: Not progressing Pt will go Sit to Supine/Side: with modified independence PT Goal: Sit to Supine/Side - Progress: Not progressing Pt will go Sit to Stand: with supervision PT Goal: Sit to Stand - Progress: Not progressing  Pt will go Stand to Sit: with supervision PT Goal: Stand to Sit - Progress: Not progressing Pt will Ambulate: 16 - 50 feet;with supervision;with least restrictive assistive device Pt will Go Up / Down Stairs: 1-2 stairs;with min assist;with rail(s);with least restrictive assistive device  Visit Information  Last PT Received On: 08/27/12 Assistance Needed: +2    Subjective Data      Cognition   Cognition Arousal/Alertness: Awake/alert Behavior During Therapy: WFL for tasks assessed/performed Overall Cognitive Status: Impaired/Different from baseline Area of Impairment: Safety/judgement Safety/Judgement: Decreased awareness of safety;Decreased awareness of deficits General Comments: Pt states she understands how much assistance she needs 1 minute but then she will talk about how she can get up by herself.      Balance  Static Standing Balance Static Standing - Balance Support: Bilateral upper extremity supported Static Standing - Level of Assistance: 2: Max assist  End of Session PT - End of Session Equipment Utilized During Treatment: Gait belt Activity Tolerance: Patient limited by fatigue Patient left: in bed;with call bell/phone within reach;with family/visitor present Nurse Communication: Mobility status     Verdell Face, [place] [phone] 08/27/2012

## 2012-08-27 NOTE — Progress Notes (Signed)
Patient evaluated for community based chronic disease management services with [facility] Management [facility] as a benefit of patient's [employer]. Spoke with patient and son at bedside to explain [facility] Care Management services. Patient would like to discuss services with her PCP, Margaretmary Bayley, before engaging services.  Left contact information and [facility] literature at bedside. Made inpatient Case Manager Reeves Forth aware that [facility] Care Management consulted. Of note, [facility] Care Management services does not replace or interfere with any services that are arranged by inpatient case management or social work.  For additional questions or referrals please contact Anibal Henderson BSN RN [facility] Liaison at [phone].

## 2012-08-27 NOTE — Progress Notes (Signed)
*  PRELIMINARY RESULTS* Vascular Ultrasound Lower extremity venous duplex has been completed.  Preliminary findings: Technically limited study. No DVT noted in visualized veins.  Farrel Demark, RDMS, RVT  08/27/2012, 11:00 AM

## 2012-08-27 NOTE — Progress Notes (Signed)
[name] [name] to be D/C'd Home per MD order.  Discussed with the patient and all questions fully answered.    Medication List    TAKE these medications       allopurinol 100 MG tablet  Commonly known as:  ZYLOPRIM  Take 100 mg by mouth daily.     amLODipine 5 MG tablet  Commonly known as:  NORVASC  Take 5 mg by mouth daily.     aspirin EC 81 MG tablet  Take 81 mg by mouth daily.     carvedilol 6.25 MG tablet  Commonly known as:  COREG  Take 1 tablet (6.25 mg total) by mouth 2 (two) times daily with a meal.     furosemide 80 MG tablet  Commonly known as:  LASIX  Take 1 tablet (80 mg total) by mouth 2 (two) times daily.     glipiZIDE 2.5 MG 24 hr tablet  Commonly known as:  GLUCOTROL XL  Take 2.5 mg by mouth daily.     glucose 4 GM chewable tablet  Chew 16 g by mouth as needed for low blood sugar.     hydrALAZINE 25 MG tablet  Commonly known as:  APRESOLINE  Take 1 tablet (25 mg total) by mouth [name] 8 (eight) hours.     levothyroxine 50 MCG tablet  Commonly known as:  SYNTHROID, LEVOTHROID  Take 50 mcg by mouth daily before breakfast.     lubiprostone 8 MCG capsule  Commonly known as:  AMITIZA  Take 8 mcg by mouth daily as needed for constipation.     nystatin-triamcinolone cream  Commonly known as:  MYCOLOG II  Apply topically as needed.     oxybutynin 5 MG tablet  Commonly known as:  DITROPAN  Take 5 mg by mouth 2 (two) times daily.        VVS, Skin clean, dry and intact without evidence of skin break down, no evidence of skin tears noted. IV catheter discontinued intact. Site without signs and symptoms of complications. Dressing and pressure applied.  An After Visit Summary was printed and given to the patient. Patient escorted via stretcher, and d/c home via ambulance  Rocky, Thurston Hole 08/27/2012 1:37 PM

## 2012-08-27 NOTE — Discharge Summary (Signed)
[facility]                                                                                   [name] [name], is a 79 y.o. female  DOB 01/06/1925  MRN [id number].  Admission date:  08/24/2012  Discharge Date:  08/27/2012  Primary MD  Laurena Slimmer, MD  Admitting Physician  Jonah Blue, DO  Admission Diagnosis  Type II or unspecified type diabetes mellitus without mention of complication, not stated as uncontrolled [250.00] Anemia, unspecified [285.9] Dizziness and giddiness [780.4] Edema [782.3] Acute right-sided heart failure [428.0] Renal failure [586] CHF exacerbation [428.0] Acute-on-chronic renal failure [584.9, 585.9]  Discharge Diagnosis     Principal Problem:   Acute right-sided heart failure Active Problems:   DIABETES MELLITUS   ANEMIA   HYPERTENSION   CAD   Edema   Medication adverse effect   Acute-on-chronic renal failure   Acute on chronic kidney disease, stage 4-5   Anemia   Diabetes mellitus without complication   Chronic diastolic heart failure   Bradycardia    Past Medical History  Diagnosis Date  . CAD (coronary artery disease)   . Gout   . Hyperlipemia   . Hypertension   . Diabetes mellitus without complication   . MI (myocardial infarction) August 1999    PCI RCA  . Chronic kidney disease, stage IV (severe)   . Anemia     Past Surgical History  Procedure Laterality Date  . Abdominal surgery    . Replacement total knee Left   . Total shoulder replacement Right   . Appendectomy    . Cholecystectomy    . Cardiac catheterization  1999    LAD OK, OM2 70%, RCA 90/80% with spont dissection, S/P PCI RCA     Recommendations for primary care physician for things to follow:    Follow weight, BMP closely along with diuretic dose   Discharge Diagnoses:   Principal Problem:   Acute right-sided heart failure Active Problems:   DIABETES MELLITUS   ANEMIA   HYPERTENSION   CAD   Edema   Medication adverse effect    Acute-on-chronic renal failure   Acute on chronic kidney disease, stage 4-5   Anemia   Diabetes mellitus without complication   Chronic diastolic heart failure   Bradycardia    Discharge Condition: Stable   Diet recommendation: See Discharge Instructions below   Consults Cards    History of present illness and  Hospital Course:     Kindly see H&P for history of present illness and admission details, please review complete Labs, Consult reports and Test reports for all details in brief [name] [name], is a 79 y.o. female, patient with history of questionable diet-controlled diabetes mellitus with A1c 5.4, CAD, hypertension, anemia of chronic disease, CKD stage IV baseline creatinine around 3, presented to the hospital with generalized weakness and deconditioning along with right-sided heart failure, she was also found to be bradycardic due to high doses of Coreg.   She was seen by cardiology, she was diuresed with IV Lasix and her Coreg dose was dropped with good effect, she now appears close to euvolemic, she never had  any shortness of breath and rales as she has largely right heart failure, her heart rate is better, she will benefit from rehabilitation placement however she and her son both refuse it. She at this time will be discharged home with home PT, iron and a health aide. I've also ordered a home social worker in case patient is unable to do well at home I have advised the son to seek help of the social worker for rehabilitation placement.  She had mild acute renal insufficiency on top of Her chronic kidney disease stage IV due to volume overload and it has been serially improving with continued diuresis. I have adjusted her Lasix dose and dropped her Coreg dose, she will follow with her PCP in 3-4 days along with cardiology as outpatient post discharge. Will request primary care physician to kindly monitor her weight, BMP and diuretic dose closely.   Due to drop in her Coreg  her blood pressure is slightly high and I have added hydralazine. Please continue to monitor blood pressure as outpatient.      Today   Subjective:   [name] [name] today has no headache,no chest abdominal pain,no new weakness tingling or numbness, feels much better wants to go home today.    Objective:   Blood pressure 171/76, pulse 63, temperature 98.2 F (36.8 C), temperature source Oral, resp. rate 18, height 5\' 5"  (1.651 m), weight 95.936 kg (211 lb 8 oz), SpO2 95.00%.   Intake/Output Summary (Last 24 hours) at 08/27/12 0805 Last data filed at 08/27/12 0440  Gross per 24 hour  Intake    820 ml  Output   2665 ml  Net  -1845 ml    Exam Awake Alert, Oriented *3, No new F.N deficits, Normal affect [place].AT,PERRAL Supple Neck,No JVD, No cervical lymphadenopathy appriciated.  Symmetrical Chest wall movement, Good air movement bilaterally, CTAB RRR,No Gallops,Rubs or new Murmurs, No Parasternal Heave +ve B.Sounds, Abd Soft, Non tender, No organomegaly appriciated, No rebound -guarding or rigidity. No Cyanosis, Clubbing or edema, No new Rash or bruise  Data Review   Major procedures and Radiology Reports - PLEASE review detailed and final reports for all details in brief -    Echo   - Left ventricle: The cavity size was normal. Wall thickness was normal. Systolic function was normal. The estimated ejection fraction was in the range of 55% to 60%. - Aortic valve: Poor qualityh CW doppler performed Moderately calcified annulus. There was mild stenosis. - Left atrium: The atrium was mildly dilated. - Right ventricle: The cavity size was moderately dilated. - Right atrium: The atrium was moderately dilated. - Atrial septum: No defect or patent foramen ovale was identified. - Pulmonary arteries: PA peak pressure: 47mm Hg (S).    Dg Chest Port 1 View  08/24/2012   *RADIOLOGY REPORT*  Clinical Data: Dizziness with history of diabetes.  PORTABLE CHEST - 1 VIEW  Comparison:  06/22/2009.  Findings: Cardiomegaly.  Small right effusion.  Mild right basilar atelectasis.  No infiltrates or failure.  Right shoulder hemiarthroplasty.  Vascular calcification of the aorta.  Slight worsening aeration from priors.  IMPRESSION: Cardiomegaly.  Small right effusion.  Mild right basilar atelectasis.  No infiltrates or overt failure.   Original Report Authenticated By: Davonna Belling, M.D.    Micro Results      Recent Results (from the past 240 hour(s))  URINE CULTURE     Status: None   Collection Time    08/25/12  4:40 AM  Result Value Range Status   Specimen Description URINE, RANDOM   Final   Special Requests NONE   Final   Culture  Setup Time 08/25/2012 04:50   Final   Colony Count 25,000 COLONIES/ML   Final   Culture     Final   Value: Multiple bacterial morphotypes present, none predominant. Suggest appropriate recollection if clinically indicated.   Report Status 08/26/2012 FINAL   Final  URINE CULTURE     Status: None   Collection Time    08/25/12  9:28 AM      Result Value Range Status   Specimen Description URINE, CATHETERIZED   Final   Special Requests NONE   Final   Culture  Setup Time 08/25/2012 17:59   Final   Colony Count NO GROWTH   Final   Culture NO GROWTH   Final   Report Status 08/26/2012 FINAL   Final     CBC w Diff: Lab Results  Component Value Date   WBC 6.8 08/25/2012   HGB 8.5* 08/25/2012   HCT 26.1* 08/25/2012   PLT 159 08/25/2012   LYMPHOPCT 24 08/24/2012   MONOPCT 6 08/24/2012   EOSPCT 6* 08/24/2012   BASOPCT 0 08/24/2012    CMP: Lab Results  Component Value Date   NA 138 08/27/2012   K 4.4 08/27/2012   CL 102 08/27/2012   CO2 23 08/27/2012   BUN 56* 08/27/2012   CREATININE 3.02* 08/27/2012   PROT 5.8* 08/25/2012   ALBUMIN 3.0* 08/25/2012   BILITOT 0.2* 08/25/2012   ALKPHOS 79 08/25/2012   AST 10 08/25/2012   ALT 5 08/25/2012  .   Discharge Instructions     Home Health RN/PT/Nurse Aide set up through [facility]  [phone].  Follow with Primary MD Laurena Slimmer, MD in 3 days   Get CBC, CMP, checked 3 days by Primary MD and again as instructed by your Primary MD.   Get Medicines reviewed and adjusted.  Please request your Prim.MD to go over all Hospital Tests and Procedure/Radiological results at the follow up, please get all Hospital records sent to your Prim MD by signing hospital release before you go home.  Activity: As tolerated with Full fall precautions use walker/cane & assistance as needed   Diet:  Heart Healthy,  Fluid restriction 1.5 lit/day, Aspiration precautions.  For Heart failure patients - Check your Weight same time everyday, if you gain over 2 pounds, or you develop in leg swelling, experience more shortness of breath or chest pain, call your Primary MD immediately. Follow Cardiac Low Salt Diet and 1.5 lit/day fluid restriction.  Disposition Home    If you experience worsening of your admission symptoms, develop shortness of breath, life threatening emergency, suicidal or homicidal thoughts you must seek medical attention immediately by calling 911 or calling your MD immediately  if symptoms less severe.  You Must read complete instructions/literature along with all the possible adverse reactions/side effects for all the Medicines you take and that have been prescribed to you. Take any new Medicines after you have completely understood and accpet all the possible adverse reactions/side effects.   Do not drive and provide baby sitting services if your were admitted for syncope or siezures until you have seen by Primary MD or a Neurologist and advised to do so again.  Do not drive when taking Pain medications.    Do not take more than prescribed Pain, Sleep and Anxiety Medications  Special Instructions: If you have smoked or  chewed Tobacco  in the last 2 yrs please stop smoking, stop any regular Alcohol  and or any Recreational drug use.  Wear Seat belts while  driving.   Please note  You were cared for by a hospitalist during your hospital stay. If you have any questions about your discharge medications or the care you received while you were in the hospital after you are discharged, you can call the unit and asked to speak with the hospitalist on call if the hospitalist that took care of you is not available. Once you are discharged, your primary care physician will handle any further medical issues. Please note that NO REFILLS for any discharge medications will be authorized once you are discharged, as it is imperative that you return to your primary care physician (or establish a relationship with a primary care physician if you do not have one) for your aftercare needs so that they can reassess your need for medications and monitor your lab values.'       Follow-up Information   Follow up with Laurena Slimmer, MD. Schedule an appointment as soon as possible for a visit in 3 days.   Contact information:   [address] [place] [address] [phone]       Follow up with Olga Millers, MD. Schedule an appointment as soon as possible for a visit in 1 week.   Contact information:   [address] [place], [address]                         [address] [place] [address] [phone]         Discharge Medications     Medication List    TAKE these medications       allopurinol 100 MG tablet  Commonly known as:  ZYLOPRIM  Take 100 mg by mouth daily.     amLODipine 5 MG tablet  Commonly known as:  NORVASC  Take 5 mg by mouth daily.     aspirin EC 81 MG tablet  Take 81 mg by mouth daily.     carvedilol 6.25 MG tablet  Commonly known as:  COREG  Take 1 tablet (6.25 mg total) by mouth 2 (two) times daily with a meal.     furosemide 80 MG tablet  Commonly known as:  LASIX  Take 1 tablet (80 mg total) by mouth 2 (two) times daily.     glipiZIDE 2.5 MG 24 hr tablet  Commonly known as:  GLUCOTROL XL  Take 2.5 mg by mouth  daily.     glucose 4 GM chewable tablet  Chew 16 g by mouth as needed for low blood sugar.     hydrALAZINE 25 MG tablet  Commonly known as:  APRESOLINE  Take 1 tablet (25 mg total) by mouth every 8 (eight) hours.     levothyroxine 50 MCG tablet  Commonly known as:  SYNTHROID, LEVOTHROID  Take 50 mcg by mouth daily before breakfast.     lubiprostone 8 MCG capsule  Commonly known as:  AMITIZA  Take 8 mcg by mouth daily as needed for constipation.     nystatin-triamcinolone cream  Commonly known as:  MYCOLOG II  Apply topically as needed.     oxybutynin 5 MG tablet  Commonly known as:  DITROPAN  Take 5 mg by mouth 2 (two) times daily.           Total Time in preparing paper work, data evaluation  and todays exam - 35 minutes  Leroy Sea M.D on 08/27/2012 at 8:05 AM  [facility] Office  [phone]

## 2012-09-10 LAB — HEMOGLOBIN A1C
Hgb A1c MFr Bld: 5.4 % (ref <5.7)
Mean Plasma Glucose: 108 mg/dL (ref <117)

## 2012-09-10 LAB — URINE CULTURE: Colony Count: 25000

## 2012-09-10 LAB — IRON AND TIBC
Saturation Ratios: 13 % — ABNORMAL LOW (ref 20–55)
TIBC: 180 ug/dL — ABNORMAL LOW (ref 250–470)
UIBC: 156 ug/dL (ref 125–400)

## 2012-09-17 ENCOUNTER — Other Ambulatory Visit (HOSPITAL_COMMUNITY): Payer: Self-pay | Admitting: Internal Medicine

## 2012-09-17 NOTE — Telephone Encounter (Signed)
Medication refill

## 2012-10-20 ENCOUNTER — Other Ambulatory Visit (HOSPITAL_COMMUNITY): Payer: Self-pay | Admitting: Internal Medicine

## 2012-10-21 NOTE — Telephone Encounter (Signed)
Medication refill-lasix

## 2012-10-24 ENCOUNTER — Other Ambulatory Visit: Payer: Self-pay | Admitting: Emergency Medicine

## 2012-10-24 ENCOUNTER — Other Ambulatory Visit (HOSPITAL_COMMUNITY): Payer: Self-pay | Admitting: Internal Medicine

## 2012-10-24 DIAGNOSIS — I10 Essential (primary) hypertension: Secondary | ICD-10-CM

## 2012-10-25 MED ORDER — HYDRALAZINE HCL 25 MG PO TABS: 25.0000 mg | ORAL_TABLET | Freq: Three times a day (TID) | ORAL | Status: DC

## 2012-11-16 ENCOUNTER — Other Ambulatory Visit (HOSPITAL_COMMUNITY): Payer: Self-pay | Admitting: Internal Medicine

## 2012-11-18 NOTE — Telephone Encounter (Signed)
MEDICATION REFILL-LASEX

## 2012-12-18 ENCOUNTER — Other Ambulatory Visit (HOSPITAL_COMMUNITY): Payer: Self-pay | Admitting: Internal Medicine

## 2013-08-26 ENCOUNTER — Emergency Department (HOSPITAL_COMMUNITY)
Admission: EM | Admit: 2013-08-26 | Discharge: 2013-08-26 | Disposition: A | Discharge status: Home / Self Care | Payer: Medicare Other | Attending: Emergency Medicine | Admitting: Emergency Medicine

## 2013-08-26 ENCOUNTER — Encounter (HOSPITAL_COMMUNITY): Payer: Self-pay | Admitting: Emergency Medicine

## 2013-08-26 DIAGNOSIS — E860 Dehydration: Secondary | ICD-10-CM | POA: Insufficient documentation

## 2013-08-26 DIAGNOSIS — N184 Chronic kidney disease, stage 4 (severe): Secondary | ICD-10-CM | POA: Insufficient documentation

## 2013-08-26 DIAGNOSIS — I129 Hypertensive chronic kidney disease with stage 1 through stage 4 chronic kidney disease, or unspecified chronic kidney disease: Secondary | ICD-10-CM | POA: Insufficient documentation

## 2013-08-26 DIAGNOSIS — R5381 Other malaise: Secondary | ICD-10-CM | POA: Insufficient documentation

## 2013-08-26 DIAGNOSIS — I2510 Atherosclerotic heart disease of native coronary artery without angina pectoris: Secondary | ICD-10-CM | POA: Insufficient documentation

## 2013-08-26 DIAGNOSIS — R5383 Other fatigue: Secondary | ICD-10-CM

## 2013-08-26 DIAGNOSIS — Z7982 Long term (current) use of aspirin: Secondary | ICD-10-CM | POA: Insufficient documentation

## 2013-08-26 DIAGNOSIS — Z862 Personal history of diseases of the blood and blood-forming organs and certain disorders involving the immune mechanism: Secondary | ICD-10-CM | POA: Insufficient documentation

## 2013-08-26 DIAGNOSIS — M109 Gout, unspecified: Secondary | ICD-10-CM | POA: Insufficient documentation

## 2013-08-26 DIAGNOSIS — E785 Hyperlipidemia, unspecified: Secondary | ICD-10-CM | POA: Insufficient documentation

## 2013-08-26 DIAGNOSIS — Z79899 Other long term (current) drug therapy: Secondary | ICD-10-CM | POA: Insufficient documentation

## 2013-08-26 DIAGNOSIS — E119 Type 2 diabetes mellitus without complications: Secondary | ICD-10-CM | POA: Insufficient documentation

## 2013-08-26 DIAGNOSIS — B379 Candidiasis, unspecified: Secondary | ICD-10-CM | POA: Insufficient documentation

## 2013-08-26 LAB — CBC
HCT: 28.4 % — ABNORMAL LOW (ref 36.0–46.0)
Hemoglobin: 9.6 g/dL — ABNORMAL LOW (ref 12.0–15.0)
MCH: 29.3 pg (ref 26.0–34.0)
MCHC: 33.8 g/dL (ref 30.0–36.0)
MCV: 86.6 fL (ref 78.0–100.0)
PLATELETS: 192 10*3/uL (ref 150–400)
RBC: 3.28 MIL/uL — AB (ref 3.87–5.11)
RDW: 13.6 % (ref 11.5–15.5)
WBC: 9.1 10*3/uL (ref 4.0–10.5)

## 2013-08-26 LAB — URINALYSIS, ROUTINE W REFLEX MICROSCOPIC
Bilirubin Urine: NEGATIVE
Glucose, UA: NEGATIVE mg/dL
Hgb urine dipstick: NEGATIVE
Ketones, ur: NEGATIVE mg/dL
NITRITE: NEGATIVE
PROTEIN: NEGATIVE mg/dL
Specific Gravity, Urine: 1.012 (ref 1.005–1.030)
UROBILINOGEN UA: 0.2 mg/dL (ref 0.0–1.0)
pH: 5 (ref 5.0–8.0)

## 2013-08-26 LAB — BASIC METABOLIC PANEL
BUN: 29 mg/dL — ABNORMAL HIGH (ref 6–23)
CALCIUM: 8.9 mg/dL (ref 8.4–10.5)
CO2: 22 meq/L (ref 19–32)
Chloride: 88 mEq/L — ABNORMAL LOW (ref 96–112)
Creatinine, Ser: 2.6 mg/dL — ABNORMAL HIGH (ref 0.50–1.10)
GFR, EST AFRICAN AMERICAN: 18 mL/min — AB (ref >90)
GFR, EST NON AFRICAN AMERICAN: 15 mL/min — AB (ref >90)
Glucose, Bld: 131 mg/dL — ABNORMAL HIGH (ref 70–99)
Potassium: 3.6 mEq/L — ABNORMAL LOW (ref 3.7–5.3)
SODIUM: 127 meq/L — AB (ref 137–147)

## 2013-08-26 LAB — URINE MICROSCOPIC-ADD ON

## 2013-08-26 MED ORDER — SODIUM CHLORIDE 0.9 % IV BOLUS (SEPSIS)
1000.0000 mL | Freq: Once | INTRAVENOUS | Status: AC
  Administered 2013-08-26: 1000 mL via INTRAVENOUS

## 2013-08-26 MED ORDER — FLUCONAZOLE 100 MG PO TABS
150.0000 mg | ORAL_TABLET | Freq: Once | ORAL | Status: AC
  Administered 2013-08-26: 150 mg via ORAL

## 2013-08-26 NOTE — ED Notes (Signed)
The family is worried she may be dehydrated. shes been sleeping all day and not eating well this week. They have no AC in their house

## 2013-08-26 NOTE — ED Notes (Signed)
Pt has been sitting on bedside commode for approx 15 min and remains unable to void pt agrees to have in and out cath

## 2013-08-26 NOTE — ED Provider Notes (Signed)
CSN: [id number]     Arrival date & time 08/26/13  1603 History   First MD Initiated Contact with Patient 08/26/13 1608     Chief Complaint  Patient presents with  . Dehydration     (Consider location/radiation/quality/duration/timing/severity/associated sxs/prior Treatment) HPI Comments: 80 year old female with a past medical history of CAD, gout, hyperlipidemia, hypertension, diabetes, CKD and anemia presents to the emergency department with her son with concerns of dehydration. Patient lives at home with her son, and over the past week she has been sleeping later than normal. Her son states he seems dehydrated. Her home has had no air conditioning "for a while" in the past 2 weeks have been extremely hot. Patient reports she was feeling very hot over the past couple of weeks. Son called PCP today who advised him to bring her to the emergency department for evaluation. She has been eating well, slightly decreased from normal, drinking well, normal urine output. Denies any pain. Denies chest pain, shortness of breath, headaches, or dizziness. She is ambulatory with a cane which her normal.  The history is provided by the patient and a relative.    Past Medical History  Diagnosis Date  . CAD (coronary artery disease)   . Gout   . Hyperlipemia   . Hypertension   . Diabetes mellitus without complication   . MI (myocardial infarction) August 1999    PCI RCA  . Chronic kidney disease, stage IV (severe)   . Anemia    Past Surgical History  Procedure Laterality Date  . Abdominal surgery    . Replacement total knee Left   . Total shoulder replacement Right   . Appendectomy    . Cholecystectomy    . Cardiac catheterization  1999    LAD OK, OM2 70%, RCA 90/80% with spont dissection, S/P PCI RCA   Family History  Problem Relation Age of Onset  . Arthritis Mother     Died at age 77  . Arthritis Sister    History  Substance Use Topics  . Smoking status: Never Smoker   . Smokeless  tobacco: Not on file  . Alcohol Use: No   OB History   Grav Para Term Preterm Abortions TAB SAB Ect Mult Living                 Review of Systems  Constitutional: Positive for fatigue.  Neurological: Positive for weakness.  All other systems reviewed and are negative.     Allergies  Prinivil  Home Medications   Prior to Admission medications   Medication Sig Start Date End Date Taking? Authorizing Provider  allopurinol (ZYLOPRIM) 100 MG tablet Take 100 mg by mouth daily.   Yes Historical Provider, MD  aspirin EC 81 MG tablet Take 81 mg by mouth daily.   Yes Historical Provider, MD  carvedilol (COREG) 6.25 MG tablet Take 1 tablet (6.25 mg total) by mouth 2 (two) times daily with a meal. 08/27/12  Yes Leroy Sea, MD  furosemide (LASIX) 80 MG tablet TAKE 1 TABLET (80 MG TOTAL) BY MOUTH 2 (TWO) TIMES DAILY. 11/16/12  Yes Dorothea Ogle, MD  glipiZIDE (GLUCOTROL XL) 2.5 MG 24 hr tablet Take 2.5 mg by mouth daily.   Yes Historical Provider, MD  glucose 4 GM chewable tablet Chew 16 g by mouth as needed for low blood sugar.   Yes Historical Provider, MD  levothyroxine (SYNTHROID, LEVOTHROID) 50 MCG tablet Take 50 mcg by mouth daily before breakfast.   Yes Historical  Provider, MD  lubiprostone (AMITIZA) 8 MCG capsule Take 8 mcg by mouth 2 (two) times daily.    Yes Historical Provider, MD  nystatin-triamcinolone (MYCOLOG II) cream Apply topically as needed.     Yes Historical Provider, MD  oxybutynin (DITROPAN) 5 MG tablet Take 5 mg by mouth 2 (two) times daily.   Yes Historical Provider, MD   BP 123/55  Pulse 57  Temp(Src) 99.5 F (37.5 C) (Oral)  Resp 23  Ht 5\' 4"  (1.626 m)  Wt 184 lb 8 oz (83.689 kg)  BMI 31.65 kg/m2  SpO2 100% Physical Exam  Nursing note and vitals reviewed. Constitutional: She is oriented to person, place, and time. She appears well-developed and well-nourished. No distress.  HENT:  Head: Normocephalic and atraumatic.  Mouth/Throat: Oropharynx is clear  and moist.  Moist MM.  Eyes: Conjunctivae and EOM are normal. Pupils are equal, round, and reactive to light.  Neck: Normal range of motion. Neck supple.  Cardiovascular: Normal rate, regular rhythm, normal heart sounds and intact distal pulses.   No extremity edema.  Pulmonary/Chest: Effort normal and breath sounds normal.  Abdominal: Soft. Bowel sounds are normal. There is no tenderness.  Musculoskeletal: Normal range of motion. She exhibits no edema.  Neurological: She is alert and oriented to person, place, and time.  Skin: Skin is warm and dry. She is not diaphoretic.  Psychiatric: She has a normal mood and affect. Her behavior is normal.    ED Course  Procedures (including critical care time) Labs Review Labs Reviewed  CBC - Abnormal; Notable for the following:    RBC 3.28 (*)    Hemoglobin 9.6 (*)    HCT 28.4 (*)    All other components within normal limits  BASIC METABOLIC PANEL - Abnormal; Notable for the following:    Sodium 127 (*)    Potassium 3.6 (*)    Chloride 88 (*)    Glucose, Bld 131 (*)    BUN 29 (*)    Creatinine, Ser 2.60 (*)    GFR calc non Af Amer 15 (*)    GFR calc Af Amer 18 (*)    All other components within normal limits  URINALYSIS, ROUTINE W REFLEX MICROSCOPIC - Abnormal; Notable for the following:    APPearance CLOUDY (*)    Leukocytes, UA SMALL (*)    All other components within normal limits  URINE MICROSCOPIC-ADD ON - Abnormal; Notable for the following:    Squamous Epithelial / LPF MANY (*)    Bacteria, UA MANY (*)    All other components within normal limits  URINE CULTURE    Imaging Review No results found.   EKG Interpretation   Date/Time:  Tuesday August 26 2013 16:59:38 EDT Ventricular Rate:  66 PR Interval:  240 QRS Duration: 108 QT Interval:  434 QTC Calculation: 455 R Axis:   31 Text Interpretation:  Sinus rhythm Prolonged PR interval Posterior  infarct, old No significant change since last tracing Confirmed by KNAPP    MD-J, JON ([id number]) on 08/26/2013 5:02:32 PM      MDM   Final diagnoses:  Yeast infection  Dehydration   Pt presenting with son for evaluation of dehydration. She is well appearing and in NAD. VSS. Normal PE. She has been in a house in the extreme heat with no AC. Son is working on having this fixed. Plan to check basic labs, UA and rehydrate with IV fluids. 7:52 PM Labs without any acute finding. Creatinine chronically elevated. Urine positive  for many yeast. Diflucan given in the emergency department. Urine culture pending. Patient stable for discharge. I discussed hydration and staying in a cool environment. Son states he will get and then try to get air conditioning fixed. Return precautions given. Patient states understanding of treatment care plan and is agreeable.  Case discussed with attending Dr. Lynelle DoctorKnapp who also evaluated patient and agrees with plan of care.   Trevor MaceRobyn M Albert, PA-C 08/26/13 1953

## 2013-08-26 NOTE — ED Notes (Signed)
Iced water given

## 2013-08-26 NOTE — Discharge Instructions (Signed)
It is important for you to stay well-hydrated and in a cool area.  Candida Infection, Adult A candida infection (also called yeast, fungus and Monilia infection) is an overgrowth of yeast that can occur anywhere on the body. A yeast infection commonly occurs in warm, moist body areas. Usually, the infection remains localized but can spread to become a systemic infection. A yeast infection may be a sign of a more severe disease such as diabetes, leukemia, or AIDS. A yeast infection can occur in both men and women. In women, Candida vaginitis is a vaginal infection. It is one of the most common causes of vaginitis. Men usually do not have symptoms or know they have an infection until other problems develop. Men may find out they have a yeast infection because their sex partner has a yeast infection. Uncircumcised men are more likely to get a yeast infection than circumcised men. This is because the uncircumcised glans is not exposed to air and does not remain as dry as that of a circumcised glans. Older adults may develop yeast infections around dentures. CAUSES  Women  Antibiotics.  Steroid medication taken for a long time.  Being overweight (obese).  Diabetes.  Poor immune condition.  Certain serious medical conditions.  Immune suppressive medications for organ transplant patients.  Chemotherapy.  Pregnancy.  Menstration.  Stress and fatigue.  Intravenous drug use.  Oral contraceptives.  Wearing tight-fitting clothes in the crotch area.  Catching it from a sex partner who has a yeast infection.  Spermicide.  Intravenous, urinary, or other catheters. Men  Catching it from a sex partner who has a yeast infection.  Having oral or anal sex with a person who has the infection.  Spermicide.  Diabetes.  Antibiotics.  Poor immune system.  Medications that suppress the immune system.  Intravenous drug use.  Intravenous, urinary, or other catheters. SYMPTOMS   Women  Thick, white vaginal discharge.  Vaginal itching.  Redness and swelling in and around the vagina.  Irritation of the lips of the vagina and perineum.  Blisters on the vaginal lips and perineum.  Painful sexual intercourse.  Low blood sugar (hypoglycemia).  Painful urination.  Bladder infections.  Intestinal problems such as constipation, indigestion, bad breath, bloating, increase in gas, diarrhea, or loose stools. Men  Men may develop intestinal problems such as constipation, indigestion, bad breath, bloating, increase in gas, diarrhea, or loose stools.  Dry, cracked skin on the penis with itching or discomfort.  Jock itch.  Dry, flaky skin.  Athlete's foot.  Hypoglycemia. DIAGNOSIS  Women  A history and an exam are performed.  The discharge may be examined under a microscope.  A culture may be taken of the discharge. Men  A history and an exam are performed.  Any discharge from the penis or areas of cracked skin will be looked at under the microscope and cultured.  Stool samples may be cultured. TREATMENT  Women  Vaginal antifungal suppositories and creams.  Medicated creams to decrease irritation and itching on the outside of the vagina.  Warm compresses to the perineal area to decrease swelling and discomfort.  Oral antifungal medications.  Medicated vaginal suppositories or cream for repeated or recurrent infections.  Wash and dry the irritation areas before applying the cream.  Eating yogurt with lactobacillus may help with prevention and treatment.  Sometimes painting the vagina with gentian violet solution may help if creams and suppositories do not work. Men  Antifungal creams and oral antifungal medications.  Sometimes treatment must  continue for 30 days after the symptoms go away to prevent recurrence. HOME CARE INSTRUCTIONS  Women  Use cotton underwear and avoid tight-fitting clothing.  Avoid colored, scented toilet  paper and deodorant tampons or pads.  Do not douche.  Keep your diabetes under control.  Finish all the prescribed medications.  Keep your skin clean and dry.  Consume milk or yogurt with lactobacillus active culture regularly. If you get frequent yeast infections and think that is what the infection is, there are over-the-counter medications that you can get. If the infection does not show healing in 3 days, talk to your caregiver.  Tell your sex partner you have a yeast infection. Your partner may need treatment also, especially if your infection does not clear up or recurs. Men  Keep your skin clean and dry.  Keep your diabetes under control.  Finish all prescribed medications.  Tell your sex partner that you have a yeast infection so they can be treated if necessary. SEEK MEDICAL CARE IF:   Your symptoms do not clear up or worsen in one week after treatment.  You have an oral temperature above 102 F (38.9 C).  You have trouble swallowing or eating for a prolonged time.  You develop blisters on and around your vagina.  You develop vaginal bleeding and it is not your menstrual period.  You develop abdominal pain.  You develop intestinal problems as mentioned above.  You get weak or lightheaded.  You have painful or increased urination.  You have pain during sexual intercourse. MAKE SURE YOU:   Understand these instructions.  Will watch your condition.  Will get help right away if you are not doing well or get worse. Document Released: 03/30/2004 Document Revised: 05/15/2011 Document Reviewed: 07/12/2009 [facility] Patient Information 2015 [place], [place]. This information is not intended to replace advice given to you by your health care provider. Make sure you discuss any questions you have with your health care provider.  Dehydration, Adult Dehydration is when you lose more fluids from the body than you take in. Vital organs like the kidneys, brain, and heart  cannot function without a proper amount of fluids and salt. Any loss of fluids from the body can cause dehydration.  CAUSES   Vomiting.  Diarrhea.  Excessive sweating.  Excessive urine output.  Fever. SYMPTOMS  Mild dehydration  Thirst.  Dry lips.  Slightly dry mouth. Moderate dehydration  Very dry mouth.  Sunken eyes.  Skin does not bounce back quickly when lightly pinched and released.  Dark urine and decreased urine production.  Decreased tear production.  Headache. Severe dehydration  Very dry mouth.  Extreme thirst.  Rapid, weak pulse (more than 100 beats per minute at rest).  Cold hands and feet.  Not able to sweat in spite of heat and temperature.  Rapid breathing.  Blue lips.  Confusion and lethargy.  Difficulty being awakened.  Minimal urine production.  No tears. DIAGNOSIS  Your caregiver will diagnose dehydration based on your symptoms and your exam. Blood and urine tests will help confirm the diagnosis. The diagnostic evaluation should also identify the cause of dehydration. TREATMENT  Treatment of mild or moderate dehydration can often be done at home by increasing the amount of fluids that you drink. It is best to drink small amounts of fluid more often. Drinking too much at one time can make vomiting worse. Refer to the home care instructions below. Severe dehydration needs to be treated at the hospital where you will probably  be given intravenous (IV) fluids that contain water and electrolytes. HOME CARE INSTRUCTIONS   Ask your caregiver about specific rehydration instructions.  Drink enough fluids to keep your urine clear or pale yellow.  Drink small amounts frequently if you have nausea and vomiting.  Eat as you normally do.  Avoid:  Foods or drinks high in sugar.  Carbonated drinks.  Juice.  Extremely hot or cold fluids.  Drinks with caffeine.  Fatty, greasy foods.  Alcohol.  Tobacco.  Overeating.  Gelatin  desserts.  Wash your hands well to avoid spreading bacteria and viruses.  Only take over-the-counter or prescription medicines for pain, discomfort, or fever as directed by your caregiver.  Ask your caregiver if you should continue all prescribed and over-the-counter medicines.  Keep all follow-up appointments with your caregiver. SEEK MEDICAL CARE IF:  You have abdominal pain and it increases or stays in one area (localizes).  You have a rash, stiff neck, or severe headache.  You are irritable, sleepy, or difficult to awaken.  You are weak, dizzy, or extremely thirsty. SEEK IMMEDIATE MEDICAL CARE IF:   You are unable to keep fluids down or you get worse despite treatment.  You have frequent episodes of vomiting or diarrhea.  You have blood or green matter (bile) in your vomit.  You have blood in your stool or your stool looks black and tarry.  You have not urinated in 6 to 8 hours, or you have only urinated a small amount of very dark urine.  You have a fever.  You faint. MAKE SURE YOU:   Understand these instructions.  Will watch your condition.  Will get help right away if you are not doing well or get worse. Document Released: 02/20/2005 Document Revised: 05/15/2011 Document Reviewed: 10/10/2010 [facility] Patient Information 2015 [place]. This information is not intended to replace advice given to you by your health care provider. Make sure you discuss any questions you have with your health care provider.

## 2013-08-26 NOTE — ED Notes (Signed)
Per pt she ambulates at home with use of cane.

## 2013-08-26 NOTE — ED Notes (Signed)
Pt monitored by 5-lead, bp cuff, and pulse ox. 

## 2013-08-27 LAB — URINE CULTURE
CULTURE: NO GROWTH
Colony Count: NO GROWTH

## 2013-08-27 NOTE — ED Provider Notes (Signed)
Pt presented to ED with increased somnolence and fatigue this week. Patient came in with her family. They were concerned about the possibility of dehydration. The temperatures have been very hot recently and they do not have air conditioning in the home. She has not been having any specific complaints. No fevers or coughing. She has been eating and drinking well.  Patient appears comfortable in no distress Heart regular rate and rhythm Lungs clear to auscultation Neurologic exam patient is alert and oriented  Patient's urinalysis showed bacteria squamous cells and yeast.  She was treated with a dose of Diflucan. Urine culture was sent off for analysis. Patient otherwise appeared stable for discharge and outpatient follow up  Medical screening examination/treatment/procedure(s) were conducted as a shared visit with non-physician practitioner(s) and myself.  I personally evaluated the patient during the encounter.   EKG Interpretation   Date/Time:  Tuesday August 26 2013 16:59:38 EDT Ventricular Rate:  66 PR Interval:  240 QRS Duration: 108 QT Interval:  434 QTC Calculation: 455 R Axis:   31 Text Interpretation:  Sinus rhythm Prolonged PR interval Posterior  infarct, old No significant change since last tracing Confirmed by KNAPP   MD-J, JON [phone] on 08/26/2013 5:02:32 PM        Linwood DibblesJon Knapp, MD 08/27/13 1515

## 2013-09-01 ENCOUNTER — Emergency Department (HOSPITAL_COMMUNITY): Payer: Medicare Other

## 2013-09-01 ENCOUNTER — Inpatient Hospital Stay (HOSPITAL_COMMUNITY)
Admission: EM | Admit: 2013-09-01 | Discharge: 2013-09-05 | DRG: 640 | Disposition: A | Discharge status: Skilled Nursing Facility | Payer: Medicare Other | Attending: Internal Medicine | Admitting: Internal Medicine

## 2013-09-01 ENCOUNTER — Encounter (HOSPITAL_COMMUNITY): Payer: Self-pay | Admitting: Emergency Medicine

## 2013-09-01 DIAGNOSIS — M1710 Unilateral primary osteoarthritis, unspecified knee: Secondary | ICD-10-CM | POA: Diagnosis present

## 2013-09-01 DIAGNOSIS — N289 Disorder of kidney and ureter, unspecified: Secondary | ICD-10-CM

## 2013-09-01 DIAGNOSIS — Z9181 History of falling: Secondary | ICD-10-CM

## 2013-09-01 DIAGNOSIS — N189 Chronic kidney disease, unspecified: Secondary | ICD-10-CM

## 2013-09-01 DIAGNOSIS — E119 Type 2 diabetes mellitus without complications: Secondary | ICD-10-CM | POA: Diagnosis present

## 2013-09-01 DIAGNOSIS — Z888 Allergy status to other drugs, medicaments and biological substances status: Secondary | ICD-10-CM

## 2013-09-01 DIAGNOSIS — N259 Disorder resulting from impaired renal tubular function, unspecified: Secondary | ICD-10-CM

## 2013-09-01 DIAGNOSIS — E871 Hypo-osmolality and hyponatremia: Principal | ICD-10-CM | POA: Diagnosis present

## 2013-09-01 DIAGNOSIS — I5032 Chronic diastolic (congestive) heart failure: Secondary | ICD-10-CM | POA: Diagnosis present

## 2013-09-01 DIAGNOSIS — E861 Hypovolemia: Secondary | ICD-10-CM | POA: Diagnosis present

## 2013-09-01 DIAGNOSIS — E860 Dehydration: Secondary | ICD-10-CM

## 2013-09-01 DIAGNOSIS — I959 Hypotension, unspecified: Secondary | ICD-10-CM | POA: Diagnosis not present

## 2013-09-01 DIAGNOSIS — I2510 Atherosclerotic heart disease of native coronary artery without angina pectoris: Secondary | ICD-10-CM | POA: Diagnosis present

## 2013-09-01 DIAGNOSIS — I10 Essential (primary) hypertension: Secondary | ICD-10-CM | POA: Diagnosis present

## 2013-09-01 DIAGNOSIS — I129 Hypertensive chronic kidney disease with stage 1 through stage 4 chronic kidney disease, or unspecified chronic kidney disease: Secondary | ICD-10-CM | POA: Diagnosis present

## 2013-09-01 DIAGNOSIS — E785 Hyperlipidemia, unspecified: Secondary | ICD-10-CM | POA: Diagnosis present

## 2013-09-01 DIAGNOSIS — R42 Dizziness and giddiness: Secondary | ICD-10-CM

## 2013-09-01 DIAGNOSIS — I252 Old myocardial infarction: Secondary | ICD-10-CM

## 2013-09-01 DIAGNOSIS — G9340 Encephalopathy, unspecified: Secondary | ICD-10-CM | POA: Diagnosis present

## 2013-09-01 DIAGNOSIS — M109 Gout, unspecified: Secondary | ICD-10-CM | POA: Diagnosis present

## 2013-09-01 DIAGNOSIS — N184 Chronic kidney disease, stage 4 (severe): Secondary | ICD-10-CM

## 2013-09-01 DIAGNOSIS — N179 Acute kidney failure, unspecified: Secondary | ICD-10-CM

## 2013-09-01 DIAGNOSIS — N039 Chronic nephritic syndrome with unspecified morphologic changes: Secondary | ICD-10-CM

## 2013-09-01 DIAGNOSIS — D631 Anemia in chronic kidney disease: Secondary | ICD-10-CM | POA: Diagnosis present

## 2013-09-01 DIAGNOSIS — M1990 Unspecified osteoarthritis, unspecified site: Secondary | ICD-10-CM

## 2013-09-01 DIAGNOSIS — R609 Edema, unspecified: Secondary | ICD-10-CM

## 2013-09-01 DIAGNOSIS — I50811 Acute right heart failure: Secondary | ICD-10-CM

## 2013-09-01 DIAGNOSIS — R001 Bradycardia, unspecified: Secondary | ICD-10-CM

## 2013-09-01 DIAGNOSIS — I509 Heart failure, unspecified: Secondary | ICD-10-CM | POA: Diagnosis present

## 2013-09-01 DIAGNOSIS — E039 Hypothyroidism, unspecified: Secondary | ICD-10-CM | POA: Diagnosis present

## 2013-09-01 DIAGNOSIS — Z7982 Long term (current) use of aspirin: Secondary | ICD-10-CM

## 2013-09-01 DIAGNOSIS — D649 Anemia, unspecified: Secondary | ICD-10-CM | POA: Diagnosis present

## 2013-09-01 HISTORY — DX: Unspecified osteoarthritis, unspecified site: M19.90

## 2013-09-01 HISTORY — DX: Shortness of breath: R06.02

## 2013-09-01 HISTORY — DX: Unspecified diastolic (congestive) heart failure: I50.30

## 2013-09-01 LAB — BASIC METABOLIC PANEL
BUN: 33 mg/dL — ABNORMAL HIGH (ref 6–23)
CHLORIDE: 83 meq/L — AB (ref 96–112)
CO2: 21 meq/L (ref 19–32)
CREATININE: 2.34 mg/dL — AB (ref 0.50–1.10)
Calcium: 8.3 mg/dL — ABNORMAL LOW (ref 8.4–10.5)
GFR calc Af Amer: 20 mL/min — ABNORMAL LOW (ref >90)
GFR calc non Af Amer: 18 mL/min — ABNORMAL LOW (ref >90)
Glucose, Bld: 80 mg/dL (ref 70–99)
POTASSIUM: 4 meq/L (ref 3.7–5.3)
Sodium: 123 mEq/L — ABNORMAL LOW (ref 137–147)

## 2013-09-01 LAB — HEMOGLOBIN A1C
Hgb A1c MFr Bld: 5.5 % (ref <5.7)
Mean Plasma Glucose: 111 mg/dL (ref <117)

## 2013-09-01 LAB — CBC
HEMATOCRIT: 30.2 % — AB (ref 36.0–46.0)
HEMOGLOBIN: 10.7 g/dL — AB (ref 12.0–15.0)
MCH: 29.9 pg (ref 26.0–34.0)
MCHC: 35.4 g/dL (ref 30.0–36.0)
MCV: 84.4 fL (ref 78.0–100.0)
Platelets: 205 10*3/uL (ref 150–400)
RBC: 3.58 MIL/uL — AB (ref 3.87–5.11)
RDW: 13.3 % (ref 11.5–15.5)
WBC: 11 10*3/uL — ABNORMAL HIGH (ref 4.0–10.5)

## 2013-09-01 LAB — CREATININE, URINE, RANDOM: Creatinine, Urine: 52.99 mg/dL

## 2013-09-01 LAB — COMPREHENSIVE METABOLIC PANEL
ALBUMIN: 3.4 g/dL — AB (ref 3.5–5.2)
ALK PHOS: 68 U/L (ref 39–117)
ALT: 7 U/L (ref 0–35)
AST: 16 U/L (ref 0–37)
BILIRUBIN TOTAL: 0.7 mg/dL (ref 0.3–1.2)
BUN: 33 mg/dL — ABNORMAL HIGH (ref 6–23)
CHLORIDE: 82 meq/L — AB (ref 96–112)
CO2: 21 mEq/L (ref 19–32)
CREATININE: 2.46 mg/dL — AB (ref 0.50–1.10)
Calcium: 8.5 mg/dL (ref 8.4–10.5)
GFR calc Af Amer: 19 mL/min — ABNORMAL LOW (ref >90)
GFR calc non Af Amer: 17 mL/min — ABNORMAL LOW (ref >90)
GLUCOSE: 130 mg/dL — AB (ref 70–99)
POTASSIUM: 3.6 meq/L — AB (ref 3.7–5.3)
Sodium: 118 mEq/L — CL (ref 137–147)
Total Protein: 6.5 g/dL (ref 6.0–8.3)

## 2013-09-01 LAB — URINALYSIS, ROUTINE W REFLEX MICROSCOPIC
Bilirubin Urine: NEGATIVE
GLUCOSE, UA: NEGATIVE mg/dL
Hgb urine dipstick: NEGATIVE
KETONES UR: NEGATIVE mg/dL
Leukocytes, UA: NEGATIVE
Nitrite: NEGATIVE
Protein, ur: NEGATIVE mg/dL
Specific Gravity, Urine: 1.01 (ref 1.005–1.030)
UROBILINOGEN UA: 1 mg/dL (ref 0.0–1.0)
pH: 5 (ref 5.0–8.0)

## 2013-09-01 LAB — GLUCOSE, CAPILLARY
GLUCOSE-CAPILLARY: 67 mg/dL — AB (ref 70–99)
GLUCOSE-CAPILLARY: 63 mg/dL — AB (ref 70–99)
GLUCOSE-CAPILLARY: 80 mg/dL (ref 70–99)

## 2013-09-01 LAB — TROPONIN I: Troponin I: <0.3 ng/mL (ref <0.30)

## 2013-09-01 LAB — OSMOLALITY: Osmolality: 259 mOsm/kg — ABNORMAL LOW (ref 275–300)

## 2013-09-01 LAB — CBG MONITORING, ED
GLUCOSE-CAPILLARY: 89 mg/dL (ref 70–99)
Glucose-Capillary: 115 mg/dL — ABNORMAL HIGH (ref 70–99)

## 2013-09-01 LAB — TSH: TSH: 2.92 u[IU]/mL (ref 0.350–4.500)

## 2013-09-01 LAB — SODIUM, URINE, RANDOM: Sodium, Ur: 24 mEq/L

## 2013-09-01 MED ORDER — ONDANSETRON HCL 4 MG/2ML IJ SOLN: 4.0000 mg | Freq: Four times a day (QID) | INTRAMUSCULAR | Status: DC | PRN

## 2013-09-01 MED ORDER — LUBIPROSTONE 8 MCG PO CAPS
8.0000 ug | ORAL_CAPSULE | Freq: Two times a day (BID) | ORAL | Status: DC
  Administered 2013-09-01 – 2013-09-05 (×8): 8 ug via ORAL
  Filled 2013-09-01 (×9): qty 1

## 2013-09-01 MED ORDER — ALLOPURINOL 100 MG PO TABS
100.0000 mg | ORAL_TABLET | Freq: Every day | ORAL | Status: DC
  Administered 2013-09-02 – 2013-09-05 (×4): 100 mg via ORAL
  Filled 2013-09-01 (×4): qty 1

## 2013-09-01 MED ORDER — ACETAMINOPHEN 325 MG PO TABS: 650.0000 mg | ORAL_TABLET | Freq: Four times a day (QID) | ORAL | Status: DC | PRN

## 2013-09-01 MED ORDER — INSULIN ASPART 100 UNIT/ML ~~LOC~~ SOLN: 0.0000 [IU] | Freq: Three times a day (TID) | SUBCUTANEOUS | Status: DC

## 2013-09-01 MED ORDER — ONDANSETRON HCL 4 MG PO TABS: 4.0000 mg | ORAL_TABLET | Freq: Four times a day (QID) | ORAL | Status: DC | PRN

## 2013-09-01 MED ORDER — CARVEDILOL 3.125 MG PO TABS
3.1250 mg | ORAL_TABLET | Freq: Two times a day (BID) | ORAL | Status: DC
  Administered 2013-09-01 – 2013-09-02 (×3): 3.125 mg via ORAL
  Filled 2013-09-01 (×8): qty 1

## 2013-09-01 MED ORDER — OXYBUTYNIN CHLORIDE 5 MG PO TABS
5.0000 mg | ORAL_TABLET | Freq: Two times a day (BID) | ORAL | Status: DC
  Administered 2013-09-01 – 2013-09-04 (×6): 5 mg via ORAL
  Filled 2013-09-01 (×7): qty 1

## 2013-09-01 MED ORDER — SODIUM CHLORIDE 0.9 % IV SOLN
INTRAVENOUS | Status: DC
  Administered 2013-09-01: 18:00:00 via INTRAVENOUS

## 2013-09-01 MED ORDER — DOCUSATE SODIUM 100 MG PO CAPS
100.0000 mg | ORAL_CAPSULE | Freq: Two times a day (BID) | ORAL | Status: DC
  Administered 2013-09-01 – 2013-09-05 (×8): 100 mg via ORAL
  Filled 2013-09-01 (×10): qty 1

## 2013-09-01 MED ORDER — ACETAMINOPHEN 650 MG RE SUPP
650.0000 mg | Freq: Four times a day (QID) | RECTAL | Status: DC | PRN
Start: 2013-09-01 — End: 2013-09-05

## 2013-09-01 MED ORDER — ASPIRIN EC 81 MG PO TBEC
81.0000 mg | DELAYED_RELEASE_TABLET | Freq: Every day | ORAL | Status: DC
  Administered 2013-09-02 – 2013-09-05 (×4): 81 mg via ORAL
  Filled 2013-09-01 (×4): qty 1

## 2013-09-01 MED ORDER — ENOXAPARIN SODIUM 30 MG/0.3ML ~~LOC~~ SOLN
30.0000 mg | SUBCUTANEOUS | Status: DC
  Administered 2013-09-01 – 2013-09-04 (×4): 30 mg via SUBCUTANEOUS
  Filled 2013-09-01 (×5): qty 0.3

## 2013-09-01 MED ORDER — LEVOTHYROXINE SODIUM 50 MCG PO TABS
50.0000 ug | ORAL_TABLET | Freq: Every day | ORAL | Status: DC
  Administered 2013-09-02 – 2013-09-05 (×4): 50 ug via ORAL
  Filled 2013-09-01 (×5): qty 1

## 2013-09-01 MED ORDER — SODIUM CHLORIDE 0.9 % IV SOLN
INTRAVENOUS | Status: DC
  Administered 2013-09-01 – 2013-09-03 (×2): via INTRAVENOUS

## 2013-09-01 NOTE — Progress Notes (Signed)
Family not present at bedside. Unable to complete admission assessment due to pt alert and oriented x2. [name] [name], Janice J

## 2013-09-01 NOTE — ED Notes (Signed)
IV atempt unsucessful x2 by this RN. Will page IV team and phlebotomy.

## 2013-09-01 NOTE — ED Notes (Signed)
Family reports pt was here last week for same. Having some confusion, weakness, difficulty walking at home. Last week was diagnosed with yeast infection and dehydration and was dc home. Pt is awake and alert at triage, answering questions at triage.

## 2013-09-01 NOTE — ED Notes (Signed)
Iv team returned phone call. State they will be down shortly.

## 2013-09-01 NOTE — Progress Notes (Signed)
Hypoglycemic Event  CBG: 68   Treatment: 15 GM carbohydrate snack  Symptoms: None  Follow-up CBG: Time: 2050 result:63  Possible Reasons for Event: Inadequate meal intake  CBG: 63  Treatment: 15 GM gel  Symptoms: None  Follow-up CBG: 80  Possible Reasons for Event: Inadequate meal intake  Gilman Schmidtembrina, Janice J  Remember to initiate Hypoglycemia Order Set & complete

## 2013-09-01 NOTE — ED Notes (Signed)
Pt returned from radiology & IV team at bedside 

## 2013-09-01 NOTE — H&P (Addendum)
History and Physical  [name] [name] GNF:[id number] DOB: [birth year] DOA: 09/01/2013   PCP: Laurena Slimmer, MD   Chief Complaint: falls, lethargy  HPI:  80 year old female with a history of CAD with history of MI in 1999, hypertension, diabetes mellitus type 2, CKD stage IV presents with one to two-week history of generalized weakness, increased lethargy, and mechanical falls. The patient is a poor historian with suspected underlying dementia. All of this history is obtained from speaking with the patient's son and reviewed the medical record. In the past 2 weeks, the patient's son has noted increasing lethargy and sleepiness during the daytime. In addition, the patient has had decreased oral intake. There has not been any fevers, chills, chest pain, shortness breath, vomiting, diarrhea, hematochezia, melena. There is no dysuria, hematuria, rashes, headaches. The patient has chronic left upper extremity and left lower extremity weakness. At baseline, the patient needs full assistance of her son for all her activities of daily living. Given with the help of her son in the past 3-4 days, the patient has had 2 falls. In fact, the patient fell and hit her right knee.  The patient was brought to the emergency department on 08/26/2013 and with subsequent was sent home. The patient has not had any worsening shortness breath, orthopnea, lower extremity edema or increased abdominal girth. She last saw her primary care provider 3 months ago. Percent states that she has not been weighed since that period of time.  In the emergency department, the patient was found to have a sodium of 118, serum creatinine 2.46. Hepatic enzymes are normal. WBC was 11.0. Urinalysis was negative for pyuria. CT of the brain was negative for acute findings. Chest x-ray was negative. X-ray of the right knee showed moderate effusion with degenerative joint disease. Assessment/Plan: Hypovolemic hyponatremia -Clinically, the  patient appears hypovolemic -This is likely the cause of the patient's lethargy and falls -Hold furosemide -Gently hydrate with normal saline 50cc/hr;  I have ordered one liter only -BMP q 4 hrs to monitor Na rise -Serum osmolarity, urine osmolarity -Urine sodium, urine creatinine--although her urine studies may be compromised from the patient's furosemide -TSH Gait instability with frequent falls -PT evaluation Hypertension -Decrease carvedilol to 3.125 mg twice a day as the patient's blood pressure is soft Chronic diastolic heart failure/R-sided CHF -clinically appears dry--no peripheral edema, no JVD -Gently hydrate and hold furosemide as discussed above Diabetes mellitus type 2 -Hemoglobin A1c on 08/25/2012--5.4 -Repeat hemoglobin A1c -May not need to be restarted on any hypoglycemic agents -Discontinue glipizide for now -NovoLog sliding-scale Hypothyroidism  -Check TSH  -Continue Synthroid  Anemia of CKD -Patient is at her baseline, 9-10  -Continue to monitor  CKD stage IV -Baseline creatinine 2.4-2.6 Right knee pain/effusion -Likely due to the patient's recent fall hitting her right knee -conservative managment      Past Medical History  Diagnosis Date  . CAD (coronary artery disease)   . Gout   . Hyperlipemia   . Hypertension   . Diabetes mellitus without complication   . MI (myocardial infarction) August 1999    PCI RCA  . Chronic kidney disease, stage IV (severe)   . Anemia    Past Surgical History  Procedure Laterality Date  . Abdominal surgery    . Replacement total knee Left   . Total shoulder replacement Right   . Appendectomy    . Cholecystectomy    . Cardiac catheterization  1999    LAD OK, OM2 70%,  RCA 90/80% with spont dissection, S/P PCI RCA   Social History:  reports that she has never smoked. She does not have any smokeless tobacco history on file. She reports that she does not drink alcohol or use illicit drugs.   Family History    Problem Relation Age of Onset  . Arthritis Mother     Died at age 78  . Arthritis Sister      Allergies  Allergen Reactions  . Prinivil [Lisinopril]     Pt reports unaware of allergy      Prior to Admission medications   Medication Sig Start Date End Date Taking? Authorizing Provider  allopurinol (ZYLOPRIM) 100 MG tablet Take 100 mg by mouth daily.   Yes Historical Provider, MD  aspirin EC 81 MG tablet Take 81 mg by mouth daily.   Yes Historical Provider, MD  carvedilol (COREG) 6.25 MG tablet Take 1 tablet (6.25 mg total) by mouth 2 (two) times daily with a meal. 08/27/12  Yes Leroy SeaPrashant K Singh, MD  furosemide (LASIX) 80 MG tablet Take 80 mg by mouth 2 (two) times daily.   Yes Historical Provider, MD  glipiZIDE (GLUCOTROL XL) 2.5 MG 24 hr tablet Take 2.5 mg by mouth daily.   Yes Historical Provider, MD  glucose 4 GM chewable tablet Chew 16 g by mouth as needed for low blood sugar.   Yes Historical Provider, MD  levothyroxine (SYNTHROID, LEVOTHROID) 50 MCG tablet Take 50 mcg by mouth daily before breakfast.   Yes Historical Provider, MD  lubiprostone (AMITIZA) 8 MCG capsule Take 8 mcg by mouth 2 (two) times daily.    Yes Historical Provider, MD  nystatin-triamcinolone (MYCOLOG II) cream Apply 1 application topically daily as needed (antifungal).    Yes Historical Provider, MD  oxybutynin (DITROPAN) 5 MG tablet Take 5 mg by mouth 2 (two) times daily.   Yes Historical Provider, MD    Review of Systems:  Limited due to pt's encephalopathy  Physical Exam: Filed Vitals:   09/01/13 1545 09/01/13 1600 09/01/13 1630 09/01/13 1634  BP:  113/66 109/51   Pulse:    54  Temp:      TempSrc:      Resp: 18  21 20   SpO2:    100%   General:  A&O x 2, NAD, nontoxic, pleasant/cooperative Head/Eye: No conjunctival hemorrhage, no icterus, [place]/AT, No nystagmus ENT:  No icterus,  No thrush, no pharyngeal exudate Neck:  No masses, no lymphadenpathy, no bruits; no JVD CV:  RRR, no rub, no gallop,  no S3 Lung:  CTAB, good air movement, no wheeze, no rhonchi Abdomen: soft/NT, +BS, nondistended, no peritoneal signs Ext: No cyanosis, No rashes, No petechiae, No lymphangitis, No edema;  R-knee effusion without erythema   Labs on Admission:  Basic Metabolic Panel:  Recent Labs Lab 08/26/13 1652 09/01/13 1357  NA 127* 118*  K 3.6* 3.6*  CL 88* 82*  CO2 22 21  GLUCOSE 131* 130*  BUN 29* 33*  CREATININE 2.60* 2.46*  CALCIUM 8.9 8.5   Liver Function Tests:  Recent Labs Lab 09/01/13 1357  AST 16  ALT 7  ALKPHOS 68  BILITOT 0.7  PROT 6.5  ALBUMIN 3.4*   No results found for this basename: LIPASE, AMYLASE,  in the last 168 hours No results found for this basename: AMMONIA,  in the last 168 hours CBC:  Recent Labs Lab 08/26/13 1652 09/01/13 1357  WBC 9.1 11.0*  HGB 9.6* 10.7*  HCT 28.4* 30.2*  MCV 86.6  84.4  PLT 192 205   Cardiac Enzymes:  Recent Labs Lab 09/01/13 1357  TROPONINI <0.30   BNP: No components found with this basename: POCBNP,  CBG:  Recent Labs Lab 09/01/13 1327  GLUCAP 115*    Radiological Exams on Admission: Dg Chest 2 View  09/01/2013   CLINICAL DATA:  Status post fall.  Altered mental status.  EXAM: CHEST  2 VIEW  COMPARISON:  Single view of the chest 08/24/2012.  FINDINGS: The patient is rotated on the study. Lungs are clear. There is cardiomegaly. No pneumothorax or pleural effusion. Right shoulder replacement is noted. The left humeral has been removed.  IMPRESSION: No acute abnormality.   Electronically Signed   By: Drusilla Kannerhomas  Dalessio M.D.   On: 09/01/2013 14:27   Ct Head Wo Contrast  09/01/2013   CLINICAL DATA:  Fall, altered mental status  EXAM: CT HEAD WITHOUT CONTRAST  TECHNIQUE: Contiguous axial images were obtained from the base of the skull through the vertex without intravenous contrast.  COMPARISON:  05/17/2007  FINDINGS: There is no evidence of mass effect, midline shift, or extra-axial fluid collections. There is no  evidence of a space-occupying lesion or intracranial hemorrhage. There is no evidence of a cortical-based area of acute infarction. There is generalized cerebral atrophy. There is periventricular white matter low attenuation likely secondary to microangiopathy.  The ventricles and sulci are appropriate for the patient's age. The basal cisterns are patent.  Visualized portions of the orbits are unremarkable. The visualized portions of the paranasal sinuses and mastoid air cells are unremarkable. Cerebrovascular atherosclerotic calcifications are noted.  The osseous structures are unremarkable.  IMPRESSION: No acute intracranial pathology.   Electronically Signed   By: Elige KoHetal  Patel   On: 09/01/2013 14:39   Dg Knee Complete 4 Views Right  09/01/2013   CLINICAL DATA:  Larey SeatFell.  Right knee pain.  EXAM: RIGHT KNEE - COMPLETE 4+ VIEW  COMPARISON:  None.  FINDINGS: Advanced tricompartmental degenerative changes with joint space narrowing, osteophytic spurring, subchondral cystic change and bony eburnation. There is also a moderate-sized joint effusion. No acute bony abnormality.  IMPRESSION: Advanced tricompartmental degenerative changes and moderate-sized joint effusion.   Electronically Signed   By: Loralie ChampagneMark  Gallerani M.D.   On: 09/01/2013 14:27    EKG: Independently reviewed. Sinus rhythm, nonspecific ST-T wave changes    Time spent:70 minutes Code Status:   FULL Family Communication:   Son at bedside   Lijah Bourque, DO  [facility] Pager [phone]  If 7PM-7AM, please contact night-coverage [email or web address] Password [email or web address] 09/01/2013, 4:37 PM

## 2013-09-01 NOTE — ED Provider Notes (Signed)
CSN: [id number]     Arrival date & time 09/01/13  1134 History   First MD Initiated Contact with Patient 09/01/13 1325     Chief Complaint  Patient presents with  . Fall  . Altered Mental Status      HPI Pt was seen at 1330. Per pt and her son, c/o gradual onset and worsening of persistent generalized weakness for the past 1 to 2 weeks. Pt's son states pt has had 2 falls in the past 3 days due to her "legs giving out," despite walking with her cane/walker. Pt has fallen onto her right knee. Pt's family states pt has been sleeping more than usual, as well as having intermittent "confusion." Pt lives with her son. Pt was due to have home PT visit tomorrow, but pt's son states he "can't take care of her like this." Denies CP/palpitations, no SOB/cough, no abd pain, no N/V/D, no focal motor weakness, no tingling/numbness in extremities.    Past Medical History  Diagnosis Date  . CAD (coronary artery disease)   . Gout   . Hyperlipemia   . Hypertension   . Diabetes mellitus without complication   . MI (myocardial infarction) August 1999    PCI RCA  . Chronic kidney disease, stage IV (severe)   . Anemia    Past Surgical History  Procedure Laterality Date  . Abdominal surgery    . Replacement total knee Left   . Total shoulder replacement Right   . Appendectomy    . Cholecystectomy    . Cardiac catheterization  1999    LAD OK, OM2 70%, RCA 90/80% with spont dissection, S/P PCI RCA   Family History  Problem Relation Age of Onset  . Arthritis Mother     Died at age 78  . Arthritis Sister    History  Substance Use Topics  . Smoking status: Never Smoker   . Smokeless tobacco: Not on file  . Alcohol Use: No    Review of Systems ROS: Statement: All systems negative except as marked or noted in the HPI; Constitutional: Negative for fever and chills. ; ; Eyes: Negative for eye pain, redness and discharge. ; ; ENMT: Negative for ear pain, hoarseness, nasal congestion, sinus pressure  and sore throat. ; ; Cardiovascular: Negative for chest pain, palpitations, diaphoresis, dyspnea and peripheral edema. ; ; Respiratory: Negative for cough, wheezing and stridor. ; ; Gastrointestinal: Negative for nausea, vomiting, diarrhea, abdominal pain, blood in stool, hematemesis, jaundice and rectal bleeding. . ; ; Genitourinary: Negative for dysuria, flank pain and hematuria. ; ; Musculoskeletal: Negative for back pain and neck pain. Negative for swelling and trauma.; ; Skin: Negative for pruritus, rash, abrasions, blisters, bruising and skin lesion.; ; Neuro: +generalized weakness, lethargy, confusion. Negative for headache, lightheadedness and neck stiffness. Negative for extremity weakness, paresthesias, involuntary movement, seizure and syncope.      Allergies  Prinivil  Home Medications   Prior to Admission medications   Medication Sig Start Date End Date Taking? Authorizing Provider  allopurinol (ZYLOPRIM) 100 MG tablet Take 100 mg by mouth daily.   Yes Historical Provider, MD  aspirin EC 81 MG tablet Take 81 mg by mouth daily.   Yes Historical Provider, MD  carvedilol (COREG) 6.25 MG tablet Take 1 tablet (6.25 mg total) by mouth 2 (two) times daily with a meal. 08/27/12  Yes Leroy SeaPrashant K Singh, MD  furosemide (LASIX) 80 MG tablet Take 80 mg by mouth 2 (two) times daily.   Yes Historical  Provider, MD  glipiZIDE (GLUCOTROL XL) 2.5 MG 24 hr tablet Take 2.5 mg by mouth daily.   Yes Historical Provider, MD  glucose 4 GM chewable tablet Chew 16 g by mouth as needed for low blood sugar.   Yes Historical Provider, MD  levothyroxine (SYNTHROID, LEVOTHROID) 50 MCG tablet Take 50 mcg by mouth daily before breakfast.   Yes Historical Provider, MD  lubiprostone (AMITIZA) 8 MCG capsule Take 8 mcg by mouth 2 (two) times daily.    Yes Historical Provider, MD  nystatin-triamcinolone (MYCOLOG II) cream Apply 1 application topically daily as needed (antifungal).    Yes Historical Provider, MD  oxybutynin  (DITROPAN) 5 MG tablet Take 5 mg by mouth 2 (two) times daily.   Yes Historical Provider, MD   BP 110/65  Pulse 55  Temp(Src) 97.8 F (36.6 C) (Oral)  Resp 28  SpO2 95% Physical Exam 1335: Physical examination:  Nursing notes reviewed; Vital signs and O2 SAT reviewed;  Constitutional: Well developed, Well nourished, In no acute distress; Head:  Normocephalic, atraumatic; Eyes: EOMI, PERRL, No scleral icterus; ENMT: Mouth and pharynx normal, Mucous membranes dry; Neck: Supple, Full range of motion, No lymphadenopathy; Cardiovascular: Regular rate and rhythm, No gallop; Respiratory: Breath sounds clear & equal bilaterally, No rales, rhonchi, wheezes.  Speaking full sentences with ease, Normal respiratory effort/excursion; Chest: Nontender, Movement normal; Abdomen: Soft, Nontender, Nondistended, Normal bowel sounds; Genitourinary: No CVA tenderness; Extremities: Pulses normal, Pelvis stable. NT bilat hips/knees/ankles/feet. No open wounds, no erythema, no ecchymosis. No deformity. No tenderness, +mild edema to right knee. No calf edema or asymmetry.; Neuro: AA&Ox3, Major CN grossly intact.  Speech clear. Moves all extremities on stretcher spontaneously and to command without apparent gross focal motor deficits.; Skin: Color normal, Warm, Dry.   ED Course  Procedures     EKG Interpretation None      MDM  MDM Reviewed: previous chart, nursing note and vitals Reviewed previous: labs and ECG Interpretation: labs, ECG, x-ray and CT scan Total time providing critical care: 30-74 minutes. This excludes time spent performing separately reportable procedures and services. Consults: admitting MD    CRITICAL CARE Performed by: Laray Anger Total critical care time: 35 Critical care time was exclusive of separately billable procedures and treating other patients. Critical care was necessary to treat or prevent imminent or life-threatening deterioration. Critical care was time spent  personally by me on the following activities: development of treatment plan with patient and/or surrogate as well as nursing, discussions with consultants, evaluation of patient's response to treatment, examination of patient, obtaining history from patient or surrogate, ordering and performing treatments and interventions, ordering and review of laboratory studies, ordering and review of radiographic studies, pulse oximetry and re-evaluation of patient's condition.   Results for orders placed during the hospital encounter of 09/01/13  CBC      Result Value Ref Range   WBC 11.0 (*) 4.0 - 10.5 K/uL   RBC 3.58 (*) 3.87 - 5.11 MIL/uL   Hemoglobin 10.7 (*) 12.0 - 15.0 g/dL   HCT 40.9 (*) 81.1 - 91.4 %   MCV 84.4  78.0 - 100.0 fL   MCH 29.9  26.0 - 34.0 pg   MCHC 35.4  30.0 - 36.0 g/dL   RDW 78.2  95.6 - 21.3 %   Platelets 205  150 - 400 K/uL  COMPREHENSIVE METABOLIC PANEL      Result Value Ref Range   Sodium 118 (*) 137 - 147 mEq/L   Potassium 3.6 (*)  3.7 - 5.3 mEq/L   Chloride 82 (*) 96 - 112 mEq/L   CO2 21  19 - 32 mEq/L   Glucose, Bld 130 (*) 70 - 99 mg/dL   BUN 33 (*) 6 - 23 mg/dL   Creatinine, Ser 1.61 (*) 0.50 - 1.10 mg/dL   Calcium 8.5  8.4 - 09.6 mg/dL   Total Protein 6.5  6.0 - 8.3 g/dL   Albumin 3.4 (*) 3.5 - 5.2 g/dL   AST 16  0 - 37 U/L   ALT 7  0 - 35 U/L   Alkaline Phosphatase 68  39 - 117 U/L   Total Bilirubin 0.7  0.3 - 1.2 mg/dL   GFR calc non Af Amer 17 (*) >90 mL/min   GFR calc Af Amer 19 (*) >90 mL/min  URINALYSIS, ROUTINE W REFLEX MICROSCOPIC      Result Value Ref Range   Color, Urine YELLOW  YELLOW   APPearance CLEAR  CLEAR   Specific Gravity, Urine 1.010  1.005 - 1.030   pH 5.0  5.0 - 8.0   Glucose, UA NEGATIVE  NEGATIVE mg/dL   Hgb urine dipstick NEGATIVE  NEGATIVE   Bilirubin Urine NEGATIVE  NEGATIVE   Ketones, ur NEGATIVE  NEGATIVE mg/dL   Protein, ur NEGATIVE  NEGATIVE mg/dL   Urobilinogen, UA 1.0  0.0 - 1.0 mg/dL   Nitrite NEGATIVE  NEGATIVE    Leukocytes, UA NEGATIVE  NEGATIVE  TROPONIN I      Result Value Ref Range   Troponin I <0.30  <0.30 ng/mL  CBG MONITORING, ED      Result Value Ref Range   Glucose-Capillary 115 (*) 70 - 99 mg/dL   Dg Chest 2 View 0/45/4098   CLINICAL DATA:  Status post fall.  Altered mental status.  EXAM: CHEST  2 VIEW  COMPARISON:  Single view of the chest 08/24/2012.  FINDINGS: The patient is rotated on the study. Lungs are clear. There is cardiomegaly. No pneumothorax or pleural effusion. Right shoulder replacement is noted. The left humeral has been removed.  IMPRESSION: No acute abnormality.   Electronically Signed   By: Drusilla Kanner M.D.   On: 09/01/2013 14:27   Ct Head Wo Contrast 09/01/2013   CLINICAL DATA:  Fall, altered mental status  EXAM: CT HEAD WITHOUT CONTRAST  TECHNIQUE: Contiguous axial images were obtained from the base of the skull through the vertex without intravenous contrast.  COMPARISON:  05/17/2007  FINDINGS: There is no evidence of mass effect, midline shift, or extra-axial fluid collections. There is no evidence of a space-occupying lesion or intracranial hemorrhage. There is no evidence of a cortical-based area of acute infarction. There is generalized cerebral atrophy. There is periventricular white matter low attenuation likely secondary to microangiopathy.  The ventricles and sulci are appropriate for the patient's age. The basal cisterns are patent.  Visualized portions of the orbits are unremarkable. The visualized portions of the paranasal sinuses and mastoid air cells are unremarkable. Cerebrovascular atherosclerotic calcifications are noted.  The osseous structures are unremarkable.  IMPRESSION: No acute intracranial pathology.   Electronically Signed   By: Elige Ko   On: 09/01/2013 14:39   Dg Knee Complete 4 Views Right 09/01/2013   CLINICAL DATA:  Larey Seat.  Right knee pain.  EXAM: RIGHT KNEE - COMPLETE 4+ VIEW  COMPARISON:  None.  FINDINGS: Advanced tricompartmental  degenerative changes with joint space narrowing, osteophytic spurring, subchondral cystic change and bony eburnation. There is also a moderate-sized joint effusion. No  acute bony abnormality.  IMPRESSION: Advanced tricompartmental degenerative changes and moderate-sized joint effusion.   Electronically Signed   By: Loralie ChampagneMark  Gallerani M.D.   On: 09/01/2013 14:27    Results for Jene EverySTANBACK, Nakhia M (MRN [id number]) as of 09/01/2013 15:09  Ref. Range 08/26/2012 05:20 08/27/2012 05:00 08/26/2013 16:52 09/01/2013 13:57  Sodium Latest Range: 137-147 mEq/L 136 138 127 (L) 118 (LL)    Results for Jene EverySTANBACK, Chestina M (MRN [id number]) as of 09/01/2013 15:09  Ref. Range 08/26/2012 05:20 08/27/2012 05:00 08/26/2013 16:52 09/01/2013 13:57  BUN Latest Range: 6-23 mg/dL 57 (H) 56 (H) 29 (H) 33 (H)  Creatinine Latest Range: 0.50-1.10 mg/dL 7.823.36 (H) 9.563.02 (H) 2.132.60 (H) 2.46 (H)     1545:   Pt with worsening hyponatremia; will dose judicious IVF. Dx and testing d/w pt and family.  Questions answered.  Verb understanding, agreeable to admit.  T/C to [facility]. Arbutus Leasat, case discussed, including:  HPI, pertinent PM/SHx, VS/PE, dx testing, ED course and treatment:  Agreeable to admit, requests he will come to the ED for eval.   Laray AngerKathleen M Autum Benfer, DO 09/04/13 1525

## 2013-09-02 ENCOUNTER — Encounter (HOSPITAL_COMMUNITY): Payer: Self-pay | Admitting: General Practice

## 2013-09-02 DIAGNOSIS — I359 Nonrheumatic aortic valve disorder, unspecified: Secondary | ICD-10-CM

## 2013-09-02 LAB — URINE CULTURE
COLONY COUNT: NO GROWTH
CULTURE: NO GROWTH

## 2013-09-02 LAB — BASIC METABOLIC PANEL
BUN: 33 mg/dL — ABNORMAL HIGH (ref 6–23)
BUN: 32 mg/dL — AB (ref 6–23)
BUN: 32 mg/dL — ABNORMAL HIGH (ref 6–23)
CALCIUM: 8.1 mg/dL — AB (ref 8.4–10.5)
CALCIUM: 8 mg/dL — AB (ref 8.4–10.5)
CO2: 22 meq/L (ref 19–32)
CO2: 21 mEq/L (ref 19–32)
CO2: 18 mEq/L — ABNORMAL LOW (ref 19–32)
CREATININE: 2.23 mg/dL — AB (ref 0.50–1.10)
Calcium: 8.3 mg/dL — ABNORMAL LOW (ref 8.4–10.5)
Chloride: 84 mEq/L — ABNORMAL LOW (ref 96–112)
Chloride: 86 mEq/L — ABNORMAL LOW (ref 96–112)
Chloride: 86 mEq/L — ABNORMAL LOW (ref 96–112)
Creatinine, Ser: 2.29 mg/dL — ABNORMAL HIGH (ref 0.50–1.10)
Creatinine, Ser: 2.14 mg/dL — ABNORMAL HIGH (ref 0.50–1.10)
GFR calc Af Amer: 21 mL/min — ABNORMAL LOW (ref >90)
GFR calc Af Amer: 22 mL/min — ABNORMAL LOW (ref >90)
GFR calc Af Amer: 23 mL/min — ABNORMAL LOW (ref >90)
GFR calc non Af Amer: 18 mL/min — ABNORMAL LOW (ref >90)
GFR calc non Af Amer: 20 mL/min — ABNORMAL LOW (ref >90)
GFR, EST NON AFRICAN AMERICAN: 19 mL/min — AB (ref >90)
GLUCOSE: 101 mg/dL — AB (ref 70–99)
GLUCOSE: 64 mg/dL — AB (ref 70–99)
GLUCOSE: 104 mg/dL — AB (ref 70–99)
POTASSIUM: 3.5 meq/L — AB (ref 3.7–5.3)
Potassium: 3.5 mEq/L — ABNORMAL LOW (ref 3.7–5.3)
Potassium: 3.8 mEq/L (ref 3.7–5.3)
Sodium: 122 mEq/L — ABNORMAL LOW (ref 137–147)
Sodium: 124 mEq/L — ABNORMAL LOW (ref 137–147)
Sodium: 121 mEq/L — CL (ref 137–147)

## 2013-09-02 LAB — GLUCOSE, CAPILLARY
Glucose-Capillary: 74 mg/dL (ref 70–99)
Glucose-Capillary: 71 mg/dL (ref 70–99)
Glucose-Capillary: 66 mg/dL — ABNORMAL LOW (ref 70–99)
Glucose-Capillary: 81 mg/dL (ref 70–99)

## 2013-09-02 LAB — CBC
HCT: 28.9 % — ABNORMAL LOW (ref 36.0–46.0)
HEMOGLOBIN: 10.1 g/dL — AB (ref 12.0–15.0)
MCH: 30.1 pg (ref 26.0–34.0)
MCHC: 34.9 g/dL (ref 30.0–36.0)
MCV: 86 fL (ref 78.0–100.0)
Platelets: 188 10*3/uL (ref 150–400)
RBC: 3.36 MIL/uL — ABNORMAL LOW (ref 3.87–5.11)
RDW: 13.7 % (ref 11.5–15.5)
WBC: 10.3 10*3/uL (ref 4.0–10.5)

## 2013-09-02 LAB — PRO B NATRIURETIC PEPTIDE: Pro B Natriuretic peptide (BNP): 3987 pg/mL — ABNORMAL HIGH (ref 0–450)

## 2013-09-02 NOTE — Progress Notes (Signed)
  Echocardiogram 2D Echocardiogram has been performed.  CHUNG, KWONG 09/02/2013, 11:14 AM

## 2013-09-02 NOTE — Progress Notes (Signed)
CRITICAL VALUE ALERT  Critical value received:  Sodium 121  Date of notification:  09/02/13  Time of notification:  17:37  Critical value read back:Yes.    Nurse who received alert:  Dianna LimboAshley Avila Albritton   MD notified (1st page):  09/02/13  Time of first page:  17:37  Responding MD:  Susie CassetteAbrol  Time MD responded:  17:41

## 2013-09-02 NOTE — Progress Notes (Signed)
Hypoglycemic Event  CBG: 62  Treatment: carbohydrate snack  Symptoms: None  Follow-up CBG:  CBG Result: 82  Possible Reasons for Event: Inadequate meal intake   Gilman Schmidtembrina, Janice J  Remember to initiate Hypoglycemia Order Set & complete

## 2013-09-02 NOTE — Progress Notes (Signed)
[facility]  Patient Status: Active (receiving services up to time of hospitalization)  [facility] is providing the following services: PT, OT and HHA  If patient discharges after hours, please call [phone].   Sherryll BurgerStephanie M George 09/02/2013, 9:41 AM

## 2013-09-02 NOTE — Evaluation (Signed)
Physical Therapy Evaluation Patient Details Name: [name] [name] MRN: [id number] DOB: 12/21/1923 Today's Date: 09/02/2013   History of Present Illness  80 year old female with a history of CAD with history of MI in 1999, hypertension, diabetes mellitus type 2, CKD stage IV presents with one to two-week history of generalized weakness, increased lethargy, and mechanical falls. The patient is a poor historian with suspected underlying dementia. All of this history is obtained from speaking with the patient's son and reviewed the medical record. In the past 2 weeks, the patient's son has noted increasing lethargy and sleepiness during the daytime. In addition, the patient has had decreased oral intake. There has not been any fevers, chills, chest pain, shortness breath, vomiting, diarrhea, hematochezia, melena. There is no dysuria, hematuria, rashes, headaches. The patient has chronic left upper extremity and left lower extremity weakness. At baseline, the patient needs full assistance of her son for all her activities of daily living. Given with the help of her son in the past 3-4 days, the patient has had 2 falls. In fact, the patient fell and hit her right knee.   Clinical Impression   Pt admitted with above. Pt currently with functional limitations due to the deficits listed below (see PT Problem List).  Pt will benefit from skilled PT to increase their independence and safety with mobility to allow discharge to the venue listed below.       Follow Up Recommendations SNF;Supervision/Assistance - 24 hour    Equipment Recommendations  Other (comment) (See barriers to dc comments)    Recommendations for Other Services       Precautions / Restrictions Precautions Precautions: Fall Restrictions Weight Bearing Restrictions: No      Mobility  Bed Mobility Overal bed mobility: Needs Assistance Bed Mobility: Supine to Sit     Supine to sit: Max assist     General bed mobility comments:  Max assist with use of bed pad to clear feet from EOB, and max assist with suport given at L plvic girdle and R shoulder girdle in a force couple to help pt come to sit  Transfers Overall transfer level: Needs assistance Equipment used:  Scientist, research (medical)(Gait Belt) Transfers: Lateral/Scoot Transfers          Lateral/Scoot Transfers: Total assist General transfer comment: Required total assist with knees blocked and tight hold of gait belt; total assist to weight shift off of buttocks for scooting (armrest down)  Ambulation/Gait                Stairs            Wheelchair Mobility    Modified Rankin (Stroke Patients Only)       Balance     Sitting balance-Leahy Scale: Poor Sitting balance - Comments: tending to right lean, and at one poitn was pulling to lay back down                                     Pertinent Vitals/Pain Grimace with any motion of R knee patient repositioned for comfort     Home Living Family/patient expects to be discharged to:: Private residence Living Arrangements: Children Available Help at Discharge: Family;Available 24 hours/day Type of Home: House Home Access: Stairs to enter Entrance Stairs-Rails: Right Entrance Stairs-Number of Steps: 2-3 Home Layout: One level Home Equipment: Cane - single point;Walker - 4 wheels;Shower seat Additional Comments: All of the above is per  previous PT notes, from 1 year ago; Pt is a poor historian    Prior Function Level of Independence: Needs assistance         Comments: Per H&P, pt nees assist with all ADLs; Nurse Extern informed me she was able to feed herself dinner yesterday     Hand Dominance        Extremity/Trunk Assessment   Upper Extremity Assessment: Generalized weakness (noted h/o L UE weakness)           Lower Extremity Assessment: Generalized weakness;RLE deficits/detail RLE Deficits / Details: Noted edema R knee; applied ACE wrap       Communication    Communication: Other (comment) (h/o dementia; did not talk much, very sleepy)  Cognition Arousal/Alertness: Lethargic (Per RN, pt did not sleep well last night) Behavior During Therapy: Flat affect Overall Cognitive Status: No family/caregiver present to determine baseline cognitive functioning       Memory: Decreased short-term memory (with h/o dementia, but able to state, "It's early, I'm tired, I want you to go" once she was in the chair              General Comments      Exercises        Assessment/Plan    PT Assessment Patient needs continued PT services  PT Diagnosis Generalized weakness;Acute pain   PT Problem List Decreased strength;Decreased range of motion;Decreased activity tolerance;Decreased balance;Decreased mobility;Decreased coordination;Decreased cognition;Decreased knowledge of use of DME;Decreased safety awareness;Pain  PT Treatment Interventions DME instruction;Gait training;Functional mobility training;Therapeutic activities;Therapeutic exercise;Balance training;Neuromuscular re-education;Cognitive remediation;Patient/family education;Wheelchair mobility training   PT Goals (Current goals can be found in the Care Plan section) Acute Rehab PT Goals Patient Stated Goal: did not state PT Goal Formulation: Patient unable to participate in goal setting Time For Goal Achievement: 09/16/13 Potential to Achieve Goals: Good    Frequency Min 3X/week   Barriers to discharge Decreased caregiver support Will need more info re: caregiver support; at current functional level, pt is appropriate for SNF level of care; I am interested in finding out her son and family's opinion re: SNF versus home; If family is leaning towrds home, we will need to consider hospital bed and hoyer lift, wheelchair, and ambulance or medical Zenaida Niecevan transport home    Co-evaluation               End of Session Equipment Utilized During Treatment: Gait belt Activity Tolerance: Patient  limited by lethargy;Patient limited by pain Patient left: in chair;with call bell/phone within reach;with chair alarm set Nurse Communication: Mobility status;Other (comment) (noted incontinent of urine in the bed)         Time: [id number] PT Time Calculation (min): 27 min   Charges:   PT Evaluation $Initial PT Evaluation Tier I: 1 Procedure PT Treatments $Therapeutic Activity: 8-22 mins   PT G Codes:          Van ClinesGarrigan, Holly Hamff 09/02/2013, 10:01 AM Van ClinesHolly Garrigan, PT  Acute Rehabilitation Services Pager [phone] Office [phone]

## 2013-09-02 NOTE — Progress Notes (Addendum)
[facility] PROGRESS NOTE  Jene EveryFerne M Trudo WGN:[id number] DOB: 12/18/1923 DOA: 09/01/2013 PCP: Laurena SlimmerLARK,PRESTON S, MD  Assessment/Plan: Active Problems:   DIABETES MELLITUS   HYPERLIPIDEMIA   HYPONATREMIA   HYPERTENSION   CAD   Anemia   Chronic diastolic heart failure   Hyponatremia   CKD (chronic kidney disease) stage 4, GFR 15-29 ml/min    80 year old female with a history of CAD with history of MI in 1999, hypertension, diabetes mellitus type 2, CKD stage IV presents with one to two-week history of generalized weakness, increased lethargy, and mechanical falls. The patient is a poor historian with suspected underlying dementia. All of this history is obtained from speaking with the patient's son and reviewed the medical record. In the past 2 weeks, the patient's son has noted increasing lethargy and sleepiness during the daytime. In addition, the patient has had decreased oral intake. There has not been any fevers, chills, chest pain, shortness breath, vomiting, diarrhea, hematochezia, melena. There is no dysuria, hematuria, rashes, headaches. The patient has chronic left upper extremity and left lower extremity weakness. At baseline, the patient needs full assistance of her son for all her activities of daily living. Given with the help of her son in the past 3-4 days, the patient has had 2 falls. In fact, the patient fell and hit her right knee. The patient was brought to the emergency department on 08/26/2013 and with subsequent was sent home. The patient has not had any worsening shortness breath, orthopnea, lower extremity edema or increased abdominal girth. She last saw her primary care provider 3 months ago. Percent states that she has not been weighed since that period of time.  In the emergency department, the patient was found to have a sodium of 118, serum creatinine 2.46. Hepatic enzymes are normal. WBC was 11.0. Urinalysis was negative for pyuria. CT of the brain was negative  for acute findings. Chest x-ray was negative. X-ray of the right knee showed moderate effusion with degenerative joint disease.    Assessment/Plan:  Hypovolemic hyponatremia -improving-sodium was normal in June 2014 -Clinically, the patient appears hypovolemic  -This is likely the cause of the patient's lethargy and falls  -Hold furosemide  -Gently hydrate with normal saline, increase to 75 cc/hr; -BMP q. 12 hours -Serum osmolarity, urine osmolarity low  -Urine sodium, urine creatinine--although her urine studies may be compromised from the patient's furosemide  -TSH is normal Check BNP, low sodium could be secondary to underlying congestive heart failure, although the patient does not appear to have an exacerbation today. Last 2-D echo showed an EF of 55-60% on 6/14. We'll repeat 2-D echo Fluid restriction to 1200 cc  Gait instability with frequent falls  -PT evaluation   Hypertension  -Decrease carvedilol to 3.125 mg twice a day as the patient's blood pressure is soft   Chronic diastolic heart failure/R-sided CHF  -clinically appears dry--no peripheral edema, no JVD  -Gently hydrate and hold furosemide as discussed above   Diabetes mellitus type 2  -Hemoglobin A1c on 08/25/2012--5.4  -Repeat hemoglobin A1c  -May not need to be restarted on any hypoglycemic agents  -Discontinue glipizide for now  -NovoLog sliding-scale    Hypothyroidism  TSH normal -Continue Synthroid   Anemia of CKD  -Patient is at her baseline, 9-10  -Continue to monitor   CKD stage IV  -Baseline creatinine 2.4-2.6   Right knee pain/effusion  -Likely due to the patient's recent fall hitting her right knee  -conservative managment  X ray shows Advanced  tricompartmental degenerative changes and moderate-sized  joint effusion.   Disposition depending on progress as far as the patient's sodium is concerned, PT OT eval  HPI/Subjective: Oriented this morning, lying comfortably in bed  ,  Objective: Filed Vitals:   09/01/13 1700 09/01/13 1801 09/01/13 2027 09/02/13 0406  BP: 107/51 101/51 115/40 119/74  Pulse:  52 67 66  Temp:  97.7 F (36.5 C) 97.7 F (36.5 C) 98 F (36.7 C)  TempSrc:  Oral    Resp: 15 18 18 19   Height:   5\' 5"  (1.651 m)   Weight:   80.7 kg (177 lb 14.6 oz)   SpO2: 99%  96% 97%    Intake/Output Summary (Last 24 hours) at 09/02/13 0915 Last data filed at 09/01/13 2100  Gross per 24 hour  Intake    440 ml  Output    300 ml  Net    140 ml    Exam: General: A&O x 2, NAD, nontoxic, pleasant/cooperative  Head/Eye: No conjunctival hemorrhage, no icterus, [place]/AT, No nystagmus  ENT: No icterus, No thrush, no pharyngeal exudate  Neck: No masses, no lymphadenpathy, no bruits; no JVD  CV: RRR, no rub, no gallop, no S3  Lung: CTAB, good air movement, no wheeze, no rhonchi  Abdomen: soft/NT, +BS, nondistended, no peritoneal signs  Ext: No cyanosis, No rashes, No petechiae, No lymphangitis, No edema; R-knee effusion without erythema    Data Reviewed: Basic Metabolic Panel:  Recent Labs Lab 08/26/13 1652 09/01/13 1357 09/01/13 2205 09/02/13 0204  NA 127* 118* 123* 122*  K 3.6* 3.6* 4.0 3.5*  CL 88* 82* 83* 84*  CO2 22 21 21 22   GLUCOSE 131* 130* 80 101*  BUN 29* 33* 33* 33*  CREATININE 2.60* 2.46* 2.34* 2.29*  CALCIUM 8.9 8.5 8.3* 8.1*    Liver Function Tests:  Recent Labs Lab 09/01/13 1357  AST 16  ALT 7  ALKPHOS 68  BILITOT 0.7  PROT 6.5  ALBUMIN 3.4*   No results found for this basename: LIPASE, AMYLASE,  in the last 168 hours No results found for this basename: AMMONIA,  in the last 168 hours  CBC:  Recent Labs Lab 08/26/13 1652 09/01/13 1357  WBC 9.1 11.0*  HGB 9.6* 10.7*  HCT 28.4* 30.2*  MCV 86.6 84.4  PLT 192 205    Cardiac Enzymes:  Recent Labs Lab 09/01/13 1357  TROPONINI <0.30   BNP (last 3 results) No results found for this basename: PROBNP,  in the last 8760 hours   CBG:  Recent  Labs Lab 09/01/13 1646 09/01/13 2026 09/01/13 2124 09/01/13 2234 09/02/13 0755  GLUCAP 89 67* 63* 80 74    Recent Results (from the past 240 hour(s))  URINE CULTURE     Status: None   Collection Time    08/26/13  7:03 PM      Result Value Ref Range Status   Specimen Description URINE, RANDOM   Final   Special Requests NONE   Final   Culture  Setup Time     Final   Value: 08/27/2013 01:26     Performed at [facility] Count     Final   Value: NO GROWTH     Performed at [facility]   Culture     Final   Value: NO GROWTH     Performed at [facility]   Report Status 08/27/2013 FINAL   Final     Studies: Dg Chest 2  View  09/01/2013   CLINICAL DATA:  Status post fall.  Altered mental status.  EXAM: CHEST  2 VIEW  COMPARISON:  Single view of the chest 08/24/2012.  FINDINGS: The patient is rotated on the study. Lungs are clear. There is cardiomegaly. No pneumothorax or pleural effusion. Right shoulder replacement is noted. The left humeral has been removed.  IMPRESSION: No acute abnormality.   Electronically Signed   By: Drusilla Kannerhomas  Dalessio M.D.   On: 09/01/2013 14:27   Ct Head Wo Contrast  09/01/2013   CLINICAL DATA:  Fall, altered mental status  EXAM: CT HEAD WITHOUT CONTRAST  TECHNIQUE: Contiguous axial images were obtained from the base of the skull through the vertex without intravenous contrast.  COMPARISON:  05/17/2007  FINDINGS: There is no evidence of mass effect, midline shift, or extra-axial fluid collections. There is no evidence of a space-occupying lesion or intracranial hemorrhage. There is no evidence of a cortical-based area of acute infarction. There is generalized cerebral atrophy. There is periventricular white matter low attenuation likely secondary to microangiopathy.  The ventricles and sulci are appropriate for the patient's age. The basal cisterns are patent.  Visualized portions of the orbits are unremarkable. The visualized portions  of the paranasal sinuses and mastoid air cells are unremarkable. Cerebrovascular atherosclerotic calcifications are noted.  The osseous structures are unremarkable.  IMPRESSION: No acute intracranial pathology.   Electronically Signed   By: Elige KoHetal  Patel   On: 09/01/2013 14:39   Dg Knee Complete 4 Views Right  09/01/2013   CLINICAL DATA:  Larey SeatFell.  Right knee pain.  EXAM: RIGHT KNEE - COMPLETE 4+ VIEW  COMPARISON:  None.  FINDINGS: Advanced tricompartmental degenerative changes with joint space narrowing, osteophytic spurring, subchondral cystic change and bony eburnation. There is also a moderate-sized joint effusion. No acute bony abnormality.  IMPRESSION: Advanced tricompartmental degenerative changes and moderate-sized joint effusion.   Electronically Signed   By: Loralie ChampagneMark  Gallerani M.D.   On: 09/01/2013 14:27    Scheduled Meds: . allopurinol  100 mg Oral Daily  . aspirin EC  81 mg Oral Daily  . carvedilol  3.125 mg Oral BID WC  . docusate sodium  100 mg Oral BID  . enoxaparin (LOVENOX) injection  30 mg Subcutaneous Q24H  . insulin aspart  0-9 Units Subcutaneous TID WC  . levothyroxine  50 mcg Oral QAC breakfast  . lubiprostone  8 mcg Oral BID  . oxybutynin  5 mg Oral BID   Continuous Infusions: . sodium chloride 75 mL/hr at 09/01/13 1633  . sodium chloride 50 mL/hr at 09/01/13 1801    Active Problems:   DIABETES MELLITUS   HYPERLIPIDEMIA   HYPONATREMIA   HYPERTENSION   CAD   Anemia   Chronic diastolic heart failure   Hyponatremia   CKD (chronic kidney disease) stage 4, GFR 15-29 ml/min    Time spent: 40 minutes   Ellenville Regional HospitalBROL,NAYANA  [facility] Pager [phone]. If 8PM-8AM, please contact night-coverage at [email or web address], password [email or web address] 09/02/2013, 9:15 AM  LOS: 1 day

## 2013-09-03 DIAGNOSIS — N289 Disorder of kidney and ureter, unspecified: Secondary | ICD-10-CM

## 2013-09-03 DIAGNOSIS — I509 Heart failure, unspecified: Secondary | ICD-10-CM

## 2013-09-03 DIAGNOSIS — E119 Type 2 diabetes mellitus without complications: Secondary | ICD-10-CM

## 2013-09-03 LAB — GLUCOSE, CAPILLARY
GLUCOSE-CAPILLARY: 88 mg/dL (ref 70–99)
GLUCOSE-CAPILLARY: 122 mg/dL — AB (ref 70–99)
Glucose-Capillary: 69 mg/dL — ABNORMAL LOW (ref 70–99)
Glucose-Capillary: 72 mg/dL (ref 70–99)

## 2013-09-03 LAB — BASIC METABOLIC PANEL
ANION GAP: 14 (ref 5–15)
BUN: 30 mg/dL — ABNORMAL HIGH (ref 6–23)
BUN: 31 mg/dL — ABNORMAL HIGH (ref 6–23)
CHLORIDE: 88 meq/L — AB (ref 96–112)
CO2: 22 mEq/L (ref 19–32)
CO2: 21 mEq/L (ref 19–32)
CREATININE: 1.95 mg/dL — AB (ref 0.50–1.10)
Calcium: 8.2 mg/dL — ABNORMAL LOW (ref 8.4–10.5)
Calcium: 8.4 mg/dL (ref 8.4–10.5)
Chloride: 87 mEq/L — ABNORMAL LOW (ref 96–112)
Creatinine, Ser: 2.07 mg/dL — ABNORMAL HIGH (ref 0.50–1.10)
GFR calc non Af Amer: 20 mL/min — ABNORMAL LOW (ref >90)
GFR calc non Af Amer: 22 mL/min — ABNORMAL LOW (ref >90)
GFR, EST AFRICAN AMERICAN: 24 mL/min — AB (ref >90)
GFR, EST AFRICAN AMERICAN: 25 mL/min — AB (ref >90)
GLUCOSE: 97 mg/dL (ref 70–99)
Glucose, Bld: 93 mg/dL (ref 70–99)
POTASSIUM: 3.5 meq/L — AB (ref 3.7–5.3)
POTASSIUM: 3.8 meq/L (ref 3.7–5.3)
SODIUM: 123 meq/L — AB (ref 137–147)
Sodium: 123 mEq/L — ABNORMAL LOW (ref 137–147)

## 2013-09-03 MED ORDER — DEXTROSE 50 % IV SOLN
50.0000 mL | Freq: Once | INTRAVENOUS | Status: AC | PRN
Start: 2013-09-03 — End: 2013-09-03

## 2013-09-03 MED ORDER — DEXTROSE 50 % IV SOLN
25.0000 mL | Freq: Once | INTRAVENOUS | Status: AC | PRN
Start: 2013-09-03 — End: 2013-09-03

## 2013-09-03 MED ORDER — SODIUM CHLORIDE 0.9 % IV BOLUS (SEPSIS)
500.0000 mL | Freq: Once | INTRAVENOUS | Status: AC
  Administered 2013-09-03: 500 mL via INTRAVENOUS

## 2013-09-03 MED ORDER — FUROSEMIDE 80 MG PO TABS
80.0000 mg | ORAL_TABLET | Freq: Two times a day (BID) | ORAL | Status: DC
  Administered 2013-09-03 – 2013-09-04 (×2): 80 mg via ORAL
  Filled 2013-09-03 (×4): qty 1

## 2013-09-03 NOTE — Evaluation (Signed)
Occupational Therapy Evaluation Patient Details Name: [name] [name] MRN: [id number] DOB: 22-Mar-1923 Today's Date: 09/03/2013    History of Present Illness 80 year old female with a history of CAD with history of MI in 1999, hypertension, diabetes mellitus type 2, CKD stage IV presents with one to two-week history of generalized weakness, increased lethargy, and mechanical falls. The patient is a poor historian with suspected underlying dementia. All of this history is obtained from speaking with the patient's son and reviewed the medical record. In the past 2 weeks, the patient's son has noted increasing lethargy and sleepiness during the daytime. In addition, the patient has had decreased oral intake. There has not been any fevers, chills, chest pain, shortness breath, vomiting, diarrhea, hematochezia, melena. There is no dysuria, hematuria, rashes, headaches. The patient has chronic left upper extremity and left lower extremity weakness. At baseline, the patient needs full assistance of her son for all her activities of daily living. Given with the help of her son in the past 3-4 days, the patient has had 2 falls. In fact, the patient fell and hit her right knee.    Clinical Impression   Pt demonstrates decline in function with ADLs and ADL mobility safety with decreased strength, balance and endurance. Pt currently requires extrnsive assist with ADLs  and transfers at this time. Pt's son is her primary caregiver at home and he stated to OT that he has hurt his back at home while assisting pt. Pt states that she does not want to go to a SNF. Recommend short term SNF for rehab as safest option at this time    Follow Up Recommendations  SNF;Supervision/Assistance - 24 hour    Equipment Recommendations  None recommended by OT;Other (comment) (TBD at next venue of care)    Recommendations for Other Services       Precautions / Restrictions Precautions Precautions: Fall Restrictions Weight  Bearing Restrictions: No      Mobility Bed Mobility Overal bed mobility: Needs Assistance Bed Mobility: Supine to Sit     Supine to sit: Max assist;HOB elevated     General bed mobility comments: Max assist with use of bed pad to clear feet from EOB, and max assist with LEs to EOB off bed a trunk for elevation  Transfers Overall transfer level: Needs assistance Equipment used: None Transfers: Stand Pivot Transfers   Stand pivot transfers: Max assist       General transfer comment: Required max A with knees blocked and tight hold of gait belt    Balance Overall balance assessment: Needs assistance Sitting-balance support: Single extremity supported;No upper extremity supported;Feet supported Sitting balance-Leahy Scale: Fair     Standing balance support: During functional activity Standing balance-Leahy Scale: Poor                              ADL Overall ADL's : Needs assistance/impaired     Grooming: Wash/dry hands;Wash/dry face;Minimal assistance;Sitting   Upper Body Bathing: Sitting;Moderate assistance   Lower Body Bathing: Total assistance   Upper Body Dressing : Sitting;Moderate assistance   Lower Body Dressing: Total assistance   Toilet Transfer: Maximal assistance;BSC;Stand-pivot Toilet Transfer Details (indicate cue type and reason): Required max A with knees blocked and tight hold of gait belt Toileting- Clothing Manipulation and Hygiene: Total assistance       Functional mobility during ADLs: Maximal assistance       Vision  wears glasses  Perception Perception Perception Tested?: No   Praxis Praxis Praxis tested?: Not tested    Pertinent Vitals/Pain No c/o pain, VSS     Hand Dominance Right   Extremity/Trunk Assessment Upper Extremity Assessment Upper Extremity Assessment: Generalized weakness;RUE deficits/detail;LUE deficits/detail RUE Deficits / Details: ROM B shoulders impaired due to OA/  hx surgery, 50 degrees grossly flexion and abduction LUE Deficits / Details: ROM B shoulders impaired due to OA/ hx surgery, 50 degrees grossly flexion and abduction   Lower Extremity Assessment Lower Extremity Assessment: Defer to PT evaluation   Cervical / Trunk Assessment Cervical / Trunk Assessment: Normal   Communication     Cognition Arousal/Alertness: Awake/alert Behavior During Therapy: Flat affect Overall Cognitive Status: History of cognitive impairments - at baseline       Memory: Decreased short-term memory             General Comments   Pt pleasant and cooperative                 Home Living Family/patient expects to be discharged to:: Private residence vs SNF Living Arrangements: Children Available Help at Discharge: Family;Available 24 hours/day Type of Home: House Home Access: Stairs to enter Enter[employer] CorporationEntrance Stairs-Number of Steps: 2-3 Entrance Stairs-Rails: Right Home Layout: One level     Bathroom Shower/Tub: Chief Strategy OfficerTub/shower unit   Bathroom Toilet: Standard     Home Equipment: Cane - single point;Walker - 4 wheels;Shower seat          Prior Functioning/Environment Level of Independence: Needs assistance    ADL's / Homemaking Assistance Needed: Pt is a poor historian and states that she is independent with bed mobillity and ADLs, however, pt's son states that he assists her with getting OOB and all ADLs and transfers   Comments: Per H&P, pt nees assist with all ADLs; Nurse Extern informed me she was able to feed herself dinner yesterday    OT Diagnosis: Generalized weakness   OT Problem List: Decreased strength;Decreased knowledge of use of DME or AE;Decreased range of motion;Decreased activity tolerance;Decreased cognition;Impaired balance (sitting and/or standing);Decreased safety awareness   OT Treatment/Interventions: Self-care/ADL training;Therapeutic exercise;Patient/family education;Neuromuscular education;Balance training;Therapeutic  activities;DME and/or AE instruction    OT Goals(Current goals can be found in the care plan section) Acute Rehab OT Goals Patient Stated Goal: go home OT Goal Formulation: With patient/family Time For Goal Achievement: 09/10/13 Potential to Achieve Goals: Good ADL Goals Pt Will Perform Grooming: with min guard assist;with supervision;with set-up;sitting Pt Will Perform Upper Body Bathing: with min assist;sitting Pt Will Perform Lower Body Bathing: with max assist;with mod assist;sitting/lateral leans Pt Will Perform Upper Body Dressing: with min assist;sitting Pt Will Transfer to Toilet: with mod assist;bedside commode  OT Frequency: Min 2X/week   Barriers to D/C:    Pt's son is primary cargiver. Uncertain if he is able to proivde current level of care. Pt's son states that he has hurt his back from assisting the pt at home                     End of Session Equipment Utilized During Treatment: Gait belt;Other (comment) (BSC)  Activity Tolerance: Patient tolerated treatment well Patient left: Other (comment);with family/visitor present;with call bell/phone within reach ([facility] on [facility] with, nurse tech aware)   Time: [id number] OT Time Calculation (min): 41 min Charges:  OT General Charges $OT Visit: 1 Procedure OT Evaluation $Initial OT Evaluation Tier I: 1 Procedure OT Treatments $Self Care/Home Management : 8-22 mins $Therapeutic Activity: 8-22  mins G-Codes:    [name], Phillipa Morden [name] 09/03/2013, 1:41 PM

## 2013-09-03 NOTE — Progress Notes (Addendum)
[facility] PROGRESS NOTE  [name] [name] ZOX:[id number] DOB: 10/26/1923 DOA: 09/01/2013 PCP: Laurena SlimmerLARK,PRESTON S, MD  Assessment/Plan  Hyponatremia, likely mixed -sodium was normal in June 2014.  Initially appeared hypovolemic so lasix held and given IVF and sodium initially improved, however, now decreasing again suggesting SIADH.  Urine osms in 200 range suggesting possible SIADH, cirrhosis or heart failure etiology.   - resume furosemide  - D/C IVF  - BMP q. 12 hours, AM labs STILL pending at 9AM. - FENa 0.8% and may have been affected by lasix -TSH is normal  - Cortisol level - ECHO demonstrates EF 55-60% with grade 1 DD, mild AV stenosis, normal MV, mildly dilated right ventricle, PA peak pressure 57mmHg   Gait instability with frequent falls  - PT evaluation:  Recommending SNF, however, patient refusing -  Home health PT/OT/RN  Hypertension, intermittent hypotension -  D/c carvedilol  Chronic diastolic heart failure/R-sided CHF, appears euvolemic except for RLE swelling -  D/c IVF  Diabetes mellitus type 2, A1c 5.5.  Okay to stop medications and monitor A1c by PCP.  Hypoglycemic -  Hypoglycemic protocol -  D/c insulin -  Diet control only at time of discharge.  Hypothyroidism  TSH normal  -Continue Synthroid   Anemia of CKD  -Patient is at her baseline, 9-10  -Continue to monitor   CKD stage IV  -Baseline creatinine 2.4-2.6   Right knee pain/effusion X ray shows Advanced tricompartmental degenerative changes and moderate-sized  joint effusion. -Likely due to the patient's recent fall hitting her right knee  -conservative management - consider orthopedic consultation for aspiration if interfering with PT - duplex RLE   Diet:  diabetic Access:  PIV IVF:  off Proph:  lovenox  Code Status: full Family Communication: patient alone.  Attempted to call son and grandson at all numbers listed, but no answer. Disposition Plan: pending improvement in sodium  level.  Patient declining SNF for rehab, but I would like to discuss with son and grandson.     Consultants:  None  Procedures:  CT head  CXR  XR knee right  Antibiotics:  none   HPI/Subjective:  States she feels well.  Does not remember working with PT yesterday.  Denies lightheadedness, dizziness.    Objective: Filed Vitals:   09/02/13 0953 09/02/13 2132 09/03/13 0459 09/03/13 0725  BP: 106/67 109/46 93/48 132/56  Pulse: 54 53 58 61  Temp: 97.4 F (36.3 C) 97.8 F (36.6 C) 98.8 F (37.1 C)   TempSrc: Oral Oral Oral   Resp: 20  17   Height:      Weight:  84.1 kg (185 lb 6.5 oz)    SpO2: 92% 100% 99%     Intake/Output Summary (Last 24 hours) at 09/03/13 0852 Last data filed at 09/03/13 0700  Gross per 24 hour  Intake 2883.75 ml  Output      0 ml  Net 2883.75 ml   Filed Weights   09/01/13 2027 09/02/13 2132  Weight: 80.7 kg (177 lb 14.6 oz) 84.1 kg (185 lb 6.5 oz)    Exam:   General:  BF, No acute distress  HEENT:  NCAT, MMM  Cardiovascular:  RRR, nl S1, S2 no mrg, 2+ pulses, warm extremities  Respiratory:  CTAB, no increased WOB  Abdomen:   NABS, soft, NT/ND  MSK:   Normal tone and bulk, 2+ RLE pitting edema with joint effusion  Neuro:  Diffusely weak.   Psych:  A&O to person, place, but not reason  for admission.  Year 16-20, unable to name month ("October, November, December.")  Data Reviewed: Basic Metabolic Panel:  Recent Labs Lab 09/01/13 1357 09/01/13 2205 09/02/13 0204 09/02/13 1033 09/02/13 1634  NA 118* 123* 122* 124* 121*  K 3.6* 4.0 3.5* 3.5* 3.8  CL 82* 83* 84* 86* 86*  CO2 21 21 22 21  18*  GLUCOSE 130* 80 101* 64* 104*  BUN 33* 33* 33* 32* 32*  CREATININE 2.46* 2.34* 2.29* 2.23* 2.14*  CALCIUM 8.5 8.3* 8.1* 8.3* 8.0*   Liver Function Tests:  Recent Labs Lab 09/01/13 1357  AST 16  ALT 7  ALKPHOS 68  BILITOT 0.7  PROT 6.5  ALBUMIN 3.4*   No results found for this basename: LIPASE, AMYLASE,  in the last 168  hours No results found for this basename: AMMONIA,  in the last 168 hours CBC:  Recent Labs Lab 09/01/13 1357 09/02/13 1034  WBC 11.0* 10.3  HGB 10.7* 10.1*  HCT 30.2* 28.9*  MCV 84.4 86.0  PLT 205 188   Cardiac Enzymes:  Recent Labs Lab 09/01/13 1357  TROPONINI <0.30   BNP (last 3 results)  Recent Labs  09/02/13 1015  PROBNP 3987.0*   CBG:  Recent Labs Lab 09/02/13 1153 09/02/13 2135 09/02/13 2209 09/03/13 0743 09/03/13 0829  GLUCAP 71 66* 81 69* 72    Recent Results (from the past 240 hour(s))  URINE CULTURE     Status: None   Collection Time    08/26/13  7:03 PM      Result Value Ref Range Status   Specimen Description URINE, RANDOM   Final   Special Requests NONE   Final   Culture  Setup Time     Final   Value: 08/27/2013 01:26     [facility]   Colony Count     Final   Value: NO GROWTH     [facility]   Culture     Final   Value: NO GROWTH     Performed at [facility]   Report Status 08/27/2013 FINAL   Final  URINE CULTURE     Status: None   Collection Time    09/01/13  1:42 PM      Result Value Ref Range Status   Specimen Description URINE, CATHETERIZED   Final   Special Requests NONE   Final   Culture  Setup Time     Final   Value: 09/01/2013 17:30     Performed at [facility]   Colony Count     Final   Value: NO GROWTH     [facility]formed at [facility]   Culture     Final   Value: NO GROWTH     Performed at [facility]   Report Status 09/02/2013 FINAL   Final     Studies: Dg Chest 2 View  09/01/2013   CLINICAL DATA:  Status post fall.  Altered mental status.  EXAM: CHEST  2 VIEW  COMPARISON:  Single view of the chest 08/24/2012.  FINDINGS: The patient is rotated on the study. Lungs are clear. There is cardiomegaly. No pneumothorax or pleural effusion. Right shoulder replacement is noted. The left humeral has been removed.  IMPRESSION: No acute abnormality.    Electronically Signed   By: Drusilla Kannerhomas  Dalessio M.D.   On: 09/01/2013 14:27   Ct Head Wo Contrast  09/01/2013   CLINICAL DATA:  Fall, altered mental status  EXAM:  CT HEAD WITHOUT CONTRAST  TECHNIQUE: Contiguous axial images were obtained from the base of the skull through the vertex without intravenous contrast.  COMPARISON:  05/17/2007  FINDINGS: There is no evidence of mass effect, midline shift, or extra-axial fluid collections. There is no evidence of a space-occupying lesion or intracranial hemorrhage. There is no evidence of a cortical-based area of acute infarction. There is generalized cerebral atrophy. There is periventricular white matter low attenuation likely secondary to microangiopathy.  The ventricles and sulci are appropriate for the patient's age. The basal cisterns are patent.  Visualized portions of the orbits are unremarkable. The visualized portions of the paranasal sinuses and mastoid air cells are unremarkable. Cerebrovascular atherosclerotic calcifications are noted.  The osseous structures are unremarkable.  IMPRESSION: No acute intracranial pathology.   Electronically Signed   By: Elige Ko   On: 09/01/2013 14:39   Dg Knee Complete 4 Views Right  09/01/2013   CLINICAL DATA:  Larey Seat.  Right knee pain.  EXAM: RIGHT KNEE - COMPLETE 4+ VIEW  COMPARISON:  None.  FINDINGS: Advanced tricompartmental degenerative changes with joint space narrowing, osteophytic spurring, subchondral cystic change and bony eburnation. There is also a moderate-sized joint effusion. No acute bony abnormality.  IMPRESSION: Advanced tricompartmental degenerative changes and moderate-sized joint effusion.   Electronically Signed   By: Loralie Champagne M.D.   On: 09/01/2013 14:27    Scheduled Meds: . allopurinol  100 mg Oral Daily  . aspirin EC  81 mg Oral Daily  . carvedilol  3.125 mg Oral BID WC  . docusate sodium  100 mg Oral BID  . enoxaparin (LOVENOX) injection  30 mg Subcutaneous Q24H  . levothyroxine  50  mcg Oral QAC breakfast  . lubiprostone  8 mcg Oral BID  . oxybutynin  5 mg Oral BID   Continuous Infusions: . sodium chloride 75 mL/hr at 09/03/13 1610    Active Problems:   DIABETES MELLITUS   HYPERLIPIDEMIA   HYPONATREMIA   HYPERTENSION   CAD   Anemia   Chronic diastolic heart failure   Hyponatremia   CKD (chronic kidney disease) stage 4, GFR 15-29 ml/min    Time spent: 30 min    Elmar Antigua  [facility] Pager [phone]. If 7PM-7AM, please contact night-coverage at [email or web address], password [facility] 09/03/2013, 8:52 AM  LOS: 2 days

## 2013-09-03 NOTE — Plan of Care (Signed)
Problem: Phase I Progression Outcomes Goal: OOB as tolerated unless otherwise ordered Outcome: Progressing PT consulted.     

## 2013-09-04 ENCOUNTER — Inpatient Hospital Stay (HOSPITAL_COMMUNITY): Payer: Medicare Other

## 2013-09-04 DIAGNOSIS — M7989 Other specified soft tissue disorders: Secondary | ICD-10-CM

## 2013-09-04 DIAGNOSIS — R609 Edema, unspecified: Secondary | ICD-10-CM

## 2013-09-04 LAB — GLUCOSE, CAPILLARY
GLUCOSE-CAPILLARY: 90 mg/dL (ref 70–99)
GLUCOSE-CAPILLARY: 101 mg/dL — AB (ref 70–99)
GLUCOSE-CAPILLARY: 97 mg/dL (ref 70–99)
Glucose-Capillary: 92 mg/dL (ref 70–99)
Glucose-Capillary: 101 mg/dL — ABNORMAL HIGH (ref 70–99)

## 2013-09-04 LAB — CORTISOL-AM, BLOOD: CORTISOL - AM: 14.9 ug/dL (ref 4.3–22.4)

## 2013-09-04 LAB — OSMOLALITY: Osmolality: 269 mOsm/kg — ABNORMAL LOW (ref 275–300)

## 2013-09-04 LAB — OSMOLALITY, URINE: Osmolality, Ur: 140 mOsm/kg — ABNORMAL LOW (ref 390–1090)

## 2013-09-04 MED ORDER — ENSURE COMPLETE PO LIQD
237.0000 mL | Freq: Two times a day (BID) | ORAL | Status: DC
  Administered 2013-09-04 (×2): 237 mL via ORAL

## 2013-09-04 MED ORDER — SODIUM CHLORIDE 0.9 % IV SOLN
INTRAVENOUS | Status: DC
Start: 2013-09-04 — End: 2013-09-05
  Administered 2013-09-04 (×2): via INTRAVENOUS

## 2013-09-04 NOTE — Progress Notes (Signed)
Physical Therapy Treatment Patient Details Name: [name] [name] MRN: [id number] DOB: 11/19/1923 Today's Date: 09/04/2013    History of Present Illness  Pt. was admitted on 09/01/13 for falls and lethargy. Current porblem list includes hyponatremia, gait instability/falls, chronic diastolic heart failure with right sided CHF, anemia of CKD, R knee pain effusion from falling onto knees.      PT Comments    Pt. With dependence in transfers to 3n1 and to recliner at max of one and two assist level.  Pt would benefit from rehabilitation in the SNF setting but chart indicating family is declining.    Follow Up Recommendations  SNF;Supervision/Assistance - 24 hour     Equipment Recommendations       Recommendations for Other Services       Precautions / Restrictions Precautions Precautions: Fall Restrictions Weight Bearing Restrictions: No    Mobility  Bed Mobility Overal bed mobility: Needs Assistance Bed Mobility: Supine to Sit     Supine to sit: Max assist;HOB elevated     General bed mobility comments: max assist and use of bed pad to come to EOB and get feet positioned on floor  Transfers Overall transfer level: Needs assistance Equipment used: None Transfers: Stand Pivot Transfers   Stand pivot transfers: Max assist       General transfer comment: Pt. needed max assist to transfer from bed to 3n1.  Pt. unable to shift self backward to square up on tolilet and needed external assist for this.  Pt. then transitioned to recliner with 2 max assist    Ambulation/Gait Ambulation/Gait assistance:  (pt. unable)               Stairs            Wheelchair Mobility    Modified Rankin (Stroke Patients Only)       Balance                                    Cognition Arousal/Alertness: Awake/alert Behavior During Therapy: WFL for tasks assessed/performed Overall Cognitive Status: History of cognitive impairments - at baseline        Memory: Decreased short-term memory              Exercises      General Comments        Pertinent Vitals/Pain See vitals tab Limitations in function do not appear to be driven as much by pain as by deconditioning and generalized weakness.    Home Living                      Prior Function            PT Goals (current goals can now be found in the care plan section) Progress towards PT goals: Progressing toward goals    Frequency  Min 3X/week    PT Plan Current plan remains appropriate    Co-evaluation             End of Session Equipment Utilized During Treatment: Gait belt Activity Tolerance: Patient limited by fatigue Patient left: in chair;with call bell/phone within reach;with chair alarm set;with nursing/sitter in room     Time: 1055-1120 PT Time Calculation (min): 25 min  Charges:  $Therapeutic Activity: 23-37 mins                     G Codes:  Ferman HammingBlankenship, Taren Toops B 09/04/2013, 11:59 AM Weldon PickingSusan Jisselle Poth PT Acute Rehab Services [phone] Bee[place] [phone]

## 2013-09-04 NOTE — Consult Note (Signed)
Reason for Consult:Hyponatremia Referring Physician: Dr. Sheran Fava  HPI: [name] [name] is a 80 y.o. female with a PMH significant for CAD s/p PCI (1999), HTN, Controlled DMII, CKD stage IV, and grade 1 diastolic dysfunction who p/w generalized weakness, lethargy, and mechanical falls for the past 2 weeks. Pt has decreased po intake.  No fevers, chills, chest pain, shortness breath, vomiting, diarrhea, hematochezia, melena.  No orthopnea, LE edema or increased abdominal girth. Last saw her PCP 3 months ago.  In ED, sodium of 118, serum creatinine 2.46.  Normal LFTs, with mild leukocytosis.  UA unremarkable.  Neg CT head.  CXR neg Initially, she was given NS but was discontinued because it appeared sodium was trending down.  Today, she is disoriented and states she doesn't know where she is and that she works at [employer].  No family is present in the room. She denies any CP, SOB, N/V/D/C.  She is unsure why she was brought here but wants to go home.   PMH:   Past Medical History  Diagnosis Date  . CAD (coronary artery disease)   . Gout   . Hyperlipemia   . Hypertension   . Diabetes mellitus without complication   . MI (myocardial infarction) August 1999    PCI RCA  . Anemia   . Diastolic heart failure   . Chronic kidney disease, stage IV (severe)   . Shortness of breath   . Arthritis     KNEES & BACK     PSH:   Past Surgical History  Procedure Laterality Date  . Abdominal surgery    . Replacement total knee Left   . Total shoulder replacement Right   . Appendectomy    . Cholecystectomy    . Cardiac catheterization  1999    LAD OK, OM2 70%, RCA 90/80% with spont dissection, S/P PCI RCA    Allergies:  Allergies  Allergen Reactions  . Prinivil [Lisinopril]     Pt reports unaware of allergy    Medications:   Prior to Admission medications   Medication Sig Start Date End Date Taking? Authorizing Provider  allopurinol (ZYLOPRIM) 100 MG tablet Take 100 mg by mouth daily.    Yes Historical Provider, MD  aspirin EC 81 MG tablet Take 81 mg by mouth daily.   Yes Historical Provider, MD  carvedilol (COREG) 6.25 MG tablet Take 1 tablet (6.25 mg total) by mouth 2 (two) times daily with a meal. 08/27/12  Yes Thurnell Lose, MD  furosemide (LASIX) 80 MG tablet Take 80 mg by mouth 2 (two) times daily.   Yes Historical Provider, MD  glipiZIDE (GLUCOTROL XL) 2.5 MG 24 hr tablet Take 2.5 mg by mouth daily.   Yes Historical Provider, MD  glucose 4 GM chewable tablet Chew 16 g by mouth as needed for low blood sugar.   Yes Historical Provider, MD  levothyroxine (SYNTHROID, LEVOTHROID) 50 MCG tablet Take 50 mcg by mouth daily before breakfast.   Yes Historical Provider, MD  lubiprostone (AMITIZA) 8 MCG capsule Take 8 mcg by mouth 2 (two) times daily.    Yes Historical Provider, MD  nystatin-triamcinolone (MYCOLOG II) cream Apply 1 application topically daily as needed (antifungal).    Yes Historical Provider, MD  oxybutynin (DITROPAN) 5 MG tablet Take 5 mg by mouth 2 (two) times daily.   Yes Historical Provider, MD    Discontinued Meds:   Medications Discontinued During This Encounter  Medication Reason  . furosemide (LASIX) 80 MG tablet Entry  Error  . 0.9 %  sodium chloride infusion   . insulin aspart (novoLOG) injection 0-9 Units   . 0.9 %  sodium chloride infusion   . carvedilol (COREG) tablet 3.125 mg   . furosemide (LASIX) tablet 80 mg     Social History:  reports that she has quit smoking. She has never used smokeless tobacco. She reports that she does not drink alcohol or use illicit drugs.  Family History:   Family History  Problem Relation Age of Onset  . Arthritis Mother     Died at age 51  . Arthritis Sister    ROS: Pertinent review of systems noted in HPI. Pt very confused, but is able to tell us that her legs "are usually a lot more swollen than they are now", that she wears a pad or a diaper because "her urine runs down her leg" and that she "doesn't think  she drinks enough water" (but can't say how much) Denies at present shortness of breath or pain but feels need to void.   Creatinine, Ser  Date/Time Value Ref Range Status  09/03/2013  9:52 PM 1.95* 0.50 - 1.10 mg/dL Final  09/03/2013  1:47 PM 2.07* 0.50 - 1.10 mg/dL Final  09/02/2013  4:34 PM 2.14* 0.50 - 1.10 mg/dL Final  09/02/2013 10:33 AM 2.23* 0.50 - 1.10 mg/dL Final  09/02/2013  2:04 AM 2.29* 0.50 - 1.10 mg/dL Final  09/01/2013 10:05 PM 2.34* 0.50 - 1.10 mg/dL Final  09/01/2013  1:57 PM 2.46* 0.50 - 1.10 mg/dL Final  08/26/2013  4:52 PM 2.60* 0.50 - 1.10 mg/dL Final  08/27/2012  5:00 AM 3.02* 0.50 - 1.10 mg/dL Final  08/26/2012  5:20 AM 3.36* 0.50 - 1.10 mg/dL Final  08/25/2012  4:00 AM 3.45* 0.50 - 1.10 mg/dL Final  08/24/2012  4:40 PM 3.59* 0.50 - 1.10 mg/dL Final  05/27/2009  3:40 AM 3.07* 0.4 - 1.2 mg/dL Final  05/26/2009  4:20 AM 2.88* 0.4 - 1.2 mg/dL Final  05/25/2009  4:18 AM 2.92* 0.4 - 1.2 mg/dL Final  05/24/2009 12:12 PM 3.1* 0.4 - 1.2 mg/dL Final  07/31/2008  9:34 AM 2.4* 0.4-1.2 mg/dL Final  05/17/2007 12:32 PM 2.1*  Final  01/08/2007  4:23 AM 1.73*  Final  01/05/2007 10:35 AM 2.32*  Final  01/04/2007  4:35 AM 2.06*  Final  01/04/2007  4:35 AM 2.09*  Final  01/03/2007  5:15 AM 2.03*  Final  01/02/2007  4:20 AM 1.78*  Final  01/01/2007 10:45 AM 1.86*  Final  11/30/2006  1:05 PM 2.00*  Final    Recent Labs Lab 09/01/13 1357 09/01/13 2205 09/02/13 0204 09/02/13 1033 09/02/13 1634 09/03/13 1347 09/03/13 2152  NA 118* 123* 122* 124* 121* 123* 123*  K 3.6* 4.0 3.5* 3.5* 3.8 3.5* 3.8  CL 82* 83* 84* 86* 86* 87* 88*  CO2 '21 21 22 21 ' 18* 22 21  GLUCOSE 130* 80 101* 64* 104* 97 93  BUN 33* 33* 33* 32* 32* 30* 31*  CREATININE 2.46* 2.34* 2.29* 2.23* 2.14* 2.07* 1.95*  CALCIUM 8.5 8.3* 8.1* 8.3* 8.0* 8.2* 8.4    Recent Labs Lab 09/01/13 1357 09/02/13 1034  WBC 11.0* 10.3  HGB 10.7* 10.1*  HCT 30.2* 28.9*  MCV 84.4 86.0  PLT 205 188   Liver Function Tests:  Recent  Labs Lab 09/01/13 1357  AST 16  ALT 7  ALKPHOS 68  BILITOT 0.7  PROT 6.5  ALBUMIN 3.4*   No results found for this basename:  LIPASE, AMYLASE,  in the last 168 hours No results found for this basename: AMMONIA,  in the last 168 hours Cardiac Enzymes:  Recent Labs Lab 09/01/13 1357  TROPONINI <0.30   Iron Studies: No results found for this basename: IRON, TIBC, TRANSFERRIN, FERRITIN,  in the last 72 hours  Results for orders placed during the hospital encounter of 09/01/13 (from the past 48 hour(s))  BASIC METABOLIC PANEL     Status: Abnormal   Collection Time    09/02/13  4:34 PM      Result Value Ref Range   Sodium 121 (*) 137 - 147 mEq/L   Comment: CRITICAL RESULT CALLED TO, READ BACK BY AND VERIFIED WITH:     A LAWSON,RN 1736 09/02/13 WBOND   Potassium 3.8  3.7 - 5.3 mEq/L   Chloride 86 (*) 96 - 112 mEq/L   CO2 18 (*) 19 - 32 mEq/L   Glucose, Bld 104 (*) 70 - 99 mg/dL   BUN 32 (*) 6 - 23 mg/dL   Creatinine, Ser 2.14 (*) 0.50 - 1.10 mg/dL   Calcium 8.0 (*) 8.4 - 10.5 mg/dL   GFR calc non Af Amer 20 (*) >90 mL/min   GFR calc Af Amer 23 (*) >90 mL/min   Comment: (NOTE)     The eGFR has been calculated using the CKD EPI equation.     This calculation has not been validated in all clinical situations.     eGFR's persistently <90 mL/min signify possible Chronic Kidney     Disease.  GLUCOSE, CAPILLARY     Status: Abnormal   Collection Time    09/02/13  9:35 PM      Result Value Ref Range   Glucose-Capillary 66 (*) 70 - 99 mg/dL  GLUCOSE, CAPILLARY     Status: None   Collection Time    09/02/13 10:09 PM      Result Value Ref Range   Glucose-Capillary 81  70 - 99 mg/dL  GLUCOSE, CAPILLARY     Status: Abnormal   Collection Time    09/03/13  7:43 AM      Result Value Ref Range   Glucose-Capillary 69 (*) 70 - 99 mg/dL  GLUCOSE, CAPILLARY     Status: None   Collection Time    09/03/13  8:29 AM      Result Value Ref Range   Glucose-Capillary 72  70 - 99 mg/dL   CORTISOL-AM, BLOOD     Status: None   Collection Time    09/03/13  8:50 AM      Result Value Ref Range   Cortisol - AM 14.9  4.3 - 22.4 ug/dL   Comment: Performed at [place], CAPILLARY     Status: None   Collection Time    09/03/13 11:26 AM      Result Value Ref Range   Glucose-Capillary 88  70 - 99 mg/dL  BASIC METABOLIC PANEL     Status: Abnormal   Collection Time    09/03/13  1:47 PM      Result Value Ref Range   Sodium 123 (*) 137 - 147 mEq/L   Potassium 3.5 (*) 3.7 - 5.3 mEq/L   Chloride 87 (*) 96 - 112 mEq/L   CO2 22  19 - 32 mEq/L   Glucose, Bld 97  70 - 99 mg/dL   BUN 30 (*) 6 - 23 mg/dL   Creatinine, Ser 2.07 (*) 0.50 - 1.10 mg/dL   Calcium 8.2 (*)  8.4 - 10.5 mg/dL   GFR calc non Af Amer 20 (*) >90 mL/min   GFR calc Af Amer 24 (*) >90 mL/min   Comment: (NOTE)     The eGFR has been calculated using the CKD EPI equation.     This calculation has not been validated in all clinical situations.     eGFR's persistently <90 mL/min signify possible Chronic Kidney     Disease.  GLUCOSE, CAPILLARY     Status: Abnormal   Collection Time    09/03/13  4:23 PM      Result Value Ref Range   Glucose-Capillary 122 (*) 70 - 99 mg/dL  BASIC METABOLIC PANEL     Status: Abnormal   Collection Time    09/03/13  9:52 PM      Result Value Ref Range   Sodium 123 (*) 137 - 147 mEq/L   Potassium 3.8  3.7 - 5.3 mEq/L   Chloride 88 (*) 96 - 112 mEq/L   CO2 21  19 - 32 mEq/L   Glucose, Bld 93  70 - 99 mg/dL   BUN 31 (*) 6 - 23 mg/dL   Creatinine, Ser 1.95 (*) 0.50 - 1.10 mg/dL   Calcium 8.4  8.4 - 10.5 mg/dL   GFR calc non Af Amer 22 (*) >90 mL/min   GFR calc Af Amer 25 (*) >90 mL/min   Comment: (NOTE)     The eGFR has been calculated using the CKD EPI equation.     This calculation has not been validated in all clinical situations.     eGFR's persistently <90 mL/min signify possible Chronic Kidney     Disease.   Anion gap 14  5 - 15  GLUCOSE, CAPILLARY      Status: None   Collection Time    09/03/13 10:04 PM      Result Value Ref Range   Glucose-Capillary 92  70 - 99 mg/dL  GLUCOSE, CAPILLARY     Status: None   Collection Time    09/04/13  7:37 AM      Result Value Ref Range   Glucose-Capillary 90  70 - 99 mg/dL  GLUCOSE, CAPILLARY     Status: Abnormal   Collection Time    09/04/13 12:01 PM      Result Value Ref Range   Glucose-Capillary 101 (*) 70 - 99 mg/dL    No results found.   Blood pressure 126/45, pulse 64, temperature 98.4 F (36.9 C), temperature source Oral, resp. rate 17, height '5\' 5"'  (1.651 m), weight 188 lb 0.8 oz (85.3 kg), SpO2 100.00%. Constitutional: Vital signs reviewed.  Patient is a well-developed and well-nourished female in no acute distress alert but disoriented. Head: Normocephalic and atraumatic Eyes: PERRL, EOMI, conjunctivae normal, no scleral icterus.  Neck: Supple, Trachea midline . Neck veins not distended, fill appropriately with supine position Cardiovascular: RRR, no MRG, pulses symmetric and intact bilaterally Pulmonary/Chest: normal respiratory effort, CTAB, no wheezes, rales, or rhonchi or crackles Abdominal: Obese, soft; non-tender, non-distended, bs normal, no masses, organomegaly, or guarding present.  Musculoskeletal: R knee with effusion, no erythema or warmth Neurological: Alert but not-oriented to person or place, cranial nerve II-XII are grossly intact, no focal motor deficit noted Skin: Warm, dry and intact, poor skin turgor, delayed cap refill  Extremities: RLE pitting edema, assymetric 2-3+ LLE no pretibial edema but + pedal edema and wrinkling of the skin over her left foot  Assessment/Plan: 1. Chronic mild tendency toward hyponatremia with subacute  drop in Na over the past 1-2 weeks.- likely due to volume depletion in the setting of decreased po intake and continuing home lasix. ,TSH wnl.  Cortisol normal   LFTs wnl.  Grade 1 diastolic dysfunction with EF 55-60%.  Unfortunately, her UOP  has not been recorded, and weights seem inaccurate.  Scr baseline unknown (on admission 2.6-->1.95 today) but has been trending down; was initially on NS but d/c due to development of RLE edema; would check orthostatics and d/c lasix and resume IVF. Doubt MS related to hyponatremia as was experiencing same CNS sx when Na was as high as 127. 2. Acute encephalopathy- likely related to problem #1, but may also have some underlying dementia; would d/c oxybutynin in the setting of dementia and increased  3. Controlled DM- last HA1c 5.5  4. CKD4- Scr has trended down; continue to monitor;  5. Recurrent falls- likely related to problem #1 and use of oxybutynin, OT recommending SNF 6. Diastolic dysfunction- BNP actually down from 1 yr ago in the setting of CKD  7. CAD- continue current meds   Jones Bales, MD PGY-2, Internal Medicine Teaching Service 09/04/2013, 1:43 PM  I have seen and examined this patient and agree with the above assessment with highlighted additions.  [name] [name] with baseline CKD4 (for several years - details of workup not known - no ultrasound or other studies on the chart), DM and longstanding hypertension. Also on ditropan for unclear reasons, but history suggests urinary incontinence.   As far back as 2011 on review of [facility] records has had a tendency toward hyponatremia (130's). She was in the ED 1 week PTA (6/23) with somnolence and fatigue, with what was felt to be dehydration (eating and drinking poorly in a house with no AC). Serum sodium at that time was 127.  She was brought back 6/29 by family because of falls, AMS and sodium was noted to be 118.  Serum osm was low. No urine studies done.  Working dx was volume depletion and NS was given, with some improvement in her sodium, but development of asymmetric RLE edema, so fluids stopped and outpt lasix dose restarted.  (Duplex neg for DVT).  Sodium "stalled" at 123 and we were asked to see.  Exam is pertinent for marked confusion  (for situation primarily), soft blood pressures, no JVD, clear lungs, and some assymetric LE edema. TSH and cortisol are both normal.     The etiology for her rather significant hyponatremia is not totally clear to me, and with baseline CKD4 interpretation of urine studies (to try to get at [facility] issue) is difficult (due to decreased ability to either concentrate or dilute urine with a GFR of 20).  Given [name] status could have a baseline reset osmostat, and based on exam and blood pressures could still have some intravascular depletion.  I would favor discontinuation of lasix for right now, resumption of slow NS at 50/hour, and obtaining simultaneous serum and urine osm (the urine osm will be useful if high).  In addition, would scan her bladder to r/o urinary retention, place foley if large post void, and ultrasound kidneys since CKD never worked up.  Continue to follow serial sodiums and physical exam.   [name] Brearley B,MD 09/04/2013 4:05 PM

## 2013-09-04 NOTE — Progress Notes (Addendum)
[facility] PROGRESS NOTE  [name] [name] ZOX:[id number] DOB: 11/14/1923 DOA: 09/01/2013 PCP: Laurena SlimmerLARK,PRESTON S, MD  Assessment/Plan  Hyponatremia, likely mixed -sodium was normal in June 2014.  Initially appeared hypovolemic so lasix held and given IVF and sodium initially improved, however, sodium decreased again while on IVF suggesting SIADH.  Urine osms in 200 range suggesting possible SIADH, cirrhosis or heart failure etiology.  No risk factors for cirrhosis. - continue furosemide  - D/C IVF  - BMP q. 12 hours, AM labs STILL pending at 9AM. - FENa 0.8% and may have been affected by lasix - TSH wnl - Cortisol level 14.9 - ECHO demonstrates EF 55-60% with grade 1 DD, mild AV stenosis, normal MV, mildly dilated right ventricle, PA peak pressure 57mmHg  - Nephrology consultation - Telemetry:  PVC, sinus rhythm  Gait instability with frequent falls  - PT evaluation:  Recommending SNF, patient refusing but family encouraging - SW to talk to fam about [facility]  Hypertension, intermittent hypotension resolved after stopping carvedilol  Chronic diastolic heart failure/R-sided CHF, appears euvolemic except for RLE swelling -  Lasix resumed  Diabetes mellitus type 2, A1c 5.5.  Okay to stop medications and monitor A1c by PCP.   -  Diet control only at time of discharge.  Hypothyroidism  TSH normal  -Continue Synthroid   Anemia of CKD  -Patient is at her baseline, 9-10  -Continue to monitor   CKD stage IV  -Baseline creatinine 2.4-2.6, currently 1.95  Right knee pain/effusion X ray shows Advanced tricompartmental degenerative changes and moderate-sized  joint effusion. -Likely due to the patient's recent fall hitting her right knee  -conservative management - consider orthopedic consultation for aspiration if interfering with PT - duplex RLE pending  Diet:  diabetic Access:  PIV IVF:  off Proph:  lovenox  Code Status: full Family Communication: patient  alone.  Attempted to call son and grandson at all numbers listed, but no answer. Disposition Plan: pending improvement in sodium level.  Patient declining SNF for rehab, but I would like to discuss with son and grandson.     Consultants:  None  Procedures:  CT head  CXR  XR knee right  Antibiotics:  none   HPI/Subjective:  States she feels well.   Objective: Filed Vitals:   09/03/13 1000 09/03/13 1742 09/03/13 2205 09/04/13 0504  BP: 128/50 101/55 126/51 126/45  Pulse: 58 60 59 64  Temp: 98.4 F (36.9 C) 98 F (36.7 C) 97.7 F (36.5 C) 98.4 F (36.9 C)  TempSrc: Oral Oral Oral Oral  Resp: 18 18 17 17   Height:      Weight:   85.3 kg (188 lb 0.8 oz)   SpO2: 98% 96% 100% 100%    Intake/Output Summary (Last 24 hours) at 09/04/13 0930 Last data filed at 09/03/13 1700  Gross per 24 hour  Intake    180 ml  Output      0 ml  Net    180 ml   Filed Weights   09/01/13 2027 09/02/13 2132 09/03/13 2205  Weight: 80.7 kg (177 lb 14.6 oz) 84.1 kg (185 lb 6.5 oz) 85.3 kg (188 lb 0.8 oz)    Exam:   General:  BF, No acute distress  HEENT:  NCAT, MMM  Cardiovascular:  RRR, nl S1, S2 no mrg, 2+ pulses, warm extremities  Respiratory:  Faint rales at bilateral bases, no wheezes or rhonchi, no increased WOB  Abdomen:   NABS, soft, NT/ND  MSK:  Normal tone and bulk, 2+ RLE pitting edema with joint effusion, no LLE edema  Neuro:  Diffusely weak.   Psych:  A&O to person, place, but not reason for admission, month or date.  Perseverates on going home.   Data Reviewed: Basic Metabolic Panel:  Recent Labs Lab 09/02/13 0204 09/02/13 1033 09/02/13 1634 09/03/13 1347 09/03/13 2152  NA 122* 124* 121* 123* 123*  K 3.5* 3.5* 3.8 3.5* 3.8  CL 84* 86* 86* 87* 88*  CO2 22 21 18* 22 21  GLUCOSE 101* 64* 104* 97 93  BUN 33* 32* 32* 30* 31*  CREATININE 2.29* 2.23* 2.14* 2.07* 1.95*  CALCIUM 8.1* 8.3* 8.0* 8.2* 8.4   Liver Function Tests:  Recent Labs Lab  09/01/13 1357  AST 16  ALT 7  ALKPHOS 68  BILITOT 0.7  PROT 6.5  ALBUMIN 3.4*   No results found for this basename: LIPASE, AMYLASE,  in the last 168 hours No results found for this basename: AMMONIA,  in the last 168 hours CBC:  Recent Labs Lab 09/01/13 1357 09/02/13 1034  WBC 11.0* 10.3  HGB 10.7* 10.1*  HCT 30.2* 28.9*  MCV 84.4 86.0  PLT 205 188   Cardiac Enzymes:  Recent Labs Lab 09/01/13 1357  TROPONINI <0.30   BNP (last 3 results)  Recent Labs  09/02/13 1015  PROBNP 3987.0*   CBG:  Recent Labs Lab 09/03/13 0829 09/03/13 1126 09/03/13 1623 09/03/13 2204 09/04/13 0737  GLUCAP 72 88 122* 92 90    Recent Results (from the past 240 hour(s))  URINE CULTURE     Status: None   Collection Time    08/26/13  7:03 PM      Result Value Ref Range Status   Specimen Description URINE, RANDOM   Final   Special Requests NONE   Final   Culture  Setup Time     Final   Value: 08/27/2013 01:26     [facility]   Colony Count     Final   Value: NO GROWTH     [facility]   Culture     Final   Value: NO GROWTH     Performed at [facility]   Report Status 08/27/2013 FINAL   Final  URINE CULTURE     Status: None   Collection Time    09/01/13  1:42 PM      Result Value Ref Range Status   Specimen Description URINE, CATHETERIZED   Final   Special Requests NONE   Final   Culture  Setup Time     Final   Value: 09/01/2013 17:30     Performed at [facility]   Colony Count     Final   Value: NO GROWTH     [facility]formed at [facility]   Culture     Final   Value: NO GROWTH     Performed at [facility]   Report Status 09/02/2013 FINAL   Final     Studies: No results found.  Scheduled Meds: . allopurinol  100 mg Oral Daily  . aspirin EC  81 mg Oral Daily  . docusate sodium  100 mg Oral BID  . enoxaparin (LOVENOX) injection  30 mg Subcutaneous Q24H  . furosemide  80 mg Oral BID  .  levothyroxine  50 mcg Oral QAC breakfast  . lubiprostone  8 mcg Oral BID  . oxybutynin  5 mg Oral BID  Continuous Infusions:    Active Problems:   DIABETES MELLITUS   HYPERLIPIDEMIA   HYPONATREMIA   HYPERTENSION   CAD   Anemia   Chronic diastolic heart failure   Hyponatremia   CKD (chronic kidney disease) stage 4, GFR 15-29 ml/min    Time spent: 30 min    Shaunak Kreis  [facility] Pager [phone]. If 7PM-7AM, please contact night-coverage at [email or web address], password [facility] 09/04/2013, 9:30 AM  LOS: 3 days

## 2013-09-04 NOTE — Progress Notes (Signed)
INITIAL NUTRITION ASSESSMENT  DOCUMENTATION CODES Per approved criteria  -Obesity Unspecified   INTERVENTION: Please provide full assistance with all meals Add Ensure Complete po BID, each supplement provides 350 kcal and 13 grams of protein RD to continue to follow nutrition care plan  NUTRITION DIAGNOSIS: Inadequate oral intake related to poor appetite as evidenced by meal intake <50%.   Goal: Intake to meet >90% of estimated nutrition needs.  Monitor:  weight trends, lab trends, I/O's, PO intake, supplement tolerance  Reason for Assessment: Low Braden Score  80 y.o. female  Admitting Dx: hyponatremia  ASSESSMENT: PMHx significant for CAD, HTN, DM2, CKD IV. Admitted with weakness, lethargy and falls x 2 weeks. Work-up reveals hypovolemic hyponatremia.  At baseline, pt needs full assistance for all ADL's. Renal consult pending. Ordered 2/2 electrolyte issues.  Pt with 11% wt loss x 1 year - this is not significant for time frame. Pt reports that she has a great appetite, however chart reveals patient is eating <50%. She continues to ask to use the restroom - RD notified nurse tech.  Sodium low at 123 Potassium WNL CBG's: 90 - 122  Height: Ht Readings from Last 1 Encounters:  09/01/13 5\' 5"  (1.651 m)    Weight: Wt Readings from Last 1 Encounters:  09/03/13 188 lb 0.8 oz (85.3 kg)    Ideal Body Weight: 125 lb  % Ideal Body Weight: 150%  Wt Readings from Last 10 Encounters:  09/03/13 188 lb 0.8 oz (85.3 kg)  08/26/13 184 lb 8 oz (83.689 kg)  08/27/12 211 lb 8 oz (95.936 kg)  07/24/08 222 lb (100.699 kg)    Usual Body Weight: 211 lb  % Usual Body Weight: 89%  BMI:  Body mass index is 31.29 kg/(m^2). Obese Class I  Estimated Nutritional Needs: Kcal: 1450 - 1650 Protein: 60 - 75 g Fluid: 1.2 liters per MD  Skin: fissure on sacral split  Diet Order: Carb Control with 1200 ml Fluid Restriction  EDUCATION NEEDS: -No education needs identified at this  time   Intake/Output Summary (Last 24 hours) at 09/04/13 1045 Last data filed at 09/03/13 1700  Gross per 24 hour  Intake    180 ml  Output      0 ml  Net    180 ml    Last BM: PTA  Labs:   Recent Labs Lab 09/02/13 1634 09/03/13 1347 09/03/13 2152  NA 121* 123* 123*  K 3.8 3.5* 3.8  CL 86* 87* 88*  CO2 18* 22 21  BUN 32* 30* 31*  CREATININE 2.14* 2.07* 1.95*  CALCIUM 8.0* 8.2* 8.4  GLUCOSE 104* 97 93    CBG (last 3)   Recent Labs  09/03/13 1623 09/03/13 2204 09/04/13 0737  GLUCAP 122* 92 90    Scheduled Meds: . allopurinol  100 mg Oral Daily  . aspirin EC  81 mg Oral Daily  . docusate sodium  100 mg Oral BID  . enoxaparin (LOVENOX) injection  30 mg Subcutaneous Q24H  . furosemide  80 mg Oral BID  . levothyroxine  50 mcg Oral QAC breakfast  . lubiprostone  8 mcg Oral BID  . oxybutynin  5 mg Oral BID    Continuous Infusions:   Past Medical History  Diagnosis Date  . CAD (coronary artery disease)   . Gout   . Hyperlipemia   . Hypertension   . Diabetes mellitus without complication   . MI (myocardial infarction) August 1999    PCI RCA  .  Anemia   . Diastolic heart failure   . Chronic kidney disease, stage IV (severe)   . Shortness of breath   . Arthritis     KNEES & BACK     Past Surgical History  Procedure Laterality Date  . Abdominal surgery    . Replacement total knee Left   . Total shoulder replacement Right   . Appendectomy    . Cholecystectomy    . Cardiac catheterization  1999    LAD OK, OM2 70%, RCA 90/80% with spont dissection, S/P PCI RCA    Jarold MottoSamantha Elmor Kost MS, RD, LDN Inpatient Registered Dietitian Pager: [phone] After-hours pager: [phone]

## 2013-09-04 NOTE — Clinical Social Work Note (Signed)
CSW talked with patient and son regarding SNF placement for short-term rehab (full assessment to follow) and they are in agreement. [place]   Genelle BalVanessa Gwendlyn Hanback, MSW, LCSW [phone]

## 2013-09-04 NOTE — Progress Notes (Signed)
VASCULAR LAB PRELIMINARY  PRELIMINARY  PRELIMINARY  PRELIMINARY  Bilateral lower extremity venous duplex completed.    Preliminary report:  Image quality suboptimal.  No obvious evidence of deep or superficial vein thrombosis.   Mayzie Caughlin, RVT 09/04/2013, 3:06 PM

## 2013-09-05 DIAGNOSIS — E871 Hypo-osmolality and hyponatremia: Secondary | ICD-10-CM

## 2013-09-05 LAB — BASIC METABOLIC PANEL
Anion gap: 15 (ref 5–15)
BUN: 29 mg/dL — ABNORMAL HIGH (ref 6–23)
CALCIUM: 8.7 mg/dL (ref 8.4–10.5)
CO2: 22 meq/L (ref 19–32)
Chloride: 93 mEq/L — ABNORMAL LOW (ref 96–112)
Creatinine, Ser: 1.88 mg/dL — ABNORMAL HIGH (ref 0.50–1.10)
GFR calc Af Amer: 27 mL/min — ABNORMAL LOW (ref >90)
GFR calc non Af Amer: 23 mL/min — ABNORMAL LOW (ref >90)
GLUCOSE: 94 mg/dL (ref 70–99)
Potassium: 3.8 mEq/L (ref 3.7–5.3)
SODIUM: 130 meq/L — AB (ref 137–147)

## 2013-09-05 LAB — GLUCOSE, CAPILLARY
GLUCOSE-CAPILLARY: 90 mg/dL (ref 70–99)
Glucose-Capillary: 120 mg/dL — ABNORMAL HIGH (ref 70–99)

## 2013-09-05 MED ORDER — ENSURE COMPLETE PO LIQD: 237.0000 mL | Freq: Two times a day (BID) | ORAL | Status: DC

## 2013-09-05 MED ORDER — DSS 100 MG PO CAPS: 100.0000 mg | ORAL_CAPSULE | Freq: Two times a day (BID) | ORAL | Status: AC

## 2013-09-05 NOTE — Clinical Social Work Psychosocial (Signed)
Clinical Social Work Department BRIEF PSYCHOSOCIAL ASSESSMENT 09/05/2013  Patient:  [name] [name],[name] [name]     Account Number:  [id number]     Admit date:  09/01/2013  Clinical Social Worker:  Delmer IslamRAWFORD,Lavaughn Bisig, LCSW  Date/Time:  09/05/2013 08:04 AM  Referred by:  Physician  Date Referred:  09/03/2013 Referred for  SNF Placement   Other Referral:   Interview type:  Patient Other interview type:   CSW also talked with patient's son [name] GoodpastureKenneth [name] [phone]) and grandson [name] [name] who was at the bedside.    PSYCHOSOCIAL DATA Living Status:  FAMILY Admitted from facility:   Level of care:   Primary support name:  [name] [name] Primary support relationship to patient:  CHILD, ADULT Degree of support available:   CSW noted that son was very attentive to patient and her comfort during visit.    CURRENT CONCERNS Current Concerns  Post-Acute Placement   Other Concerns:    SOCIAL WORK ASSESSMENT / PLAN On 09/04/13 CSW spoke with patient and son Iantha FallenKenneth (and later with Dewitt Rotag'son Anthony) regarding discharge planning and recommendation of ST rehab. Initially son was hesitant and patient declined, however as conversation continued regarding the benefits of rehab and reinforcing that rehab is ST, patient and son agreed. CSW explained SNF search process and [place] them [place] [place]. [facility] as it is very close to where they live. Lucila MaineGrandson was concerned that patient would get to facility and have to remain there. CSW reassured grandson and patient that placement is short-term and patient can leave at any time.   Assessment/plan status:  Psychosocial Support/Ongoing Assessment of Needs Other assessment/ plan:   Information/referral to community resources:   [place] [facility]    PATIENT'S/FAMILY'S RESPONSE TO PLAN OF CARE: Patient and family initially hesitant, but eventually consented and is comfortable with going to rehab.

## 2013-09-05 NOTE — Discharge Summary (Addendum)
Physician Discharge Summary  [name] [name] ZOX:[id number] DOB: 12/16/1923 DOA: 09/01/2013  PCP: Laurena Slimmer, MD  Admit date: 09/01/2013 Discharge date: 09/05/2013  Recommendations for Outpatient Follow-up:  1. Transfer to SNF for ongoing PT/OT 2. Daily weights and if she gains more than 3-lbs in 1 day or 5-lbs in 7 days, please notify supervising MD 3. Repeat BMP on Monday and continue weekly BMP.  Referral to nephrology if renal function appears to worsen.    Discharge Diagnoses:  Principal Problem:   Dehydration with hyponatremia Active Problems:   DIABETES MELLITUS   HYPERLIPIDEMIA   HYPONATREMIA   HYPERTENSION   CAD   Anemia   Chronic diastolic heart failure   Hyponatremia   CKD (chronic kidney disease) stage 4, GFR 15-29 ml/min   Discharge Condition: stable, improved  Diet recommendation:  Regular  Wt Readings from Last 3 Encounters:  09/04/13 80.4 kg (177 lb 4 oz)  08/26/13 83.689 kg (184 lb 8 oz)  08/27/12 95.936 kg (211 lb 8 oz)    History of present illness:  80 year old female with a history of CAD with history of MI in 1999, hypertension, diabetes mellitus type 2, CKD stage IV presents with one to two-week history of generalized weakness, increased lethargy, and mechanical falls. The patient is a poor historian with suspected underlying dementia. All of this history is obtained from speaking with the patient's son and reviewed the medical record. In the past 2 weeks, the patient's son has noted increasing lethargy and sleepiness during the daytime. In addition, the patient has had decreased oral intake. There has not been any fevers, chills, chest pain, shortness breath, vomiting, diarrhea, hematochezia, melena. There is no dysuria, hematuria, rashes, headaches. The patient has chronic left upper extremity and left lower extremity weakness. At baseline, the patient needs full assistance of her son for all her activities of daily living. Given with the help of her  son in the past 3-4 days, the patient has had 2 falls. In fact, the patient fell and hit her right knee. The patient was brought to the emergency department on 08/26/2013 and with subsequent was sent home. The patient has not had any worsening shortness breath, orthopnea, lower extremity edema or increased abdominal girth. She last saw her primary care provider 3 months ago. Percent states that she has not been weighed since that period of time.   In the emergency department, the patient was found to have a sodium of 118, serum creatinine 2.46. Hepatic enzymes are normal. WBC was 11.0. Urinalysis was negative for pyuria. CT of the brain was negative for acute findings. Chest x-ray was negative. X-ray of the right knee showed moderate effusion with degenerative joint disease.   Hospital Course:   Acute on chronic hyponatremia likely secondary to dehydration. Her Lasix were discontinued and she was started on IV fluids. Her serum osmolality and her urine has some spur 140 however this was in the setting of Lasix use. Her fractional excretion of sodium was 0.8% suggesting dehydration. Her TSH and cortisol levels were within normal limits. Her echocardiogram demonstrated preserved ejection fraction of 55-60% with grade 1 diastolic dysfunction. Renal ultrasound demonstrated no evidence of obstruction, however, her bladder scan contained of retained urine which was relieved by foley catheter.  Her oxybutynin was discontinued and foley discontinued.  Because she was slow to recover in her sodium nephrology was consulted and recommended holding Lasix and giving IV fluids. Gradually her sodium recovered to 130 which is near her baseline. She  likely has a combination of hyponatremia from CKD and recent office that due to her age that prevent her from completely normalizing her sodium.  Gait instability and frequent falls. She was evaluated by physical therapy recommended skilled nursing facility for acute  rehabilitation. The family was hesitant to send her family member to skilled nursing however her primary caretaker has recently injured his back and needs to have some recovery time. She will go to a skilled nursing for [name]-term rehabilitation and anticipate that she will discharged back to family care once her son has recovered.  Hypertension, intermittent hypotension resolved after stopping her beta lobe.  Chronic diastolic heart failure/right-sided congestive heart failure. She appeared hypovolemic to euvolemic except for right lower extremity swelling. Recommend holding her Lasix because she does not eat well and checking daily weights. If she gains more than 3 pounds in one day or 5 pounds in 7 days, please contact her supervising physician.  Diabetes mellitus type 2 with hemoglobin A1c of 5.5. Stopped her medications and recommend regular diet to encourage as much oral intake as possible. Repeat A1c in approximately 3 months.  Hypothyroidism, TSH within normal limits, continue Synthroid.  Anemia of chronic kidney disease with baseline hemoglobin between 9 and 10 mg a deciliter. This remained stable.  Chronic kidney disease stage IV with baseline creatinine of 2.4-2.6. Her creatinine trended down to 1.88 with IV fluids. Renal ultrasound demonstrated echogenic kidneys consistent with medical renal disease.  Right knee pain/effusion. Her x-rays demonstrated advanced tricompartmental degenerative changes a moderate sized joint effusion. Just so who recently had a fall and hit her right knee. She developed 2+ pitting edema of the right lower extremity but duplex was negative for DVT. Consider referral to orthopedics if her right knee appears to be interfering with her ability to participate in therapies. ` Consultants:  None Procedures:  CT head  CXR  XR knee right Antibiotics:  none    Discharge Exam: Filed Vitals:   09/05/13 0544  BP: 147/61  Pulse: 60  Temp: 98.3 F (36.8 C)   Resp: 18   Filed Vitals:   09/04/13 1506 09/04/13 1656 09/04/13 2006 09/05/13 0544  BP: 110/50 114/59 129/79 147/61  Pulse: 68 71 63 60  Temp:  98.6 F (37 C) 97.8 F (36.6 C) 98.3 F (36.8 C)  TempSrc:  Oral    Resp:  18 18 18   Height:      Weight:   80.4 kg (177 lb 4 oz)   SpO2:  96% 99% 98%    General: BF, No acute distress, sleeping but arouseable  HEENT: NCAT, MMM  Cardiovascular: RRR, nl S1, S2 no mrg, 2+ pulses, warm extremities  Respiratory:  CTAB, no increased WOB  Abdomen: NABS, soft, NT/ND  MSK: Normal tone and bulk, 1+ RLE pitting edema with joint effusion, no LLE edema  Neuro: Diffusely weak.   Discharge Instructions      Discharge Instructions   (HEART FAILURE PATIENTS) Call MD:  Anytime you have any of the following symptoms: 1) 3 pound weight gain in 24 hours or 5 pounds in 1 week 2) shortness of breath, with or without a dry hacking cough 3) swelling in the hands, feet or stomach 4) if you have to sleep on extra pillows at night in order to breathe.    Complete by:  As directed      Call MD for:  difficulty breathing, headache or visual disturbances    Complete by:  As directed  Call MD for:  extreme fatigue    Complete by:  As directed      Call MD for:  hives    Complete by:  As directed      Call MD for:  persistant dizziness or light-headedness    Complete by:  As directed      Call MD for:  persistant nausea and vomiting    Complete by:  As directed      Call MD for:  severe uncontrolled pain    Complete by:  As directed      Call MD for:  temperature >100.4    Complete by:  As directed      Diet general    Complete by:  As directed      Increase activity slowly    Complete by:  As directed             Medication List    STOP taking these medications       carvedilol 6.25 MG tablet  Commonly known as:  COREG     furosemide 80 MG tablet  Commonly known as:  LASIX     glipiZIDE 2.5 MG 24 hr tablet  Commonly known as:   GLUCOTROL XL     glucose 4 GM chewable tablet     oxybutynin 5 MG tablet  Commonly known as:  DITROPAN      TAKE these medications       allopurinol 100 MG tablet  Commonly known as:  ZYLOPRIM  Take 100 mg by mouth daily.     aspirin EC 81 MG tablet  Take 81 mg by mouth daily.     DSS 100 MG Caps  Take 100 mg by mouth 2 (two) times daily.     feeding supplement (ENSURE COMPLETE) Liqd  Take 237 mLs by mouth 2 (two) times daily between meals.     levothyroxine 50 MCG tablet  Commonly known as:  SYNTHROID, LEVOTHROID  Take 50 mcg by mouth daily before breakfast.     lubiprostone 8 MCG capsule  Commonly known as:  AMITIZA  Take 8 mcg by mouth 2 (two) times daily.     nystatin-triamcinolone cream  Commonly known as:  MYCOLOG II  Apply 1 application topically daily as needed (antifungal).          The results of significant diagnostics from this hospitalization (including imaging, microbiology, ancillary and laboratory) are listed below for reference.    Significant Diagnostic Studies: Dg Chest 2 View  09/01/2013   CLINICAL DATA:  Status post fall.  Altered mental status.  EXAM: CHEST  2 VIEW  COMPARISON:  Single view of the chest 08/24/2012.  FINDINGS: The patient is rotated on the study. Lungs are clear. There is cardiomegaly. No pneumothorax or pleural effusion. Right shoulder replacement is noted. The left humeral has been removed.  IMPRESSION: No acute abnormality.   Electronically Signed   By: Drusilla Kanner M.D.   On: 09/01/2013 14:27   Ct Head Wo Contrast  09/01/2013   CLINICAL DATA:  Fall, altered mental status  EXAM: CT HEAD WITHOUT CONTRAST  TECHNIQUE: Contiguous axial images were obtained from the base of the skull through the vertex without intravenous contrast.  COMPARISON:  05/17/2007  FINDINGS: There is no evidence of mass effect, midline shift, or extra-axial fluid collections. There is no evidence of a space-occupying lesion or intracranial hemorrhage.  There is no evidence of a cortical-based area of acute infarction. There is generalized cerebral atrophy.  There is periventricular white matter low attenuation likely secondary to microangiopathy.  The ventricles and sulci are appropriate for the patient's age. The basal cisterns are patent.  Visualized portions of the orbits are unremarkable. The visualized portions of the paranasal sinuses and mastoid air cells are unremarkable. Cerebrovascular atherosclerotic calcifications are noted.  The osseous structures are unremarkable.  IMPRESSION: No acute intracranial pathology.   Electronically Signed   By: Elige Ko   On: 09/01/2013 14:39   US Renal  09/04/2013   CLINICAL DATA:  Chronic kidney disease, hyponatremia.  EXAM: RENAL/URINARY TRACT ULTRASOUND COMPLETE  COMPARISON:  None.  FINDINGS: Right Kidney:  Length: 9.7. Increased cortical echogenicity without focal mass, hydronephrosis or echogenic nephrolithiasis.  Left Kidney:  Length: 10.8 cm. Increased cortical echogenicity without focal mass, hydronephrosis or echogenic nephrolithiasis.  Bladder:  Decompressed containing a Foley catheter  IMPRESSION: Echogenic kidneys suggest medical renal disease without obstructive uropathy.   Electronically Signed   By: Awilda Metro   On: 09/04/2013 17:50   Dg Knee Complete 4 Views Right  09/01/2013   CLINICAL DATA:  Larey Seat.  Right knee pain.  EXAM: RIGHT KNEE - COMPLETE 4+ VIEW  COMPARISON:  None.  FINDINGS: Advanced tricompartmental degenerative changes with joint space narrowing, osteophytic spurring, subchondral cystic change and bony eburnation. There is also a moderate-sized joint effusion. No acute bony abnormality.  IMPRESSION: Advanced tricompartmental degenerative changes and moderate-sized joint effusion.   Electronically Signed   By: Loralie Champagne M.D.   On: 09/01/2013 14:27    Microbiology: Recent Results (from the past 240 hour(s))  URINE CULTURE     Status: None   Collection Time    08/26/13   7:03 PM      Result Value Ref Range Status   Specimen Description URINE, RANDOM   Final   Special Requests NONE   Final   Culture  Setup Time     Final   Value: 08/27/2013 01:26     Performed at [facility] Count     Final   Value: NO GROWTH     Performed at [facility]   Culture     Final   Value: NO GROWTH     Performed at [facility]   Report Status 08/27/2013 FINAL   Final  URINE CULTURE     Status: None   Collection Time    09/01/13  1:42 PM      Result Value Ref Range Status   Specimen Description URINE, CATHETERIZED   Final   Special Requests NONE   Final   Culture  Setup Time     Final   Value: 09/01/2013 17:30     Performed at [facility] Count     Final   Value: NO GROWTH     Performed at [facility]   Culture     Final   Value: NO GROWTH     Performed at [facility]   Report Status 09/02/2013 FINAL   Final     Labs: Basic Metabolic Panel:  Recent Labs Lab 09/02/13 1033 09/02/13 1634 09/03/13 1347 09/03/13 2152 09/05/13 0525  NA 124* 121* 123* 123* 130*  K 3.5* 3.8 3.5* 3.8 3.8  CL 86* 86* 87* 88* 93*  CO2 21 18* 22 21 22   GLUCOSE 64* 104* 97 93 94  BUN 32* 32* 30* 31* 29*  CREATININE 2.23* 2.14* 2.07* 1.95* 1.88*  CALCIUM 8.3* 8.0* 8.2*  8.4 8.7   Liver Function Tests:  Recent Labs Lab 09/01/13 1357  AST 16  ALT 7  ALKPHOS 68  BILITOT 0.7  PROT 6.5  ALBUMIN 3.4*   No results found for this basename: LIPASE, AMYLASE,  in the last 168 hours No results found for this basename: AMMONIA,  in the last 168 hours CBC:  Recent Labs Lab 09/01/13 1357 09/02/13 1034  WBC 11.0* 10.3  HGB 10.7* 10.1*  HCT 30.2* 28.9*  MCV 84.4 86.0  PLT 205 188   Cardiac Enzymes:  Recent Labs Lab 09/01/13 1357  TROPONINI <0.30   BNP: BNP (last 3 results)  Recent Labs  09/02/13 1015  PROBNP 3987.0*   CBG:  Recent Labs Lab 09/04/13 0737 09/04/13 1201 09/04/13 1637  09/04/13 2002 09/05/13 0727  GLUCAP 90 101* 101* 97 90    Time coordinating discharge: 35 minutes  Signed:  Aidenjames Heckmann  [facility] 09/05/2013, 10:28 AM

## 2013-09-05 NOTE — Progress Notes (Signed)
S:VSS.  BP rising today.  Pt is very sleepy this AM and "want to sleep."  She denies any CP, SOB, N/V.  Good UOP over 3.6L yesterday.   O:BP 147/61  Pulse 60  Temp(Src) 98.3 F (36.8 C) (Oral)  Resp 18  Ht 5\' 5"  (1.651 m)  Wt 177 lb 4 oz (80.4 kg)  BMI 29.50 kg/m2  SpO2 98%  Intake/Output Summary (Last 24 hours) at 09/05/13 0929 Last data filed at 09/05/13 0600  Gross per 24 hour  Intake 781.67 ml  Output   3650 ml  Net -2868.33 ml    Intake/Output: I/O last 3 completed shifts: In: 901.7 [P.O.:300; I.V.:601.7] Out: 3650 [Urine:3650]   Weight change: Filed Weights   09/02/13 2132 09/03/13 2205 09/04/13 2006  Weight: 185 lb 6.5 oz (84.1 kg) 188 lb 0.8 oz (85.3 kg) 177 lb 4 oz (80.4 kg)   Gen:NAD CVS:RRR Resp:CTAB Abd:+bs, soft, nt/nd Ext:RLE pitting edema, asymmetric 2-3+, LLE no pretibial edema but +pedal edema with wrinkling of skin over her left foot    Recent Labs Lab 09/01/13 1357 09/01/13 2205 09/02/13 0204 09/02/13 1033 09/02/13 1634 09/03/13 1347 09/03/13 2152 09/05/13 0525  NA 118* 123* 122* 124* 121* 123* 123* 130*  K 3.6* 4.0 3.5* 3.5* 3.8 3.5* 3.8 3.8  CL 82* 83* 84* 86* 86* 87* 88* 93*  CO2 21 21 22 21  18* 22 21 22   GLUCOSE 130* 80 101* 64* 104* 97 93 94  BUN 33* 33* 33* 32* 32* 30* 31* 29*  CREATININE 2.46* 2.34* 2.29* 2.23* 2.14* 2.07* 1.95* 1.88*  ALBUMIN 3.4*  --   --   --   --   --   --   --   CALCIUM 8.5 8.3* 8.1* 8.3* 8.0* 8.2* 8.4 8.7  AST 16  --   --   --   --   --   --   --   ALT 7  --   --   --   --   --   --   --     Liver Function Tests:  Recent Labs Lab 09/01/13 1357  AST 16  ALT 7  ALKPHOS 68  BILITOT 0.7  PROT 6.5  ALBUMIN 3.4*   No results found for this basename: LIPASE, AMYLASE,  in the last 168 hours No results found for this basename: AMMONIA,  in the last 168 hours  CBC:  Recent Labs Lab 09/01/13 1357 09/02/13 1034  WBC 11.0* 10.3  HGB 10.7* 10.1*  HCT 30.2* 28.9*  MCV 84.4 86.0  PLT 205 188     Cardiac Enzymes:  Recent Labs Lab 09/01/13 1357  TROPONINI <0.30    CBG:  Recent Labs Lab 09/04/13 0737 09/04/13 1201 09/04/13 1637 09/04/13 2002 09/05/13 0727  GLUCAP 90 101* 101* 97 90    Iron Studies:  No results found for this basename: IRON, TIBC, TRANSFERRIN, FERRITIN,  in the last 72 hours  Studies/Results: [facility]  09/04/2013   CLINICAL DATA:  Chronic kidney disease, hyponatremia.  EXAM: RENAL/URINARY TRACT ULTRASOUND COMPLETE  COMPARISON:  None.  FINDINGS: Right Kidney:  Length: 9.7. Increased cortical echogenicity without focal mass, hydronephrosis or echogenic nephrolithiasis.  Left Kidney:  Length: 10.8 cm. Increased cortical echogenicity without focal mass, hydronephrosis or echogenic nephrolithiasis.  Bladder:  Decompressed containing [name] Foley catheter  IMPRESSION: Echogenic kidneys suggest medical renal disease without obstructive uropathy.   Electronically Signed   By: Awilda Metroourtnay  Bloomer   On: 09/04/2013 17:50   .  allopurinol  100 mg Oral Daily  . aspirin EC  81 mg Oral Daily  . docusate sodium  100 mg Oral BID  . enoxaparin (LOVENOX) injection  30 mg Subcutaneous Q24H  . feeding supplement (ENSURE COMPLETE)  237 mL Oral BID BM  . levothyroxine  50 mcg Oral QAC breakfast  . lubiprostone  8 mcg Oral BID    BMET    Component Value Date/Time   NA 130* 09/05/2013 0525   K 3.8 09/05/2013 0525   CL 93* 09/05/2013 0525   CO2 22 09/05/2013 0525   GLUCOSE 94 09/05/2013 0525   BUN 29* 09/05/2013 0525   CREATININE 1.88* 09/05/2013 0525   CALCIUM 8.7 09/05/2013 0525   GFRNONAA 23* 09/05/2013 0525   GFRAA 27* 09/05/2013 0525    CBC    Component Value Date/Time   WBC 10.3 09/02/2013 1034   RBC 3.36* 09/02/2013 1034   RBC 2.81* 08/25/2012 1130   HGB 10.1* 09/02/2013 1034   HCT 28.9* 09/02/2013 1034   PLT 188 09/02/2013 1034   MCV 86.0 09/02/2013 1034   MCH 30.1 09/02/2013 1034   MCHC 34.9 09/02/2013 1034   RDW 13.7 09/02/2013 1034   LYMPHSABS 1.8 08/24/2012 1640   MONOABS 0.5  08/24/2012 1640   EOSABS 0.4 08/24/2012 1640   BASOSABS 0.0 08/24/2012 1640     Assessment/Plan:  1.   Chronic mild tendency toward hyponatremia with subacute drop in Na over the past 1-2 weeks.- improving on NS@50ml /hr.  Likely due to volume depletion in the setting of decreased po intake and continuing home lasix; would continue gentle IVF; Renal US: echogenic kidneys suggest medical renal disease without obstructive  uropathy; bladder scan with >600cc, inserted foley catheter yesterday and d/c oxybutynin   2.   Acute encephalopathy- unlikely related to problem #1 given she was having these symptoms when NA was as high as 127; likely has some underlying dementia; d/c oxybutynin in the setting of dementia   3.   Controlled DM- last HA1c 5.5   4.   CKD4- Scr trending down; continue to monitor;   5.   Recurrent falls- likely related to problem #1 and use of oxybutynin, OT recommending SNF  6.   Diastolic dysfunction- BNP actually down from 1 yr ago in the setting of CKD   7.   CAD- continue current meds  [name] SalvageJacquelyn S Gill, MD PGY-2, Internal Medicine Teaching Service I have seen and examined this patient and agree with plan as outlined by Dr. Delane GingerGill.  Her hyponatremia has improved off of the oxybutynin and lasix.  She had appropriately low Uosm despite her underlying CKD.  Agree wil holding these agents and have her follow up with her PCP as an outpt.  Since she is being discharged will sign off.  Please call with questions or concerns. [name] Geeting A,MD 09/05/2013 10:35 AM

## 2013-09-05 NOTE — Progress Notes (Signed)
Patient and son agreeable to transfer to SNF. Report called to nurse Dayla at [facility]. Situation, background, actions, and orders reviewed. Son on site to assist with personal belongings at time of discharge. IV d/c'd. Catheter intact. Patient left with foley catheter in place. Pink foam on coccyx intact. Discharge to SNF, transported via ambulance. Anedra Penafiel K

## 2013-09-05 NOTE — Clinical Social Work Placement (Addendum)
Clinical Social Work Department CLINICAL SOCIAL WORK PLACEMENT NOTE 09/05/2013  Patient:  [name] [name],[name] [name]  Account Number:  [id number] Admit date:  09/01/2013  Clinical Social Worker:  Genelle BalVANESSA Giavonni Fonder, LCSW  Date/time:  09/05/2013 08:14 AM  Clinical Social Work is seeking post-discharge placement for this patient at the following level of care:   SKILLED NURSING   (*CSW will update this form in Epic as items are completed)   09/04/2013  Patient/family provided with [facility] Department of Clinical Social Work's list of facilities offering this level of care within the geographic area requested by the patient (or if unable, by the patient's family).  09/04/2013  Patient/family informed of their freedom to choose among providers that offer the needed level of care, that participate in Medicare, Medicaid or managed care program needed by the patient, have an available bed and are willing to accept the patient.    Patient/family informed of [facility]' ownership interest in [facility], as well as of the fact that they are under no obligation to receive care at this facility.  PASARR submitted to EDS on 09/04/2013 PASARR number received on 09/04/2013  FL2 transmitted to all facilities in geographic area requested by pt/family on  09/04/2013 FL2 transmitted to all facilities within larger geographic area on   Patient informed that his/her managed care company has contracts with or will negotiate with  certain facilities, including the following:    Patient/family informed of bed offers received: 09/05/13  Patient/family chooses bed at [facility] Physician recommends and patient chooses bed at    Patient to be transferred to [facility] on 09/05/13   Patient to be transferred to facility by ambulance  Patient and family notified of transfer on 09/05/13 Name of family member notified: Son Carilyn GoodpastureKenneth Cammarata [phone]) and grandson Ethelene Brownsnthony Scales [phone]).   The following  physician request were entered in Epic:  Additional Comments: 09/05/13: CSW spoke with son and requested that he contact admissions staff at [facility] regarding admissions paperwork.

## 2013-09-05 NOTE — Progress Notes (Signed)
Occupational Therapy Treatment Patient Details Name: [name] [name] MRN: [id number] DOB: 04/28/1923 Today's Date: 09/05/2013    History of present illness  Pt. was admitted on 09/01/13 for falls and lethargy. Current porblem list includes hyponatremia, gait instability/falls, chronic diastolic heart failure wityh right sided CHF, anemia of CKD, R knee pain effusion from falling onto knees.    OT comments  Pt making slow progress with functional goals. Pt mod A + 2 for transfers, max - total A with LB ADLs and min a with UB ADLs seated at EOBOT will continue to follow  Follow Up Recommendations  SNF;Supervision/Assistance - 24 hour    Equipment Recommendations  None recommended by OT    Recommendations for Other Services      Precautions / Restrictions Precautions Precautions: Fall Restrictions Weight Bearing Restrictions: No       Mobility Bed Mobility Overal bed mobility: Needs Assistance Bed Mobility: Supine to Sit     Supine to sit: Max assist;HOB elevated     General bed mobility comments: max assist and use of bed pad to come to EOB and get feet positioned on floor  Transfers Overall transfer level: Needs assistance Equipment used: None     Stand pivot transfers: +2 physical assistance;Mod assist            Balance Overall balance assessment: Needs assistance Sitting-balance support: No upper extremity supported;Feet supported Sitting balance-Leahy Scale: Fair     Standing balance support: During functional activity;Bilateral upper extremity supported Standing balance-Leahy Scale: Poor                     ADL       Grooming: Wash/dry hands;Wash/dry face;Sitting;Min guard           Upper Body Dressing : Sitting;Minimal assistance       Toilet Transfer: Stand-pivot;Moderate assistance;+2 for physical assistance                    Vision  wears reading glasses per pt report                   Perception  Perception Perception Tested?: No   Praxis Praxis Praxis tested?: Not tested    Cognition   Behavior During Therapy: WFL for tasks assessed/performed Overall Cognitive Status: History of cognitive impairments - at baseline       Memory: Decreased short-term memory                                         General Comments  pt pleasant and cooperative    Pertinent Vitals/ Pain       No c/o pain, VSS                                                          Frequency Min 2X/week     Progress Toward Goals  OT Goals(current goals can now be found in the care plan section)  Progress towards OT goals: Progressing toward goals     Plan Discharge plan remains appropriate                     End of Session Equipment Utilized During  Treatment: Gait belt   Activity Tolerance Patient tolerated treatment well   Patient Left in chair;with call bell/phone within reach;with chair alarm set   Nurse Communication          Time: [id number] OT Time Calculation (min): 24 min  Charges: OT General Charges $OT Visit: 1 Procedure OT Treatments $Self Care/Home Management : 8-22 mins $Therapeutic Activity: 8-22 mins  [name], [name] [name] 09/05/2013, 1:01 PM

## 2013-09-10 ENCOUNTER — Encounter: Payer: Self-pay | Admitting: Adult Health

## 2013-09-10 ENCOUNTER — Non-Acute Institutional Stay (SKILLED_NURSING_FACILITY): Payer: Medicare Other | Admitting: Adult Health

## 2013-09-10 DIAGNOSIS — I5032 Chronic diastolic (congestive) heart failure: Secondary | ICD-10-CM

## 2013-09-10 DIAGNOSIS — M109 Gout, unspecified: Secondary | ICD-10-CM

## 2013-09-10 DIAGNOSIS — N184 Chronic kidney disease, stage 4 (severe): Secondary | ICD-10-CM

## 2013-09-10 DIAGNOSIS — E871 Hypo-osmolality and hyponatremia: Secondary | ICD-10-CM

## 2013-09-10 DIAGNOSIS — K5900 Constipation, unspecified: Secondary | ICD-10-CM

## 2013-09-10 DIAGNOSIS — R5381 Other malaise: Secondary | ICD-10-CM

## 2013-09-10 DIAGNOSIS — E1129 Type 2 diabetes mellitus with other diabetic kidney complication: Secondary | ICD-10-CM | POA: Insufficient documentation

## 2013-09-10 NOTE — Progress Notes (Signed)
Patient ID: [name] [name], female   DOB: 09/05/1923, 80 y.o.   MRN: [id number]     [facility]  Allergies  Allergen Reactions  . Prinivil [Lisinopril]     Pt reports unaware of allergy      Chief Complaint  Patient presents with  . Hospitalization Follow-up    HPI:  She has been hospitalized for weakness; dehydration; hyponatremia; diastolic heart failure. She is here for short term rehab. The goal of her care is to return home with family. She is unable to fully participate in the hpi or ros. There are no concerns being voiced by the nursing staff at this time.    Past Medical History  Diagnosis Date  . CAD (coronary artery disease)   . Gout   . Hyperlipemia   . Hypertension   . Diabetes mellitus without complication   . MI (myocardial infarction) August 1999    PCI RCA  . Anemia   . Diastolic heart failure   . Chronic kidney disease, stage IV (severe)   . Shortness of breath   . Arthritis     KNEES & BACK     Past Surgical History  Procedure Laterality Date  . Abdominal surgery    . Replacement total knee Left   . Total shoulder replacement Right   . Appendectomy    . Cholecystectomy    . Cardiac catheterization  1999    LAD OK, OM2 70%, RCA 90/80% with spont dissection, S/P PCI RCA    VITAL SIGNS BP 147/80  Pulse 66  Ht 5\' 3"  (1.6 m)  Wt 178 lb 8 oz (80.967 kg)  BMI 31.63 kg/m2  SpO2 99%   Patient's Medications  New Prescriptions   No medications on file  Previous Medications   ALLOPURINOL (ZYLOPRIM) 100 MG TABLET    Take 100 mg by mouth daily.   ASPIRIN EC 81 MG TABLET    Take 81 mg by mouth daily.   DOCUSATE SODIUM 100 MG CAPS    Take 100 mg by mouth 2 (two) times daily.   FEEDING SUPPLEMENT, ENSURE COMPLETE, (ENSURE COMPLETE) LIQD    Take 237 mLs by mouth 2 (two) times daily between meals.   LEVOTHYROXINE (SYNTHROID, LEVOTHROID) 50 MCG TABLET    Take 50 mcg by mouth daily before breakfast.   LUBIPROSTONE (AMITIZA) 8 MCG CAPSULE    Take 8  mcg by mouth 2 (two) times daily.    NYSTATIN-TRIAMCINOLONE (MYCOLOG II) CREAM    Apply 1 application topically daily as needed (antifungal).   Modified Medications   No medications on file  Discontinued Medications   No medications on file    SIGNIFICANT DIAGNOSTIC EXAMS  629-15: chest x-ray: No acute abnormality.  09-01-13: right knee x-ray: Advanced tricompartmental degenerative changes and moderate-sized joint effusion.  09-01-13: ct of head: No acute intracranial pathology.  09-02-13: TEE: Left ventricle: The cavity size was normal. There was mild concentric hypertrophy. Systolic function was normal. The estimated ejection fraction was in the range of 55% to 60%. Wall motion was normal; there were no regional wall motion abnormalities. Doppler parameters are consistent with abnormal left ventricular relaxation (grade 1 diastolic dysfunction). Doppler parameters are consistent with elevated ventricular end-diastolic filling pressure. - Aortic valve: Trileaflet; moderately thickened, moderately calcified leaflets. Valve mobility was restricted. There was mild stenosis. There was mild regurgitation.  - Mitral valve: Structurally normal valve. - Right ventricle: The cavity size was mildly dilated. Wall thickness was normal. Systolic function was mildly reduced. -  Right atrium: The atrium was normal in size. - Tricuspid valve: There was trivial regurgitation. - Pulmonary arteries: Systolic pressure was within the normal range.  09-04-13: renal ultrasound: Echogenic kidneys suggest medical renal disease without obstructive uropathy.   09-04-13: bilateral lower extremity dopplers: No evidence of deep vein or superficial thrombosis involving the right lower extremity and left lower extremity.   LABS REVIEWED:   08-26-13: wbc 9.1; hgb 9.6; hct 28.4; mcv 86.6; ;plt 192; glucose 131; bun 29; creat 2.60; k+3.6; na++127; urine culture: no growth 09-01-13: wbc 11.0; hgb 10.7; hct 30.2; mcv 84.4; plt  205; glucose 130; bun 33; creat 2.46; k+3.6; na++118; liver normal albumin 3.4; urine culture: no growth. tsh 2.92; hgb a1c 5.5; urine na++24;  urine creat 52.99; osmolality: 259 09-03-13; glucose 97; bun 30;creat 2.7; k+3.5; na++127; cortisol 14.9  09-05-13: glucose 94; bun 29; creat 1.88; k+3.8; na++130;     Review of Systems  Unable to perform ROS    Physical Exam  Constitutional: She appears well-developed and well-nourished. No distress.  Neck: Neck supple. No JVD present.  Cardiovascular: Normal rate, regular rhythm and intact distal pulses.   Respiratory: Effort normal and breath sounds normal. No respiratory distress. She has no wheezes.  GI: Soft. Bowel sounds are normal. She exhibits no distension. There is no tenderness.  Musculoskeletal:  Has 2+ pedal edema;  Left sided weakness present.   Neurological: She is alert.  Skin: Skin is warm and dry. She is not diaphoretic.      ASSESSMENT/ PLAN:  1. Chronic diastolic heart failure: she is presently stable; is on daily weights. She is currently off lasix at this time due to her hyponatremia. Will not make changes at this time and will continue to monitor her status   2. Hyponatremia: will not make changes at this time; will continue her monitor her na++  3. Hypothyroidism: will continue synthroid 50 mcg daily her tsh is 2.92.  4. Diabetes: her hgb a1c is 5.5 her medications were stopped in the hospital. At this time will not make changes; she does not require medications. Will check a hgb a1c in the next 3 months. Will monitor her status.   5. Gout: no recent flare will continue allopurinol 100 mg daily  6. Constipation: will continue colace twice daily and amitiza 8 mcg twice daily   7. Physical deconditioning: will continue therapy as directed; to improve upon her strength and gait.    Time spent with patient 50 minutes    Synthia Innocenteborah Tosh Glaze NP [facility]  Contact [phone] Monday through Friday 8am-  5pm  After hours call [phone]

## 2013-09-15 ENCOUNTER — Encounter: Payer: Self-pay | Admitting: Internal Medicine

## 2013-09-15 NOTE — Progress Notes (Signed)
Patient ID: [name] [name], female   DOB: 06/26/1923, 80 y.o.   MRN: [id number]  This encounter was created in error - please disregard.

## 2013-09-17 ENCOUNTER — Encounter: Payer: Self-pay | Admitting: Adult Health

## 2013-09-17 ENCOUNTER — Non-Acute Institutional Stay (SKILLED_NURSING_FACILITY): Payer: Medicare Other | Admitting: Adult Health

## 2013-09-17 DIAGNOSIS — L03115 Cellulitis of right lower limb: Secondary | ICD-10-CM | POA: Insufficient documentation

## 2013-09-17 DIAGNOSIS — E1129 Type 2 diabetes mellitus with other diabetic kidney complication: Secondary | ICD-10-CM

## 2013-09-17 DIAGNOSIS — L03119 Cellulitis of unspecified part of limb: Secondary | ICD-10-CM

## 2013-09-17 DIAGNOSIS — E860 Dehydration: Secondary | ICD-10-CM

## 2013-09-17 DIAGNOSIS — L02419 Cutaneous abscess of limb, unspecified: Secondary | ICD-10-CM

## 2013-09-17 DIAGNOSIS — E871 Hypo-osmolality and hyponatremia: Secondary | ICD-10-CM

## 2013-09-17 NOTE — Assessment & Plan Note (Signed)
Worsening. He rBUn has increased to 42. She states that she is not drinking enough water. She is not really capable of remembering to push fluids. Will given NS at 60 cc/hr for 1 L and recheck her BMP in the morning. There has been difficulty gaining IV access so we will go with clysis for gentle hydration

## 2013-09-17 NOTE — Progress Notes (Signed)
Patient ID: [name] [name], female   DOB: 11/07/1923, 80 y.o.   MRN: [id number]   [facility] (31)  Code Status:  Full code  Chief Complaint  Patient presents with  . f/u leg edema    HPI: This is an 80 y.o. AA female living at [facility]. She was in the hospital in June 2015 with dehydration and hyponatremia. She also has CKD stage IV. She reports that she is not drinking enough water but is eating well. She was noted to have RLE edema with warmth. ON 09/16/13 a RLE doppler was neg for DVT. She reports some pain to the right leg. She has small skin tears to her right achilles area. The staff had difficulty drawing blood from her due to probable dehydration They were finally able to get a BMP this morning and her BUN had increased to 42, which was previously 29. Her Cr has risen to 2.2, previously 1.88. She is alert and pleasant for our visit but has dementia and can't provide an extensive hx but can answer yes and no.   Allergies  Allergen Reactions  . Prinivil [Lisinopril]     Pt reports unaware of allergy    MEDICATIONS -  Reviewed Current outpatient prescriptions:allopurinol (ZYLOPRIM) 100 MG tablet, Take 100 mg by mouth daily., Disp: , Rfl: ;  aspirin EC 81 MG tablet, Take 81 mg by mouth daily., Disp: , Rfl: ;  docusate sodium 100 MG CAPS, Take 100 mg by mouth 2 (two) times daily., Disp: 10 capsule, Rfl: 0;  feeding supplement, ENSURE COMPLETE, (ENSURE COMPLETE) LIQD, Take 237 mLs by mouth 2 (two) times daily between meals., Disp: , Rfl:  levothyroxine (SYNTHROID, LEVOTHROID) 50 MCG tablet, Take 50 mcg by mouth daily before breakfast., Disp: , Rfl: ;  lubiprostone (AMITIZA) 8 MCG capsule, Take 8 mcg by mouth 2 (two) times daily. , Disp: , Rfl: ;  nystatin-triamcinolone (MYCOLOG II) cream, Apply 1 application topically daily as needed (antifungal). , Disp: , Rfl:   DATA REVIEWED  SIGNIFICANT DIAGNOSTIC EXAMS  09-01-13: chest x-ray: No acute  abnormality.  09-01-13: right knee x-ray: Advanced tricompartmental degenerative changes and moderate-sized joint effusion.  09-01-13: ct of head: No acute intracranial pathology.  09-02-13: TEE: Left ventricle: The cavity size was normal. There was mild concentric hypertrophy. Systolic function was normal. The estimated ejection fraction was in the range of 55% to 60%. Wall motion was normal; there were no regional wall motion abnormalities. Doppler parameters are consistent with abnormal left ventricular relaxation (grade 1 diastolic dysfunction). Doppler parameters are consistent with elevated ventricular end-diastolic filling pressure. - Aortic valve: Trileaflet; moderately thickened, moderately calcified leaflets. Valve mobility was restricted. There was mild stenosis. There was mild regurgitation.  - Mitral valve: Structurally normal valve. - Right ventricle: The cavity size was mildly dilated. Wall thickness was normal. Systolic function was mildly reduced. - Right atrium: The atrium was normal in size. - Tricuspid valve: There was trivial regurgitation. - Pulmonary arteries: Systolic pressure was within the normal range.  09-04-13: renal ultrasound: Echogenic kidneys suggest medical renal disease without obstructive uropathy.   09-04-13: bilateral lower extremity dopplers: No evidence of deep vein or superficial thrombosis involving the right lower extremity and left lower extremity.   LABS REVIEWED:   08-26-13: wbc 9.1; hgb 9.6; hct 28.4; mcv 86.6; ;plt 192; glucose 131; bun 29; creat 2.60; k+3.6; na++127; urine culture: no growth 09-01-13: wbc 11.0; hgb 10.7; hct 30.2; mcv 84.4; plt 205; glucose 130;  bun 33; creat 2.46; k+3.6; na++118; liver normal albumin 3.4; urine culture: no growth. tsh 2.92; hgb a1c 5.5; urine na++24;  urine creat 52.99; osmolality: 259 09-03-13; glucose 97; bun 30;creat 2.7; k+3.5; na++127; cortisol 14.9  09-05-13: glucose 94; bun 29; creat 1.88; k+3.8; na++130;   09-17-13: glucose 174; bun 42; creat 2.2; k+4; na++131     REVIEW OF SYSTEMS  DATA OBTAINED: from patient, nurse, medical record GENERAL: Feels well  No recent fever, fatigue, change in activity status, appetite, or weight  RESPIRATORY: No cough, wheezing, SOB CARDIAC: No chest pain, palpitations. BLE edema R>L GI: No abdominal pain  No Nausea,vomiting,diarrhea or constipation  No heartburn or reflux  MUSCULOSKELETAL: right knee pain at times, right lower ext pain, gen weakness. Needs assistance activity uses a WC NEUROLOGIC: No dizziness, fainting, headache, numbness No change in mental status  PSYCHIATRIC: No feelings of anxiety, depression  Sleeps well   No behavior issue  PHYSICAL EXAM Filed Vitals:   09/17/13 1340  BP: 140/71  Pulse: 85  Temp: 98.8 F (37.1 C)  Resp: 19   There is no weight on file to calculate BMI. GENERAL APPEARANCE: No acute distress, appropriately groomed, normal body habitus. Alert, pleasant, conversant. SKIN: No diaphoresis, rash, unusual lesions. Small skin tear without drainage or surrounding erythema to the right achilles area. No skin tenting to the chest HEAD: Normocephalic, atraumatic NECK: Supple, full ROM. No thyroid tenderness, enlargement or nodule LYMPHATICS: No head, neck or supraclavicular adenopathy RESPIRATORY: Breathing is even, unlabored. Lung sounds are clear and full.  CHEST/BREASTS: No chest deformity. Breasts without tenderness, mass, discharge CARDIOVASCULAR: Heart RRR. No murmur or extra heart sounds  ARTERIAL: unable to palpate pedal pulses secondary to edema  EDEMA: +4 edema to RLE, +3 to the LLE. Warmth and redness to the RLE GASTROINTESTINAL: Abdomen is soft, non-tender, not distended w/ normal bowel sounds. No hepatic or splenic enlargement. Fairly large ventral hernia with tenderness, easily reduces. GENITOURINARY: Bladder non tender, not distended.   MUSCULOSKELETAL: Unable to walk. Gen weak. Strength 3/5 to upper ext  with decreased ROM to the shoulders. 2/5 to lower ext NEUROLOGIC: Oriented to person and situation. No focal deficit PSYCHIATRIC: Mood and affect appropriate to situation  ASSESSMENT/PLAN  Cellulitis of leg, right Doxycycline 100mg  p.o. For 10 days. Apply TED hose in the morning and remove in the evening. Check a CBC in the morning.   DM (diabetes mellitus) type II controlled with renal manifestation Elevated glucose on BMP this morning. Monitor CBGs daily due to current dx of cellulitis.  Dehydration with hyponatremia Worsening. He rBUn has increased to 42. She states that she is not drinking enough water. She is not really capable of remembering to push fluids. Will given NS at 60 cc/hr for 1 L and recheck her BMP in the morning. There has been difficulty gaining IV access so we will go with clysis for gentle hydration    Wyatt Portela NP/Christina Sherene Sires RN, MSN  09/17/2013

## 2013-09-17 NOTE — Assessment & Plan Note (Signed)
Elevated glucose on BMP this morning. Monitor CBGs daily due to current dx of cellulitis.

## 2013-09-17 NOTE — Assessment & Plan Note (Signed)
Doxycycline 100mg  p.o. For 10 days. Apply TED hose in the morning and remove in the evening. Check a CBC in the morning.

## 2013-09-18 ENCOUNTER — Non-Acute Institutional Stay (SKILLED_NURSING_FACILITY): Payer: Medicare Other | Admitting: Internal Medicine

## 2013-09-18 ENCOUNTER — Encounter: Payer: Self-pay | Admitting: Internal Medicine

## 2013-09-18 DIAGNOSIS — I5032 Chronic diastolic (congestive) heart failure: Secondary | ICD-10-CM

## 2013-09-18 DIAGNOSIS — R638 Other symptoms and signs concerning food and fluid intake: Secondary | ICD-10-CM

## 2013-09-18 DIAGNOSIS — D649 Anemia, unspecified: Secondary | ICD-10-CM

## 2013-09-18 DIAGNOSIS — L02419 Cutaneous abscess of limb, unspecified: Secondary | ICD-10-CM

## 2013-09-18 DIAGNOSIS — N184 Chronic kidney disease, stage 4 (severe): Secondary | ICD-10-CM

## 2013-09-18 DIAGNOSIS — R531 Weakness: Secondary | ICD-10-CM

## 2013-09-18 DIAGNOSIS — R609 Edema, unspecified: Secondary | ICD-10-CM

## 2013-09-18 DIAGNOSIS — L03119 Cellulitis of unspecified part of limb: Secondary | ICD-10-CM

## 2013-09-18 DIAGNOSIS — K5900 Constipation, unspecified: Secondary | ICD-10-CM

## 2013-09-18 DIAGNOSIS — M109 Gout, unspecified: Secondary | ICD-10-CM

## 2013-09-18 DIAGNOSIS — E871 Hypo-osmolality and hyponatremia: Secondary | ICD-10-CM

## 2013-09-18 DIAGNOSIS — R5383 Other fatigue: Secondary | ICD-10-CM

## 2013-09-18 DIAGNOSIS — R5381 Other malaise: Secondary | ICD-10-CM

## 2013-09-18 DIAGNOSIS — L03115 Cellulitis of right lower limb: Secondary | ICD-10-CM

## 2013-09-18 DIAGNOSIS — E1129 Type 2 diabetes mellitus with other diabetic kidney complication: Secondary | ICD-10-CM

## 2013-09-18 NOTE — Progress Notes (Signed)
Patient ID: KLYN KROENING, female   DOB: February 08, 1924, 80 y.o.   MRN: [id number]     Facility: [facility]    PCP: Laurena Slimmer, MD  Code Status: full code  Allergies  Allergen Reactions  . Prinivil [Lisinopril]     Pt reports unaware of allergy    Chief Complaint: new admit  HPI:  80 y/o female patient is here for STR after hospital admission from 09/01/13- 09/05/13 with dehydration and hyponatremia with generalized weakness. She has history of dementia which limits her history taking and obtaining review of system. She has history of chf, ckd stage 4, HTN, gait instability and chronic hyponatremia. No concerns from staff at present. She was started on doxycycline for 10 days yesterday for cellulitis in right leg and has been started on iv fluids @ 60 cc/hr for a litre this am. dvt in right leg has been ruled out by venous doppler.   Review of Systems: from patient and staff Constitutional: Negative for fever, chills, diaphoresis.  Respiratory: Negative for cough, sputum production, shortness of breath and wheezing.   Cardiovascular: Negative for chest pain, palpitations Gastrointestinal: Negative for heartburn, nausea, vomiting, abdominal pain, diarrhea  Genitourinary: Negative for dysuria Skin: Negative for itching and rash.  Neurological: Negative for dizziness. Has generalized weakness Psychiatric/Behavioral: The patient is not nervous/anxious.     Past Medical History  Diagnosis Date  . CAD (coronary artery disease)   . Gout   . Hyperlipemia   . Hypertension   . Diabetes mellitus without complication   . MI (myocardial infarction) August 1999    PCI RCA  . Anemia   . Diastolic heart failure   . Chronic kidney disease, stage IV (severe)   . Shortness of breath   . Arthritis     KNEES & BACK    Past Surgical History  Procedure Laterality Date  . Abdominal surgery    . Replacement total knee Left   . Total shoulder replacement Right     . Appendectomy    . Cholecystectomy    . Cardiac catheterization  1999    LAD OK, OM2 70%, RCA 90/80% with spont dissection, S/P PCI RCA   Social History:   reports that she has quit smoking. She has never used smokeless tobacco. She reports that she does not drink alcohol or use illicit drugs.  Family History  Problem Relation Age of Onset  . Arthritis Mother     Died at age 59  . Arthritis Sister     Medications: Patient's Medications  New Prescriptions   No medications on file  Previous Medications   ALLOPURINOL (ZYLOPRIM) 100 MG TABLET    Take 100 mg by mouth daily.   ASPIRIN EC 81 MG TABLET    Take 81 mg by mouth daily.   DOCUSATE SODIUM 100 MG CAPS    Take 100 mg by mouth 2 (two) times daily.   FEEDING SUPPLEMENT, ENSURE COMPLETE, (ENSURE COMPLETE) LIQD    Take 237 mLs by mouth 2 (two) times daily between meals.   LEVOTHYROXINE (SYNTHROID, LEVOTHROID) 50 MCG TABLET    Take 50 mcg by mouth daily before breakfast.   LUBIPROSTONE (AMITIZA) 8 MCG CAPSULE    Take 8 mcg by mouth 2 (two) times daily.    NYSTATIN-TRIAMCINOLONE (MYCOLOG II) CREAM    Apply 1 application topically daily as needed (antifungal).   Modified Medications   No medications on file  Discontinued Medications   No medications on file  Physical Exam: Filed Vitals:   09/18/13 1004  BP: 114/67  Pulse: 83  Temp: 98.7 F (37.1 C)  Resp: 18  SpO2: 95%    General- elderly female in no acute distress Head- atraumatic, normocephalic Eyes- PERRLA, EOMI, no pallor Neck- no lymphadenopathy Throat- moist mucus membrane, normal oropharynx Cardiovascular- normal s1,s2, no murmurs Respiratory- bilateral clear to auscultation, no wheeze, no rhonchi, no crackles, no use of accessory muscles Abdomen- bowel sounds present, soft, non tender Musculoskeletal- able to move all 4 extremities, has pitting leg edema with redness in right leg, weakness present left > right, temperature same in both legs Neurological-  no focal deficit Skin- warm and dry, skin tear in right ankle area Psychiatry- alert and oriented to person    Labs reviewed: Basic Metabolic Panel:  Recent Labs  16/12/9605/01/15 1347 09/03/13 2152 09/05/13 0525  NA 123* 123* 130*  K 3.5* 3.8 3.8  CL 87* 88* 93*  CO2 22 21 22   GLUCOSE 97 93 94  BUN 30* 31* 29*  CREATININE 2.07* 1.95* 1.88*  CALCIUM 8.2* 8.4 8.7   Liver Function Tests:  Recent Labs  09/01/13 1357  AST 16  ALT 7  ALKPHOS 68  BILITOT 0.7  PROT 6.5  ALBUMIN 3.4*   No results found for this basename: LIPASE, AMYLASE,  in the last 8760 hours No results found for this basename: AMMONIA,  in the last 8760 hours CBC:  Recent Labs  08/26/13 1652 09/01/13 1357 09/02/13 1034  WBC 9.1 11.0* 10.3  HGB 9.6* 10.7* 10.1*  HCT 28.4* 30.2* 28.9*  MCV 86.6 84.4 86.0  PLT 192 205 188   Cardiac Enzymes:  Recent Labs  09/01/13 1357  TROPONINI <0.30   BNP: No components found with this basename: POCBNP,  CBG:  Recent Labs  09/04/13 2002 09/05/13 0727 09/05/13 1159  GLUCAP 97 90 120*   Lab Results  Component Value Date   HGBA1C 5.5 09/01/2013   Lab Results  Component Value Date   TSH 2.920 09/01/2013    09/17/13 bun 42, cr 2.2, na 131, k 4  Radiological Exams: 629-15: chest x-ray: No acute abnormality.  09-01-13: right knee x-ray: Advanced tricompartmental degenerative changes and moderate-sized joint effusion.  09-01-13: ct of head: No acute intracranial pathology.  09-02-13: TEE: Left ventricle: The cavity size was normal. There was mild concentric hypertrophy. Systolic function was normal. The estimated ejection fraction was in the range of 55% to 60%. Wall motion was normal; there were no regional wall motion abnormalities. Doppler parameters are consistent with abnormal left ventricular relaxation (grade 1 diastolic dysfunction). Doppler parameters are consistent with elevated ventricular end-diastolic filling pressure. - Aortic valve:  Trileaflet; moderately thickened, moderately calcified leaflets. Valve mobility was restricted. There was mild stenosis. There was mild regurgitation.   - Mitral valve: Structurally normal valve. - Right ventricle: The cavity size was mildly dilated. Wall thickness was normal. Systolic function was mildly reduced. - Right atrium: The atrium was normal in size. - Tricuspid valve: There was trivial regurgitation. - Pulmonary arteries: Systolic pressure was within the normal range.  09-04-13: renal ultrasound: Echogenic kidneys suggest medical renal disease without obstructive uropathy.   09-04-13: bilateral lower extremity dopplers: No evidence of deep vein or superficial thrombosis involving the right lower extremity and left lower extremity.  Assessment/Plan  Generalized weakness Will have patient work with PT/OT as tolerated to regain strength and restore function.  Fall precautions are in place.  Anemia  Appears to be chronic. Her ckd likely  contributing to this. Will check her erythropoetin level with he rnext lab to assess for need of aransep that might help with her overall weakness  Hyponatremia Chronic. On iv fluids at present at a low rate. Encourage po fluid intake. Will start her on salt tablet 1 g daily for now   CKD Monitor renal function. Avoid NSAIDs.  Cellulitis Complete course of doxycycline for now. Monitor temp and wbc curve  Edema Her chf and ckd could mainly contributing some along with stasis and cellulitis. To keep legs elevated at rest. D/c ted hose order for now with her being treated for infection. Check prealbumin to assess for protein losing causes. Diurese as below  Dementia Persists with decreased po intake. Decline anticipated. Skin care, fall precautions for now  Poor po intake Her dementia, deconditioning could all be contributing to this. Will add remeron 7.5 mg daily for now to see if this helps stimulate her mood and appetite. Encourage po  intake  Chronic diastolic heart failure Has gained weight from 177.8 lb on admission to 185.7 kb today. Check weight. Off all cardiac meds at present. Will have her started on torsemide 20 mg x1 now to help with diuresis and recheck her weight in am and assess for further dosing  Hypothyroidism Reviewed recent tsh, stable, continue synthroid 50 mcg daily   Diabetes a1c is 5.5. Off all meds, monitor cbg once a week    Gout no recent flare. continue allopurinol 100 mg daily  Constipation continue colace and amitiza. No diarrhea   Family/ staff Communication: reviewed care plan with patient and nursing supervisor  Goals of care: short term rehabilitation   Labs/tests ordered: cbc with diff, cmp, erytrhopoetin and prealbumin level    Oneal Grout, MD  [facility] [phone] (Monday-Friday 8 am - 5 pm) [phone] (afterhours)

## 2013-09-19 LAB — CBC AND DIFFERENTIAL
HEMATOCRIT: 27 % — AB (ref 36–46)
Hemoglobin: 8.5 g/dL — AB (ref 12.0–16.0)
Platelets: 242 10*3/uL (ref 150–399)
WBC: 8 10^3/mL

## 2013-09-19 LAB — BASIC METABOLIC PANEL
BUN: 36 mg/dL — AB (ref 4–21)
Creatinine: 2.4 mg/dL — AB (ref 0.5–1.1)
Glucose: 153 mg/dL
POTASSIUM: 4.8 mmol/L (ref 3.4–5.3)
Sodium: 139 mmol/L (ref 137–147)

## 2013-09-19 LAB — HEPATIC FUNCTION PANEL
ALK PHOS: 96 U/L (ref 25–125)
ALT: 11 U/L (ref 7–35)
AST: 15 U/L (ref 13–35)

## 2013-09-22 ENCOUNTER — Non-Acute Institutional Stay (SKILLED_NURSING_FACILITY): Payer: Medicare Other | Admitting: Adult Health

## 2013-09-22 DIAGNOSIS — L03119 Cellulitis of unspecified part of limb: Secondary | ICD-10-CM

## 2013-09-22 DIAGNOSIS — R5381 Other malaise: Secondary | ICD-10-CM

## 2013-09-22 DIAGNOSIS — E1129 Type 2 diabetes mellitus with other diabetic kidney complication: Secondary | ICD-10-CM

## 2013-09-22 DIAGNOSIS — I5032 Chronic diastolic (congestive) heart failure: Secondary | ICD-10-CM

## 2013-09-22 DIAGNOSIS — L03115 Cellulitis of right lower limb: Secondary | ICD-10-CM

## 2013-09-22 DIAGNOSIS — N184 Chronic kidney disease, stage 4 (severe): Secondary | ICD-10-CM

## 2013-09-22 DIAGNOSIS — L02419 Cutaneous abscess of limb, unspecified: Secondary | ICD-10-CM

## 2013-09-23 NOTE — Progress Notes (Signed)
Patient ID: [name] [name], female   DOB: 10/27/1923, 80 y.o.   MRN: [id number]     [facility]  Allergies  Allergen Reactions  . Prinivil [Lisinopril]     Pt reports unaware of allergy     Chief Complaint  Patient presents with  . Discharge Note    HPI:  She is being discharged to home with home health for pt/ot in order to improve upon her mobility; strength and improve upon her level of independence with her adl's. She will need a home health aid to assist with adls care. She will need a wheelchair in order for her to maintain her current level of independence with her adls and will need a 3:1 commode. She would benefit from a transfer stand frame. She will need a 30 day supply of her medications as well.  She had been hospitalized for diastolic heart failure.    Past Medical History  Diagnosis Date  . CAD (coronary artery disease)   . Gout   . Hyperlipemia   . Hypertension   . Diabetes mellitus without complication   . MI (myocardial infarction) August 1999    PCI RCA  . Anemia   . Diastolic heart failure   . Chronic kidney disease, stage IV (severe)   . Shortness of breath   . Arthritis     KNEES & BACK     Past Surgical History  Procedure Laterality Date  . Abdominal surgery    . Replacement total knee Left   . Total shoulder replacement Right   . Appendectomy    . Cholecystectomy    . Cardiac catheterization  1999    LAD OK, OM2 70%, RCA 90/80% with spont dissection, S/P PCI RCA    VITAL SIGNS BP 122/78  Pulse 91  Ht 5\' 3"  (1.6 m)  Wt 179 lb 9.6 oz (81.466 kg)  BMI 31.82 kg/m2  SpO2 97%   Patient's Medications  New Prescriptions   No medications on file  Previous Medications   ALLOPURINOL (ZYLOPRIM) 100 MG TABLET    Take 100 mg by mouth daily.   ASPIRIN EC 81 MG TABLET    Take 81 mg by mouth daily.   DOCUSATE SODIUM 100 MG CAPS    Take 100 mg by mouth 2 (two) times daily.   FEEDING SUPPLEMENT, ENSURE COMPLETE, (ENSURE COMPLETE) LIQD    Take  237 mLs by mouth 2 (two) times daily between meals.   LEVOTHYROXINE (SYNTHROID, LEVOTHROID) 50 MCG TABLET    Take 50 mcg by mouth daily before breakfast.   LUBIPROSTONE (AMITIZA) 8 MCG CAPSULE    Take 8 mcg by mouth 2 (two) times daily.    NYSTATIN-TRIAMCINOLONE (MYCOLOG II) CREAM    Apply 1 application topically daily as needed (antifungal).   Modified Medications   No medications on file  Discontinued Medications   No medications on file    SIGNIFICANT DIAGNOSTIC EXAMS   09-01-13: chest x-ray: No acute abnormality.  09-01-13: right knee x-ray: Advanced tricompartmental degenerative changes and moderate-sized joint effusion.  09-01-13: ct of head: No acute intracranial pathology.  09-02-13: TEE: Left ventricle: The cavity size was normal. There was mild concentric hypertrophy. Systolic function was normal. The estimated ejection fraction was in the range of 55% to 60%. Wall motion was normal; there were no regional wall motion abnormalities. Doppler parameters are consistent with abnormal left ventricular relaxation (grade 1 diastolic dysfunction). Doppler parameters are consistent with elevated ventricular end-diastolic filling pressure. - Aortic valve: Trileaflet;  moderately thickened, moderately calcified leaflets. Valve mobility was restricted. There was mild stenosis. There was mild regurgitation.  - Mitral valve: Structurally normal valve. - Right ventricle: The cavity size was mildly dilated. Wall thickness was normal. Systolic function was mildly reduced. - Right atrium: The atrium was normal in size. - Tricuspid valve: There was trivial regurgitation. - Pulmonary arteries: Systolic pressure was within the normal range.  09-04-13: renal ultrasound: Echogenic kidneys suggest medical renal disease without obstructive uropathy.   09-04-13: bilateral lower extremity dopplers: No evidence of deep vein or superficial thrombosis involving the right lower extremity and left lower  extremity.   LABS REVIEWED:   08-26-13: wbc 9.1; hgb 9.6; hct 28.4; mcv 86.6; ;plt 192; glucose 131; bun 29; creat 2.60; k+3.6; na++127; urine culture: no growth 09-01-13: wbc 11.0; hgb 10.7; hct 30.2; mcv 84.4; plt 205; glucose 130; bun 33; creat 2.46; k+3.6; na++118; liver normal albumin 3.4; urine culture: no growth. tsh 2.92; hgb a1c 5.5; urine na++24;  urine creat 52.99; osmolality: 259 09-03-13; glucose 97; bun 30;creat 2.7; k+3.5; na++127; cortisol 14.9  09-05-13: glucose 94; bun 29; creat 1.88; k+3.8; na++130;  09-17-13: glucose 174; bun 42; creat 2.2; k+4; na++131  09-19-13: wbc 8.0; hgb 8.5; hct 26.7; mcv 92.4; plt 242; glucose 153; bun 36; creat 2.4; k+4.8; na++139 liver normal albumin 2.9 pre-albumin 11.01    Review of Systems  Constitutional: Negative for malaise/fatigue.  Respiratory: Negative for cough and shortness of breath.   Cardiovascular: Negative for chest pain.  Gastrointestinal: Negative for heartburn and abdominal pain.  Musculoskeletal: Negative for joint pain and myalgias.  Psychiatric/Behavioral: The patient is not nervous/anxious.     General- elderly female in no acute distress Neck- no lymphadenopathy Cardiovascular- normal s1,s2, no murmurs Respiratory- bilateral clear to auscultation, no wheeze,  no use of accessory muscles Abdomen- bowel sounds present, soft, non tender Musculoskeletal- able to move all 4 extremities, has pitting leg edema with redness in right leg, weakness present left > right, temperature same in both legs Neurological- no focal deficit Skin- warm and dry, skin tear in right ankle area Psychiatry- alert and oriented to person     ASSESSMENT/ PLAN:  Will discharge her to home with home health for pt/ot/aid. She will need a wheelchair and a 3:1 commode and a transfer stand frame. She has a follow up appointment with Dr. Margaretmary Bayley (pcp) on 10-02-13 at 2 pm. She has written a 30 days supply of her medications.     Time spent with  patient 45 minutes.       Synthia Innocent NP [facility]  Contact [phone] Monday through Friday 8am- 5pm  After hours call [phone]

## 2015-04-01 ENCOUNTER — Emergency Department (HOSPITAL_COMMUNITY): Payer: Medicare Other

## 2015-04-01 ENCOUNTER — Encounter (HOSPITAL_COMMUNITY): Payer: Self-pay | Admitting: Emergency Medicine

## 2015-04-01 ENCOUNTER — Observation Stay (HOSPITAL_COMMUNITY): Payer: Medicare Other

## 2015-04-01 DIAGNOSIS — E875 Hyperkalemia: Secondary | ICD-10-CM | POA: Diagnosis not present

## 2015-04-01 DIAGNOSIS — N289 Disorder of kidney and ureter, unspecified: Secondary | ICD-10-CM

## 2015-04-01 DIAGNOSIS — I214 Non-ST elevation (NSTEMI) myocardial infarction: Principal | ICD-10-CM | POA: Insufficient documentation

## 2015-04-01 DIAGNOSIS — W19XXXA Unspecified fall, initial encounter: Secondary | ICD-10-CM | POA: Diagnosis present

## 2015-04-01 DIAGNOSIS — I5043 Acute on chronic combined systolic (congestive) and diastolic (congestive) heart failure: Secondary | ICD-10-CM | POA: Diagnosis present

## 2015-04-01 DIAGNOSIS — Z794 Long term (current) use of insulin: Secondary | ICD-10-CM

## 2015-04-01 DIAGNOSIS — M1990 Unspecified osteoarthritis, unspecified site: Secondary | ICD-10-CM | POA: Diagnosis present

## 2015-04-01 DIAGNOSIS — R627 Adult failure to thrive: Secondary | ICD-10-CM | POA: Diagnosis present

## 2015-04-01 DIAGNOSIS — E1122 Type 2 diabetes mellitus with diabetic chronic kidney disease: Secondary | ICD-10-CM | POA: Diagnosis present

## 2015-04-01 DIAGNOSIS — E1129 Type 2 diabetes mellitus with other diabetic kidney complication: Secondary | ICD-10-CM | POA: Diagnosis present

## 2015-04-01 DIAGNOSIS — Z96652 Presence of left artificial knee joint: Secondary | ICD-10-CM | POA: Diagnosis present

## 2015-04-01 DIAGNOSIS — I5032 Chronic diastolic (congestive) heart failure: Secondary | ICD-10-CM | POA: Diagnosis present

## 2015-04-01 DIAGNOSIS — M1711 Unilateral primary osteoarthritis, right knee: Secondary | ICD-10-CM | POA: Diagnosis present

## 2015-04-01 DIAGNOSIS — R778 Other specified abnormalities of plasma proteins: Secondary | ICD-10-CM | POA: Diagnosis present

## 2015-04-01 DIAGNOSIS — I252 Old myocardial infarction: Secondary | ICD-10-CM

## 2015-04-01 DIAGNOSIS — R339 Retention of urine, unspecified: Secondary | ICD-10-CM | POA: Diagnosis not present

## 2015-04-01 DIAGNOSIS — N179 Acute kidney failure, unspecified: Secondary | ICD-10-CM | POA: Diagnosis present

## 2015-04-01 DIAGNOSIS — I4581 Long QT syndrome: Secondary | ICD-10-CM | POA: Diagnosis present

## 2015-04-01 DIAGNOSIS — L8992 Pressure ulcer of unspecified site, stage 2: Secondary | ICD-10-CM | POA: Diagnosis present

## 2015-04-01 DIAGNOSIS — E872 Acidosis, unspecified: Secondary | ICD-10-CM | POA: Diagnosis present

## 2015-04-01 DIAGNOSIS — K5900 Constipation, unspecified: Secondary | ICD-10-CM | POA: Diagnosis present

## 2015-04-01 DIAGNOSIS — Z66 Do not resuscitate: Secondary | ICD-10-CM | POA: Diagnosis present

## 2015-04-01 DIAGNOSIS — N189 Chronic kidney disease, unspecified: Secondary | ICD-10-CM

## 2015-04-01 DIAGNOSIS — D696 Thrombocytopenia, unspecified: Secondary | ICD-10-CM | POA: Diagnosis present

## 2015-04-01 DIAGNOSIS — D72829 Elevated white blood cell count, unspecified: Secondary | ICD-10-CM | POA: Diagnosis present

## 2015-04-01 DIAGNOSIS — I2510 Atherosclerotic heart disease of native coronary artery without angina pectoris: Secondary | ICD-10-CM | POA: Diagnosis present

## 2015-04-01 DIAGNOSIS — I5041 Acute combined systolic (congestive) and diastolic (congestive) heart failure: Secondary | ICD-10-CM | POA: Insufficient documentation

## 2015-04-01 DIAGNOSIS — Z96611 Presence of right artificial shoulder joint: Secondary | ICD-10-CM | POA: Diagnosis present

## 2015-04-01 DIAGNOSIS — I429 Cardiomyopathy, unspecified: Secondary | ICD-10-CM | POA: Diagnosis present

## 2015-04-01 DIAGNOSIS — R296 Repeated falls: Secondary | ICD-10-CM | POA: Diagnosis present

## 2015-04-01 DIAGNOSIS — R531 Weakness: Secondary | ICD-10-CM

## 2015-04-01 DIAGNOSIS — E785 Hyperlipidemia, unspecified: Secondary | ICD-10-CM | POA: Diagnosis present

## 2015-04-01 DIAGNOSIS — Z515 Encounter for palliative care: Secondary | ICD-10-CM | POA: Insufficient documentation

## 2015-04-01 DIAGNOSIS — S0181XA Laceration without foreign body of other part of head, initial encounter: Secondary | ICD-10-CM | POA: Diagnosis present

## 2015-04-01 DIAGNOSIS — F0390 Unspecified dementia without behavioral disturbance: Secondary | ICD-10-CM | POA: Diagnosis present

## 2015-04-01 DIAGNOSIS — Z87891 Personal history of nicotine dependence: Secondary | ICD-10-CM

## 2015-04-01 DIAGNOSIS — N184 Chronic kidney disease, stage 4 (severe): Secondary | ICD-10-CM

## 2015-04-01 DIAGNOSIS — I130 Hypertensive heart and chronic kidney disease with heart failure and stage 1 through stage 4 chronic kidney disease, or unspecified chronic kidney disease: Secondary | ICD-10-CM | POA: Diagnosis present

## 2015-04-01 DIAGNOSIS — R7989 Other specified abnormal findings of blood chemistry: Secondary | ICD-10-CM | POA: Diagnosis present

## 2015-04-01 DIAGNOSIS — I10 Essential (primary) hypertension: Secondary | ICD-10-CM | POA: Diagnosis present

## 2015-04-01 DIAGNOSIS — L8990 Pressure ulcer of unspecified site, unspecified stage: Secondary | ICD-10-CM | POA: Insufficient documentation

## 2015-04-01 DIAGNOSIS — Z7982 Long term (current) use of aspirin: Secondary | ICD-10-CM

## 2015-04-01 DIAGNOSIS — Z955 Presence of coronary angioplasty implant and graft: Secondary | ICD-10-CM

## 2015-04-01 DIAGNOSIS — D649 Anemia, unspecified: Secondary | ICD-10-CM | POA: Insufficient documentation

## 2015-04-01 DIAGNOSIS — J9601 Acute respiratory failure with hypoxia: Secondary | ICD-10-CM | POA: Diagnosis not present

## 2015-04-01 DIAGNOSIS — I350 Nonrheumatic aortic (valve) stenosis: Secondary | ICD-10-CM | POA: Diagnosis present

## 2015-04-01 DIAGNOSIS — E860 Dehydration: Secondary | ICD-10-CM | POA: Diagnosis present

## 2015-04-01 DIAGNOSIS — R739 Hyperglycemia, unspecified: Secondary | ICD-10-CM | POA: Insufficient documentation

## 2015-04-01 DIAGNOSIS — M6282 Rhabdomyolysis: Secondary | ICD-10-CM | POA: Diagnosis present

## 2015-04-01 DIAGNOSIS — R0682 Tachypnea, not elsewhere classified: Secondary | ICD-10-CM | POA: Diagnosis present

## 2015-04-01 DIAGNOSIS — L89152 Pressure ulcer of sacral region, stage 2: Secondary | ICD-10-CM | POA: Diagnosis present

## 2015-04-01 DIAGNOSIS — E039 Hypothyroidism, unspecified: Secondary | ICD-10-CM | POA: Diagnosis present

## 2015-04-01 DIAGNOSIS — I110 Hypertensive heart disease with heart failure: Secondary | ICD-10-CM | POA: Insufficient documentation

## 2015-04-01 HISTORY — DX: Unspecified dementia, unspecified severity, without behavioral disturbance, psychotic disturbance, mood disturbance, and anxiety: F03.90

## 2015-04-01 LAB — CBC WITH DIFFERENTIAL/PLATELET
Basophils Absolute: 0 10*3/uL (ref 0.0–0.1)
Basophils Relative: 0 %
Eosinophils Absolute: 0 10*3/uL (ref 0.0–0.7)
Eosinophils Relative: 0 %
HCT: 34.1 % — ABNORMAL LOW (ref 36.0–46.0)
Hemoglobin: 11 g/dL — ABNORMAL LOW (ref 12.0–15.0)
Lymphocytes Relative: 16 %
Lymphs Abs: 2.4 10*3/uL (ref 0.7–4.0)
MCH: 28.6 pg (ref 26.0–34.0)
MCHC: 32.3 g/dL (ref 30.0–36.0)
MCV: 88.6 fL (ref 78.0–100.0)
Monocytes Absolute: 0.8 10*3/uL (ref 0.1–1.0)
Monocytes Relative: 5 %
Neutro Abs: 12.2 10*3/uL — ABNORMAL HIGH (ref 1.7–7.7)
Neutrophils Relative %: 79 %
Platelets: 156 10*3/uL (ref 150–400)
RBC: 3.85 MIL/uL — ABNORMAL LOW (ref 3.87–5.11)
RDW: 15.1 % (ref 11.5–15.5)
WBC: 15.4 10*3/uL — ABNORMAL HIGH (ref 4.0–10.5)

## 2015-04-01 LAB — BASIC METABOLIC PANEL
Anion gap: 17 — ABNORMAL HIGH (ref 5–15)
BUN: 53 mg/dL — ABNORMAL HIGH (ref 6–20)
CO2: 16 mmol/L — ABNORMAL LOW (ref 22–32)
Calcium: 9.4 mg/dL (ref 8.9–10.3)
Chloride: 106 mmol/L (ref 101–111)
Creatinine, Ser: 3.8 mg/dL — ABNORMAL HIGH (ref 0.44–1.00)
GFR calc Af Amer: 11 mL/min — ABNORMAL LOW (ref >60)
GFR calc non Af Amer: 10 mL/min — ABNORMAL LOW (ref >60)
Glucose, Bld: 175 mg/dL — ABNORMAL HIGH (ref 65–99)
Potassium: 5.3 mmol/L — ABNORMAL HIGH (ref 3.5–5.1)
Sodium: 139 mmol/L (ref 135–145)

## 2015-04-01 LAB — GLUCOSE, CAPILLARY
Glucose-Capillary: 139 mg/dL — ABNORMAL HIGH (ref 65–99)
Glucose-Capillary: 144 mg/dL — ABNORMAL HIGH (ref 65–99)

## 2015-04-01 LAB — RETICULOCYTES
RBC.: 3.7 MIL/uL — ABNORMAL LOW (ref 3.87–5.11)
RETIC CT PCT: 1.8 % (ref 0.4–3.1)
Retic Count, Absolute: 66.6 10*3/uL (ref 19.0–186.0)

## 2015-04-01 LAB — I-STAT TROPONIN, ED: TROPONIN I, POC: 26.87 ng/mL — AB (ref 0.00–0.08)

## 2015-04-01 LAB — CK: Total CK: 2000 U/L — ABNORMAL HIGH (ref 38–234)

## 2015-04-01 MED ORDER — ACETAMINOPHEN 650 MG RE SUPP: 650.0000 mg | Freq: Four times a day (QID) | RECTAL | Status: DC | PRN

## 2015-04-01 MED ORDER — ASPIRIN 325 MG PO TABS
325.0000 mg | ORAL_TABLET | Freq: Every day | ORAL | Status: DC
  Administered 2015-04-02 – 2015-04-04 (×3): 325 mg via ORAL
  Filled 2015-04-01 (×3): qty 1

## 2015-04-01 MED ORDER — HEPARIN SODIUM (PORCINE) 5000 UNIT/ML IJ SOLN: 5000.0000 [IU] | Freq: Three times a day (TID) | INTRAMUSCULAR | Status: DC

## 2015-04-01 MED ORDER — SODIUM CHLORIDE 0.9 % IV BOLUS (SEPSIS)
1000.0000 mL | Freq: Once | INTRAVENOUS | Status: AC
  Administered 2015-04-01: 1000 mL via INTRAVENOUS

## 2015-04-01 MED ORDER — SODIUM CHLORIDE 0.9% FLUSH
3.0000 mL | Freq: Two times a day (BID) | INTRAVENOUS | Status: DC
  Administered 2015-04-03 – 2015-04-05 (×3): 3 mL via INTRAVENOUS

## 2015-04-01 MED ORDER — ONDANSETRON HCL 4 MG PO TABS: 4.0000 mg | ORAL_TABLET | Freq: Four times a day (QID) | ORAL | Status: DC | PRN

## 2015-04-01 MED ORDER — ACETAMINOPHEN 325 MG PO TABS
650.0000 mg | ORAL_TABLET | Freq: Four times a day (QID) | ORAL | Status: DC | PRN
  Filled 2015-04-01: qty 2

## 2015-04-01 MED ORDER — ALLOPURINOL 100 MG PO TABS
100.0000 mg | ORAL_TABLET | Freq: Every day | ORAL | Status: DC
  Administered 2015-04-02 – 2015-04-04 (×3): 100 mg via ORAL
  Filled 2015-04-01 (×3): qty 1

## 2015-04-01 MED ORDER — NYSTATIN-TRIAMCINOLONE 100000-0.1 UNIT/GM-% EX CREA: 1.0000 "application " | TOPICAL_CREAM | Freq: Every day | CUTANEOUS | Status: DC | PRN

## 2015-04-01 MED ORDER — HYDROCODONE-ACETAMINOPHEN 5-325 MG PO TABS: 1.0000 | ORAL_TABLET | ORAL | Status: DC | PRN

## 2015-04-01 MED ORDER — ONDANSETRON HCL 4 MG/2ML IJ SOLN: 4.0000 mg | Freq: Four times a day (QID) | INTRAMUSCULAR | Status: DC | PRN

## 2015-04-01 MED ORDER — TRAZODONE HCL 50 MG PO TABS
25.0000 mg | ORAL_TABLET | Freq: Every evening | ORAL | Status: DC | PRN
  Administered 2015-04-02 – 2015-04-04 (×2): 25 mg via ORAL
  Filled 2015-04-01 (×3): qty 1

## 2015-04-01 MED ORDER — LEVOTHYROXINE SODIUM 50 MCG PO TABS
50.0000 ug | ORAL_TABLET | Freq: Every day | ORAL | Status: DC
  Administered 2015-04-02 – 2015-04-04 (×3): 50 ug via ORAL
  Filled 2015-04-01 (×3): qty 1

## 2015-04-01 MED ORDER — LIDOCAINE HCL 2 % IJ SOLN
20.0000 mL | Freq: Once | INTRAMUSCULAR | Status: DC
  Filled 2015-04-01: qty 20

## 2015-04-01 MED ORDER — MORPHINE SULFATE (PF) 2 MG/ML IV SOLN: 1.0000 mg | INTRAVENOUS | Status: DC | PRN

## 2015-04-01 MED ORDER — LUBIPROSTONE 8 MCG PO CAPS
8.0000 ug | ORAL_CAPSULE | Freq: Two times a day (BID) | ORAL | Status: DC
  Administered 2015-04-01 – 2015-04-04 (×6): 8 ug via ORAL
  Filled 2015-04-01 (×10): qty 1

## 2015-04-01 MED ORDER — DOCUSATE SODIUM 100 MG PO CAPS
100.0000 mg | ORAL_CAPSULE | Freq: Two times a day (BID) | ORAL | Status: DC
  Administered 2015-04-01 – 2015-04-04 (×6): 100 mg via ORAL
  Filled 2015-04-01 (×7): qty 1

## 2015-04-01 MED ORDER — ATORVASTATIN CALCIUM 40 MG PO TABS
40.0000 mg | ORAL_TABLET | Freq: Every day | ORAL | Status: DC
  Administered 2015-04-02 – 2015-04-03 (×2): 40 mg via ORAL
  Filled 2015-04-01 (×3): qty 1

## 2015-04-01 MED ORDER — HEPARIN (PORCINE) IN NACL 100-0.45 UNIT/ML-% IJ SOLN
1200.0000 [IU]/h | INTRAMUSCULAR | Status: DC
  Administered 2015-04-01: 850 [IU]/h via INTRAVENOUS
  Filled 2015-04-01 (×2): qty 250

## 2015-04-01 MED ORDER — ASPIRIN EC 81 MG PO TBEC: 81.0000 mg | DELAYED_RELEASE_TABLET | Freq: Every day | ORAL | Status: DC

## 2015-04-01 MED ORDER — MILK AND MOLASSES ENEMA
1.0000 | Freq: Once | RECTAL | Status: AC
  Administered 2015-04-02: 250 mL via RECTAL
  Filled 2015-04-01: qty 250

## 2015-04-01 MED ORDER — SODIUM CHLORIDE 0.45 % IV SOLN: INTRAVENOUS | Status: DC

## 2015-04-01 MED ORDER — INSULIN ASPART 100 UNIT/ML ~~LOC~~ SOLN
0.0000 [IU] | Freq: Every day | SUBCUTANEOUS | Status: DC
Start: 2015-04-01 — End: 2015-04-04

## 2015-04-01 MED ORDER — CARVEDILOL 6.25 MG PO TABS
6.2500 mg | ORAL_TABLET | Freq: Two times a day (BID) | ORAL | Status: DC
  Administered 2015-04-02 (×2): 6.25 mg via ORAL
  Filled 2015-04-01 (×2): qty 1

## 2015-04-01 MED ORDER — INSULIN ASPART 100 UNIT/ML ~~LOC~~ SOLN
0.0000 [IU] | Freq: Three times a day (TID) | SUBCUTANEOUS | Status: DC
  Administered 2015-04-02 – 2015-04-03 (×3): 1 [IU] via SUBCUTANEOUS

## 2015-04-01 MED ORDER — HEPARIN BOLUS VIA INFUSION
2000.0000 [IU] | Freq: Once | INTRAVENOUS | Status: AC
  Administered 2015-04-01: 2000 [IU] via INTRAVENOUS
  Filled 2015-04-01: qty 2000

## 2015-04-01 NOTE — ED Notes (Signed)
Hassie Torain -- Neighbor -- [phone]-- please call when she is discharged.

## 2015-04-01 NOTE — ED Notes (Signed)
Performed orthostatic vitals on patient; on the standing set patient "didn't feel like standing"; patient stated she's "tired and just wants to rest"

## 2015-04-01 NOTE — Consult Note (Signed)
ANTICOAGULATION CONSULT NOTE - Initial Consult  Pharmacy Consult for Heparin Indication: chest pain/ACS  Allergies  Allergen Reactions  . Prinivil [Lisinopril]     Pt reports unaware of allergy    Patient Measurements: Height: 5' 2.99" (160 cm) Weight: 170 lb (77.111 kg) IBW/kg (Calculated) : 52.38 Heparin Dosing Weight: ~69kg  Vital Signs: Temp: 97.4 F (36.3 C) (01/26 1312) Temp Source: Oral (01/26 1312) BP: 124/72 mmHg (01/26 1631) Pulse Rate: 72 (01/26 1631)  Labs:  Recent Labs  03/13/2015 1518 03/17/2015 1641  HGB 11.0*  --   HCT 34.1*  --   PLT 156  --   CREATININE 3.80*  --   CKTOTAL  --  2000*    Estimated Creatinine Clearance: 9.9 mL/min (by C-G formula based on Cr of 3.8).   Medical History: Past Medical History  Diagnosis Date  . CAD (coronary artery disease)   . Gout   . Hyperlipemia   . Hypertension   . Diabetes mellitus without complication (HCC)   . MI (myocardial infarction) [facility]) August 1999    PCI RCA  . Anemia   . Diastolic heart failure (HCC)   . Chronic kidney disease, stage IV (severe) (HCC)   . Shortness of breath   . Arthritis     KNEES & BACK   . Dementia     Medications:  No anticoagulants pta  Assessment: 89yof presented to the ED s/p fall. She was noted to have an abnormal EKG and troponin of 26. She will begin IV heparin. ARF on CKD noted with sCr 3.8, CrCl ~28ml/min. CBC wnl.  Goal of Therapy:  Heparin level 0.3-0.7 units/ml Monitor platelets by anticoagulation protocol: Yes   Plan:  1) Heparin bolus 2000 units x 1 2) Heparin drip at 850 units/hr 3) Check 8 hour heparin level 4) Daily heparin level and CBC  Fredrik Rigger 03/17/2015,5:58 PM

## 2015-04-01 NOTE — ED Provider Notes (Signed)
CSN: [id number]     Arrival date & time 05-01-2015  1304 History   First MD Initiated Contact with Patient May 01, 2015 1308     Chief Complaint  Patient presents with  . Fall     (Consider location/radiation/quality/duration/timing/severity/associated sxs/prior Treatment) HPI [name] [name] is a 82 year old female with a significant cardiac history who presents to the ED today after an unwitnessed fall at home. Patient says she has been falling often now perhaps 2x/wk. Per nurse, patient fell around 7-10pm and refused EMS transport to ED. She then fell again around midnight. Patient remembers this fall. She says she was attempting to close the blinds, grabbed a chair, and then fell and hit her forehead. She remained down for an unknown period of time. Patient claims she did not lose consciousness. She says neighbor called 911, but she does not remember how to neighbor found out about her fall. [name] [name] does not recall hitting any other extremity and she denies new onset weakness since fall. She denies HA, nausea, vomiting and endorses dizziness.  Of note patient fell last week and hit her right knee. She denies head trauma during previous fall, but refuses to move R knee due to pain.  Past Medical History  Diagnosis Date  . CAD (coronary artery disease)   . Gout   . Hyperlipemia   . Hypertension   . Diabetes mellitus without complication (HCC)   . MI (myocardial infarction) [facility]) August 1999    PCI RCA  . Anemia   . Diastolic heart failure (HCC)   . Chronic kidney disease, stage IV (severe) (HCC)   . Shortness of breath   . Arthritis     KNEES & BACK    Past Surgical History  Procedure Laterality Date  . Abdominal surgery    . Replacement total knee Left   . Total shoulder replacement Right   . Appendectomy    . Cholecystectomy    . Cardiac catheterization  1999    LAD OK, OM2 70%, RCA 90/80% with spont dissection, S/P PCI RCA   Family History  Problem Relation Age of Onset    . Arthritis Mother     Died at age 67  . Arthritis Sister    Social History  Substance Use Topics  . Smoking status: Former Games developer  . Smokeless tobacco: Never Used  . Alcohol Use: No   OB History    No data available     Review of Systems  Constitutional: Negative for fever, chills and diaphoresis.  Respiratory: Positive for cough. Negative for shortness of breath.   Cardiovascular: Negative for chest pain and palpitations.  Gastrointestinal: Negative for nausea, vomiting, abdominal pain and diarrhea.  Musculoskeletal: Positive for gait problem.  Neurological: Positive for dizziness. Negative for syncope, weakness, light-headedness, numbness and headaches.  Psychiatric/Behavioral: Positive for confusion.      Allergies  Prinivil  Home Medications   Prior to Admission medications   Medication Sig Start Date End Date Taking? Authorizing Provider  allopurinol (ZYLOPRIM) 100 MG tablet Take 100 mg by mouth daily.    Historical Provider, MD  aspirin EC 81 MG tablet Take 81 mg by mouth daily.    Historical Provider, MD  docusate sodium 100 MG CAPS Take 100 mg by mouth 2 (two) times daily. 09/05/13   Renae Fickle, MD  feeding supplement, ENSURE COMPLETE, (ENSURE COMPLETE) LIQD Take 237 mLs by mouth 2 (two) times daily between meals. 09/05/13   Renae Fickle, MD  levothyroxine (SYNTHROID, LEVOTHROID)  50 MCG tablet Take 50 mcg by mouth daily before breakfast.    Historical Provider, MD  lubiprostone (AMITIZA) 8 MCG capsule Take 8 mcg by mouth 2 (two) times daily.     Historical Provider, MD  nystatin-triamcinolone (MYCOLOG II) cream Apply 1 application topically daily as needed (antifungal).     Historical Provider, MD   BP 110/61 mmHg  Pulse 69  Temp(Src) 97.4 F (36.3 C) (Oral)  Resp 16  SpO2 100% Physical Exam  Constitutional: She appears well-developed and well-nourished. No distress.  HENT:  Head: Normocephalic and atraumatic.  Mouth/Throat: Oropharynx is clear and  moist.  Eyes: Pupils are equal, round, and reactive to light.  Neck: Normal range of motion. Neck supple.  Cardiovascular: Normal rate, regular rhythm, normal heart sounds and intact distal pulses.  Exam reveals no gallop and no friction rub.   No murmur heard. Pulmonary/Chest: Effort normal and breath sounds normal. No respiratory distress.  Musculoskeletal: She exhibits no edema.  Neurological: She is alert. No cranial nerve deficit. She exhibits normal muscle tone. Coordination normal.  Left arm weak 2/2 arthritis prior to falls  Skin: Skin is warm and dry. She is not diaphoretic.     Approximately 3-4cm linear laceration to the medial left forehead covered in dried blood. Patient presented with significant amount of dried blood covering her right face and left hand  Psychiatric: She has a normal mood and affect. Her behavior is normal.  Nursing note and vitals reviewed.   ED Course  Procedures (including critical care time) Labs Review Labs Reviewed  BASIC METABOLIC PANEL - Abnormal; Notable for the following:    Potassium 5.3 (*)    CO2 16 (*)    Glucose, Bld 175 (*)    BUN 53 (*)    Creatinine, Ser 3.80 (*)    GFR calc non Af Amer 10 (*)    GFR calc Af Amer 11 (*)    Anion gap 17 (*)    All other components within normal limits  CBC WITH DIFFERENTIAL/PLATELET - Abnormal; Notable for the following:    WBC 15.4 (*)    RBC 3.85 (*)    Hemoglobin 11.0 (*)    HCT 34.1 (*)    Neutro Abs 12.2 (*)    All other components within normal limits  URINALYSIS, ROUTINE W REFLEX MICROSCOPIC (NOT AT [facility])  CK    Imaging Review Ct Head Wo Contrast  04/10/2015  CLINICAL DATA:  Pain following fall EXAM: CT HEAD WITHOUT CONTRAST CT CERVICAL SPINE WITHOUT CONTRAST TECHNIQUE: Multidetector CT imaging of the head and cervical spine was performed following the standard protocol without intravenous contrast. Multiplanar CT image reconstructions of the cervical spine were also generated.  COMPARISON:  Head CT September 01, 2013 FINDINGS: CT HEAD FINDINGS Moderate diffuse atrophy is stable. There is no intracranial mass, hemorrhage, extra-axial fluid collection, or midline shift. There is patchy small vessel disease in the centra semiovale bilaterally. There is no new gray-white compartment lesion. No acute infarct evident. The bony calvarium appears intact. The mastoid air cells are clear. No intraorbital lesions are identified. CT CERVICAL SPINE FINDINGS There is no demonstrable fracture or spondylolisthesis. The prevertebral soft tissues and predental space regions are normal. There is partial ankylosis at C3-4 and C4-5. There is marked disc space narrowing at C5-6. There is moderately severe disc space narrowing at C6-7. There is facet hypertrophy to varying degrees at most levels bilaterally. There is calcified central disc protrusion at C4-5 and C6-7 ; there  is no frank stenosis at these levels. There are foci of calcification in each carotid artery. There is also calcification in the aortic arch region. There is marked osteoarthritic change in the right temporomandibular joint. IMPRESSION: CT head: Stable atrophy with periventricular small vessel disease. No intracranial mass, hemorrhage, or extra-axial fluid collection. No acute infarct evident. CT cervical spine: No fracture or spondylolisthesis. Extensive spondylosis and osteoarthritic change at multiple levels. Calcification is noted in both carotid arteries. There is marked osteoarthritic change in the right temporomandibular joint. Electronically Signed   By: Bretta Bang III M.D.   On: 03/14/2015 14:23   Ct Cervical Spine Wo Contrast  03/15/2015  CLINICAL DATA:  Pain following fall EXAM: CT HEAD WITHOUT CONTRAST CT CERVICAL SPINE WITHOUT CONTRAST TECHNIQUE: Multidetector CT imaging of the head and cervical spine was performed following the standard protocol without intravenous contrast. Multiplanar CT image reconstructions of the  cervical spine were also generated. COMPARISON:  Head CT September 01, 2013 FINDINGS: CT HEAD FINDINGS Moderate diffuse atrophy is stable. There is no intracranial mass, hemorrhage, extra-axial fluid collection, or midline shift. There is patchy small vessel disease in the centra semiovale bilaterally. There is no new gray-white compartment lesion. No acute infarct evident. The bony calvarium appears intact. The mastoid air cells are clear. No intraorbital lesions are identified. CT CERVICAL SPINE FINDINGS There is no demonstrable fracture or spondylolisthesis. The prevertebral soft tissues and predental space regions are normal. There is partial ankylosis at C3-4 and C4-5. There is marked disc space narrowing at C5-6. There is moderately severe disc space narrowing at C6-7. There is facet hypertrophy to varying degrees at most levels bilaterally. There is calcified central disc protrusion at C4-5 and C6-7 ; there is no frank stenosis at these levels. There are foci of calcification in each carotid artery. There is also calcification in the aortic arch region. There is marked osteoarthritic change in the right temporomandibular joint. IMPRESSION: CT head: Stable atrophy with periventricular small vessel disease. No intracranial mass, hemorrhage, or extra-axial fluid collection. No acute infarct evident. CT cervical spine: No fracture or spondylolisthesis. Extensive spondylosis and osteoarthritic change at multiple levels. Calcification is noted in both carotid arteries. There is marked osteoarthritic change in the right temporomandibular joint. Electronically Signed   By: Bretta Bang III M.D.   On: 03/19/2015 14:23   Dg Knee Complete 4 Views Right  03/20/2015  CLINICAL DATA:  82 year old who fell while at home last night, injuring the right knee. Initial encounter. EXAM: RIGHT KNEE - COMPLETE 4+ VIEW COMPARISON:  09/01/2013. FINDINGS: Severe tricompartment joint space narrowing associated with a very large joint  effusion, as noted on the prior examination. Extensive modeling of the tibia related to the chronic degenerative changes. No convincing evidence of an acute fracture. Osseous demineralization. Prepatellar soft tissue swelling/ ecchymosis. Extensive femoropopliteal and tibioperoneal artery atherosclerotic calcification. IMPRESSION: 1. No convincing evidence of acute osseous abnormality. 2. Severe tricompartment osteoarthritis with extensive modeling of the tibia related to the chronic degenerative changes. 3. Chronic very large joint effusion. Electronically Signed   By: Hulan Saas M.D.   On: 03/07/2015 14:32   I have personally reviewed and evaluated these images and lab results as part of my medical decision-making.    Patient will be admitted for further evaluation and care.  She has a vague history of these falls and several extended multiple falls recently.  Patient is also got some abnormalities in her electrolytes along with her white blood cell count.  Her  urinalysis still pending at this time.  There is no abnormal vital signs.  LACERATION REPAIR Performed by: Carlyle Dolly Authorized by: Carlyle Dolly Consent: Verbal consent obtained. Risks and benefits: risks, benefits and alternatives were discussed Consent given by: patient Patient identity confirmed: provided demographic data Prepped and Draped in normal sterile fashion Wound explored  Laceration Location: L forehead  Laceration Length: 4 cm  No Foreign Bodies seen or palpated  Anesthesia: local infiltration  Local anesthetic: lidocaine 2% wo epinephrine  Anesthetic total: 4 ml  Irrigation method: syringe Amount of cleaning: standard  Skin closure: 6-0 Prolene  Number of sutures: 8  Technique: simple interrupted  Patient tolerance: Patient tolerated the procedure well with no immediate complications.   Charlestine Night, PA-C 03/25/2015 1630  Pricilla Loveless, MD 03/18/2015 1950

## 2015-04-01 NOTE — Consult Note (Signed)
Admit date: 03/30/2015 Referring Physician  Dr. Malachi Bonds Primary Physician Laurena Slimmer, MD Primary Cardiologist  Dr. Jens Som Reason for Consultation  elevated troponin  HPI: 82 year old female with history of coronary artery disease dating back to August 1999 where she percent with myocardial infarction. 90% proximal right coronary artery which was stented. She had 60-70% second marginal stenosis, normal left main and no obstructive disease in her LAD at that time who presents here with fall, prolonged stay on ground with elevated CK and troponin as well as abnormal EKG with T-wave inversions concerning for ischemia.  She came in with acute on chronic renal failure, hyperkalemia, anemia, leukocytosis. According to prior history she is quite unreliable due to dementia and she has been falling a lot recently. She perhaps lost her balance trying to grab a chair and fell and hit her head. Bruising is noted but CT of head is negative for bleed. No shortness of breath, no vomiting, no chest pain.  PMH:   Past Medical History  Diagnosis Date  . CAD (coronary artery disease)   . Gout   . Hyperlipemia   . Hypertension   . Diabetes mellitus without complication (HCC)   . MI (myocardial infarction) [facility]) August 1999    PCI RCA  . Anemia   . Diastolic heart failure (HCC)   . Chronic kidney disease, stage IV (severe) (HCC)   . Shortness of breath   . Arthritis     KNEES & BACK   . Dementia     PSH:   Past Surgical History  Procedure Laterality Date  . Abdominal surgery    . Replacement total knee Left   . Total shoulder replacement Right   . Appendectomy    . Cholecystectomy    . Cardiac catheterization  1999    LAD OK, OM2 70%, RCA 90/80% with spont dissection, S/P PCI RCA   Allergies:  Prinivil Prior to Admit Meds:   Prior to Admission medications   Medication Sig Start Date End Date Taking? Authorizing Provider  allopurinol (ZYLOPRIM) 100 MG tablet Take 100 mg by mouth daily.    Yes Historical Provider, MD  aspirin EC 81 MG tablet Take 81 mg by mouth daily.   Yes Historical Provider, MD  carvedilol (COREG) 6.25 MG tablet Take 6.25 mg by mouth 2 (two) times daily with a meal.   Yes Historical Provider, MD  docusate sodium 100 MG CAPS Take 100 mg by mouth 2 (two) times daily. 09/05/13  Yes Renae Fickle, MD  glipiZIDE (GLUCOTROL XL) 2.5 MG 24 hr tablet Take 2.5 mg by mouth daily with breakfast.   Yes Historical Provider, MD  levothyroxine (SYNTHROID, LEVOTHROID) 50 MCG tablet Take 50 mcg by mouth daily before breakfast.   Yes Historical Provider, MD  lubiprostone (AMITIZA) 8 MCG capsule Take 8 mcg by mouth 2 (two) times daily.    Yes Historical Provider, MD  nystatin-triamcinolone (MYCOLOG II) cream Apply 1 application topically daily as needed (antifungal).    Yes Historical Provider, MD   Fam HX:    Family History  Problem Relation Age of Onset  . Arthritis Mother     Died at age 44  . Arthritis Sister    Social HX:    Social History   Social History  . Marital Status: Widowed    Spouse Name: N/A  . Number of Children: N/A  . Years of Education: N/A   Occupational History  . Retired    Social History Main Topics  .  Smoking status: Former Games developer  . Smokeless tobacco: Never Used  . Alcohol Use: No  . Drug Use: No  . Sexual Activity: Not on file   Other Topics Concern  . Not on file   Social History Narrative   Her son helps care for her. There is reportedly a family history of coronary artery disease but no further details are available.     ROS:   unable to get full review of systems but as aboveAll 11 ROS were addressed and are negative except what is stated in the HPI   Physical Exam: Blood pressure 124/72, pulse 72, temperature 97.4 F (36.3 C), temperature source Oral, resp. rate 18, SpO2 98 %.   GenElderlyn no acute distress Head: Eyes PERRLA,  scalp laceration noted No xanthomas.   Normal cephalic and a  Lungs:   Clear bilaterally to  auscultation and percussion. Normal respiratory effort. No wheezes, no rales. Heart:   HRRR S1 S2 Pulses are 2+ & equal. No murmur, rubs, gallops.  No carotid bruit. No JVD.  No abdominal bruits.  Abdomen: Bowel sounds are positive, abdomen soft and non-tender without masses. No hepatosplenomegaly. Msk:  Back normal. Normal strength and tone for age. Extremities:  No clubbing, cyanosis or edema.  DP +1 NeuConfused at times , non-focal, MAE x 4 GU: Deferred Rectal: Deferred Psych:  Good affect, responds appropriately      Labs: Lab Results  Component Value Date   WBC 15.4* 04-02-2015   HGB 11.0* 02-Apr-2015   HCT 34.1* 04/02/2015   MCV 88.6 April 02, 2015   PLT 156 04-02-15     Recent Labs Lab 04-02-2015 1518  NA 139  K 5.3*  CL 106  CO2 16*  BUN 53*  CREATININE 3.80*  CALCIUM 9.4  GLUCOSE 175*    Recent Labs  02-Apr-2015 1641  CKTOTAL 2000*   No results found for: CHOL, HDL, LDLCALC, TRIG No results found for: DDIMER   Radiology:  Ct Head Wo Contrast  2015-04-02  CLINICAL DATA:  Pain following fall EXAM: CT HEAD WITHOUT CONTRAST CT CERVICAL SPINE WITHOUT CONTRAST TECHNIQUE: Multidetector CT imaging of the head and cervical spine was performed following the standard protocol without intravenous contrast. Multiplanar CT image reconstructions of the cervical spine were also generated. COMPARISON:  Head CT September 01, 2013 FINDINGS: CT HEAD FINDINGS Moderate diffuse atrophy is stable. There is no intracranial mass, hemorrhage, extra-axial fluid collection, or midline shift. There is patchy small vessel disease in the centra semiovale bilaterally. There is no new gray-white compartment lesion. No acute infarct evident. The bony calvarium appears intact. The mastoid air cells are clear. No intraorbital lesions are identified. CT CERVICAL SPINE FINDINGS There is no demonstrable fracture or spondylolisthesis. The prevertebral soft tissues and predental space regions are normal. There is  partial ankylosis at C3-4 and C4-5. There is marked disc space narrowing at C5-6. There is moderately severe disc space narrowing at C6-7. There is facet hypertrophy to varying degrees at most levels bilaterally. There is calcified central disc protrusion at C4-5 and C6-7 ; there is no frank stenosis at these levels. There are foci of calcification in each carotid artery. There is also calcification in the aortic arch region. There is marked osteoarthritic change in the right temporomandibular joint. IMPRESSION: CT head: Stable atrophy with periventricular small vessel disease. No intracranial mass, hemorrhage, or extra-axial fluid collection. No acute infarct evident. CT cervical spine: No fracture or spondylolisthesis. Extensive spondylosis and osteoarthritic change at multiple levels. Calcification is noted  in both carotid arteries. There is marked osteoarthritic change in the right temporomandibular joint. Electronically Signed   By: Bretta Bang III M.D.   On: 03/27/2015 14:23   Ct Cervical Spine Wo Contrast  03/27/2015  CLINICAL DATA:  Pain following fall EXAM: CT HEAD WITHOUT CONTRAST CT CERVICAL SPINE WITHOUT CONTRAST TECHNIQUE: Multidetector CT imaging of the head and cervical spine was performed following the standard protocol without intravenous contrast. Multiplanar CT image reconstructions of the cervical spine were also generated. COMPARISON:  Head CT September 01, 2013 FINDINGS: CT HEAD FINDINGS Moderate diffuse atrophy is stable. There is no intracranial mass, hemorrhage, extra-axial fluid collection, or midline shift. There is patchy small vessel disease in the centra semiovale bilaterally. There is no new gray-white compartment lesion. No acute infarct evident. The bony calvarium appears intact. The mastoid air cells are clear. No intraorbital lesions are identified. CT CERVICAL SPINE FINDINGS There is no demonstrable fracture or spondylolisthesis. The prevertebral soft tissues and predental  space regions are normal. There is partial ankylosis at C3-4 and C4-5. There is marked disc space narrowing at C5-6. There is moderately severe disc space narrowing at C6-7. There is facet hypertrophy to varying degrees at most levels bilaterally. There is calcified central disc protrusion at C4-5 and C6-7 ; there is no frank stenosis at these levels. There are foci of calcification in each carotid artery. There is also calcification in the aortic arch region. There is marked osteoarthritic change in the right temporomandibular joint. IMPRESSION: CT head: Stable atrophy with periventricular small vessel disease. No intracranial mass, hemorrhage, or extra-axial fluid collection. No acute infarct evident. CT cervical spine: No fracture or spondylolisthesis. Extensive spondylosis and osteoarthritic change at multiple levels. Calcification is noted in both carotid arteries. There is marked osteoarthritic change in the right temporomandibular joint. Electronically Signed   By: Bretta Bang III M.D.   On: 03/08/2015 14:23   Dg Chest Port 1 View  03/22/2015  CLINICAL DATA:  Frequent falls, fell today, history of dementia EXAM: PORTABLE CHEST 1 VIEW COMPARISON:  09/01/2013 FINDINGS: Cardiomediastinal silhouette is stable. No acute infiltrate or pleural effusion. No pulmonary edema. Right shoulder prosthesis again noted. Atherosclerotic calcifications of thoracic aorta. Chronic mild elevation of the left hemidiaphragm. Mild right basilar atelectasis. No gross fractures are noted. Stable postsurgical changes left humeral head. IMPRESSION: No infiltrate or pulmonary edema. Mild right basilar atelectasis. Chronic elevation of the left hemidiaphragm. No gross fractures are noted. Electronically Signed   By: Natasha Mead M.D.   On: 04/03/2015 17:27   Dg Knee Complete 4 Views Right  04/04/2015  CLINICAL DATA:  82 year old who fell while at home last night, injuring the right knee. Initial encounter. EXAM: RIGHT KNEE -  COMPLETE 4+ VIEW COMPARISON:  09/01/2013. FINDINGS: Severe tricompartment joint space narrowing associated with a very large joint effusion, as noted on the prior examination. Extensive modeling of the tibia related to the chronic degenerative changes. No convincing evidence of an acute fracture. Osseous demineralization. Prepatellar soft tissue swelling/ ecchymosis. Extensive femoropopliteal and tibioperoneal artery atherosclerotic calcification. IMPRESSION: 1. No convincing evidence of acute osseous abnormality. 2. Severe tricompartment osteoarthritis with extensive modeling of the tibia related to the chronic degenerative changes. 3. Chronic very large joint effusion. Electronically Signed   By: Hulan Saas M.D.   On: 03/08/2015 14:32   Personally viewed.  EKG:   sinus rhythm with rather diffuse to deep T-wave inversions. Personally viewed.   ASSESSMENT/PLAN:    82 year old female with prior RCA stent  here with fall, dementia, elevated CK 2000, troponin of 28, abnormal EKG.  Elevated troponin  - troponin 28 consistent with non-ST elevation myocardial infarction  - Continue with medical management at this time.  - Consider starting IV heparin and continuing for 24-48 hours if she is not having any significant bleeding from her scalp laceration/nose . Head CT was negative for bleed.  - Continue aspirin, beta blocker, add statin  - Last echocardiogram in 2015 showed normal EF 55%.  - Given her current medical comorbidities, recurrent falls, recent renal impairment, she would not be a candidate for invasive management.  - Troponin 28, increased cardiac mortality.  - thankfully, she is not having any chest pain.  - It would not be unreasonable to check an echocardiogram to assess overall LV function.  CAD   - prior RCA stent  Acute on chronic renal failure  - Hydrating.  - Per primary team  We will follow along.   Donato Schultz, MD  03/11/2015  5:55 PM

## 2015-04-01 NOTE — Progress Notes (Signed)
Admission note:  Arrival Method: Pt arrived to unit on stretcher from ED with RN Mental Orientation: Alert and oriented x 4 Telemetry: Telemetry box 6e19 applied. CCMT notified. Verified with Guilford Shi, RN Assessment: Completed Skin: Pt with stitches to forehead. Skin assessed with Guilford Shi, RN IV: Left AC IV with Heparin infusing at 8.5/hr Pain: Pt states no pain at this time Tubes: N/A Safety Measures: Bed in lowest position, non-slip socks placed, call light within reach, bed alarm activated Fall Prevention Safety Plan: Reviewed with patient, patient verbalized understanding Admission Screening: In progress 6700 Orientation: Patient has been oriented to the unit, staff and to the room. Patient is lying comfortably in bed with no needs stated at this time. Orders have been reviewed and implemented. Call light is within reach, will continue to monitor the patient closely.   Feliciana Rossetti, RN, BSN

## 2015-04-01 NOTE — Progress Notes (Signed)
Report received from ED RN. Room is ready for patient.   Aubreanna Percle, RN, BSN 

## 2015-04-01 NOTE — ED Notes (Signed)
Paged Dr. Malachi Bonds regarding pt. Troponin.

## 2015-04-01 NOTE — ED Notes (Signed)
Patient placed on monitor, continuous pulse oximetry and blood pressure cuff 

## 2015-04-01 NOTE — ED Notes (Signed)
To ED via [facility]. With c/o fall sometime between 12am and 10am--- difficulty standing to pivot from chair to bed for EMS. Lives with grandson, but he is not there all of the time. Pt fell at some time last night also-- EMS went to house but pt refused to go to the hospital.

## 2015-04-01 NOTE — H&P (Signed)
[facility] History and Physical  [name] [name] ZOX:[id number] DOB: [birth year] DOA: 2015/04/19  Referring physician: law PCP: Laurena Slimmer, MD   Chief Complaint: fall  HPI: [name] [name] is a very demented and pleasant 82 y.o. female with a past medical history that includes hypertension, CAD, diabetes, diastolic heart failure, chronic kidney disease stage IV presents to the emergency department from home via EMS the chief complaint of fall. Initial evaluation reveals acute on chronic renal failure, dehydration, hyperkalemia, anemia, leukocytosis.  Information is obtained from the patient but she is right unreliable due to dementia. She reports falling "a lot lately". When asked what she believed was the cause of her frequent falling she said "[place] im old as hell". Chart review indicates reportedly patient fell last night at her house EMS was called and she refused to come to the half hospital. She states that she believes she was closing the blinds lost her balance tried to grab a chair fell and hit her head. She thinks she was on the floor for about 2 hours. He denies any loss of consciousness. She denies chest pain palpitations dizziness syncope or near-syncope. She denies any shortness of breath diaphoresis abdominal pain nausea vomiting. She does report ongoing constipation believes her last bowel movement was a week ago. He denies melena bright red blood per rectum dysuria hematuria frequency or urgency.  In the emergency department she is afebrile hemodynamically stable and not hypoxic. She is provided with 1 L of normal saline intravenously.  Review of Systems:  10 point review of systems complete and all systems are negative except as indicated in the history of present illness  Past Medical History  Diagnosis Date  . CAD (coronary artery disease)   . Gout   . Hyperlipemia   . Hypertension   . Diabetes mellitus without complication (HCC)   . MI (myocardial infarction)  [facility]) August 1999    PCI RCA  . Anemia   . Diastolic heart failure (HCC)   . Chronic kidney disease, stage IV (severe) (HCC)   . Shortness of breath   . Arthritis     KNEES & BACK   . Dementia    Past Surgical History  Procedure Laterality Date  . Abdominal surgery    . Replacement total knee Left   . Total shoulder replacement Right   . Appendectomy    . Cholecystectomy    . Cardiac catheterization  1999    LAD OK, OM2 70%, RCA 90/80% with spont dissection, S/P PCI RCA   Social History:  reports that she has quit smoking. She has never used smokeless tobacco. She reports that she does not drink alcohol or use illicit drugs. She reports living at home with her husband and children. 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 She reports using a cane walker and having a wheelchair.  Allergies  Allergen Reactions  . Prinivil [Lisinopril]     Pt reports unaware of allergy    Family History  Problem Relation Age of Onset  . Arthritis Mother     Died at age 22  . Arthritis Sister    family medical history discussed with this elderly patient and is negative for cancer stroke heart disease. Positive for diabetes  Prior to Admission medications   Medication Sig Start Date End Date Taking? Authorizing Provider  allopurinol (ZYLOPRIM) 100 MG tablet Take 100 mg by mouth daily.   Yes Historical Provider, MD  aspirin EC 81 MG tablet  Take 81 mg by mouth daily.   Yes Historical Provider, MD  carvedilol (COREG) 6.25 MG tablet Take 6.25 mg by mouth 2 (two) times daily with a meal.   Yes Historical Provider, MD  docusate sodium 100 MG CAPS Take 100 mg by mouth 2 (two) times daily. 09/05/13  Yes Renae Fickle, MD  glipiZIDE (GLUCOTROL XL) 2.5 MG 24 hr tablet Take 2.5 mg by mouth daily with breakfast.   Yes Historical Provider, MD  levothyroxine (SYNTHROID, LEVOTHROID) 50 MCG tablet Take 50 mcg by mouth daily before breakfast.   Yes Historical Provider, MD   lubiprostone (AMITIZA) 8 MCG capsule Take 8 mcg by mouth 2 (two) times daily.    Yes Historical Provider, MD  nystatin-triamcinolone (MYCOLOG II) cream Apply 1 application topically daily as needed (antifungal).    Yes Historical Provider, MD   Physical Exam: Filed Vitals:   03/17/2015 1312 03/31/2015 1631 03/24/2015 1700  BP: 110/61 124/72   Pulse: 69 72   Temp: 97.4 F (36.3 C)    TempSrc: Oral    Resp: 16 18   Height:   5' 2.99" (1.6 m)  Weight:   77.111 kg (170 lb)  SpO2: 100% 98%     Wt Readings from Last 3 Encounters:  03/11/2015 77.111 kg (170 lb)  09/22/13 81.466 kg (179 lb 9.6 oz)  09/10/13 80.967 kg (178 lb 8 oz)    General:  Appears calm and comfortable, unkept, clothes dirty  Eyes: PERRL, normal lids, irises & conjunctiva ENT: grossly normal hearing,  Neck: no LAD, masses or thyromegaly Cardiovascular: RRR, no m/r/g. Trace LE edema. Respiratory: CTA bilaterally, no w/r/r. Normal respiratory effort. Abdomen: soft, ntnd positive bowel sounds no guarding Skin: laceration  Left forehead. Sutures intact.  no rash or induration seen on limited exam Musculoskeletal: grossly normal tone BUE/BLE, right knee with small amount of edema no erythema decreased range of motion due to pain Psychiatric: grossly normal mood and affect, speech fluent and appropriate Neurologic: grossly non-focal. oriented to self and place only. Speech is clear bilateral grip 5 out of 5 lower extremity strength 4 out of 5 bilaterally           Labs on Admission:  Basic Metabolic Panel:  Recent Labs Lab 03/28/2015 1518  NA 139  K 5.3*  CL 106  CO2 16*  GLUCOSE 175*  BUN 53*  CREATININE 3.80*  CALCIUM 9.4   Liver Function Tests: No results for input(s): AST, ALT, ALKPHOS, BILITOT, PROT, ALBUMIN in the last 168 hours. No results for input(s): LIPASE, AMYLASE in the last 168 hours. No results for input(s): AMMONIA in the last 168 hours. CBC:  Recent Labs Lab 03/25/2015 1518  WBC 15.4*   NEUTROABS 12.2*  HGB 11.0*  HCT 34.1*  MCV 88.6  PLT 156   Cardiac Enzymes:  Recent Labs Lab 03/26/2015 1641  CKTOTAL 2000*    BNP (last 3 results) No results for input(s): BNP in the last 8760 hours.  ProBNP (last 3 results) No results for input(s): PROBNP in the last 8760 hours.  CBG: No results for input(s): GLUCAP in the last 168 hours.  Radiological Exams on Admission: Ct Head Wo Contrast  03/29/2015  CLINICAL DATA:  Pain following fall EXAM: CT HEAD WITHOUT CONTRAST CT CERVICAL SPINE WITHOUT CONTRAST TECHNIQUE: Multidetector CT imaging of the head and cervical spine was performed following the standard protocol without intravenous contrast. Multiplanar CT image reconstructions of the cervical spine were also generated. COMPARISON:  Head CT September 01, 2013 FINDINGS: CT HEAD FINDINGS Moderate diffuse atrophy is stable. There is no intracranial mass, hemorrhage, extra-axial fluid collection, or midline shift. There is patchy small vessel disease in the centra semiovale bilaterally. There is no new gray-white compartment lesion. No acute infarct evident. The bony calvarium appears intact. The mastoid air cells are clear. No intraorbital lesions are identified. CT CERVICAL SPINE FINDINGS There is no demonstrable fracture or spondylolisthesis. The prevertebral soft tissues and predental space regions are normal. There is partial ankylosis at C3-4 and C4-5. There is marked disc space narrowing at C5-6. There is moderately severe disc space narrowing at C6-7. There is facet hypertrophy to varying degrees at most levels bilaterally. There is calcified central disc protrusion at C4-5 and C6-7 ; there is no frank stenosis at these levels. There are foci of calcification in each carotid artery. There is also calcification in the aortic arch region. There is marked osteoarthritic change in the right temporomandibular joint. IMPRESSION: CT head: Stable atrophy with periventricular small vessel  disease. No intracranial mass, hemorrhage, or extra-axial fluid collection. No acute infarct evident. CT cervical spine: No fracture or spondylolisthesis. Extensive spondylosis and osteoarthritic change at multiple levels. Calcification is noted in both carotid arteries. There is marked osteoarthritic change in the right temporomandibular joint. Electronically Signed   By: Bretta Bang III M.D.   On: Apr 26, 2015 14:23   Ct Cervical Spine Wo Contrast  04/26/2015  CLINICAL DATA:  Pain following fall EXAM: CT HEAD WITHOUT CONTRAST CT CERVICAL SPINE WITHOUT CONTRAST TECHNIQUE: Multidetector CT imaging of the head and cervical spine was performed following the standard protocol without intravenous contrast. Multiplanar CT image reconstructions of the cervical spine were also generated. COMPARISON:  Head CT September 01, 2013 FINDINGS: CT HEAD FINDINGS Moderate diffuse atrophy is stable. There is no intracranial mass, hemorrhage, extra-axial fluid collection, or midline shift. There is patchy small vessel disease in the centra semiovale bilaterally. There is no new gray-white compartment lesion. No acute infarct evident. The bony calvarium appears intact. The mastoid air cells are clear. No intraorbital lesions are identified. CT CERVICAL SPINE FINDINGS There is no demonstrable fracture or spondylolisthesis. The prevertebral soft tissues and predental space regions are normal. There is partial ankylosis at C3-4 and C4-5. There is marked disc space narrowing at C5-6. There is moderately severe disc space narrowing at C6-7. There is facet hypertrophy to varying degrees at most levels bilaterally. There is calcified central disc protrusion at C4-5 and C6-7 ; there is no frank stenosis at these levels. There are foci of calcification in each carotid artery. There is also calcification in the aortic arch region. There is marked osteoarthritic change in the right temporomandibular joint. IMPRESSION: CT head: Stable atrophy  with periventricular small vessel disease. No intracranial mass, hemorrhage, or extra-axial fluid collection. No acute infarct evident. CT cervical spine: No fracture or spondylolisthesis. Extensive spondylosis and osteoarthritic change at multiple levels. Calcification is noted in both carotid arteries. There is marked osteoarthritic change in the right temporomandibular joint. Electronically Signed   By: Bretta Bang III M.D.   On: April 26, 2015 14:23   Dg Chest Port 1 View  April 26, 2015  CLINICAL DATA:  Frequent falls, fell today, history of dementia EXAM: PORTABLE CHEST 1 VIEW COMPARISON:  09/01/2013 FINDINGS: Cardiomediastinal silhouette is stable. No acute infiltrate or pleural effusion. No pulmonary edema. Right shoulder prosthesis again noted. Atherosclerotic calcifications of thoracic aorta. Chronic mild elevation of the left hemidiaphragm. Mild right basilar atelectasis. No gross fractures are noted. Stable postsurgical  changes left humeral head. IMPRESSION: No infiltrate or pulmonary edema. Mild right basilar atelectasis. Chronic elevation of the left hemidiaphragm. No gross fractures are noted. Electronically Signed   By: Natasha Mead M.D.   On: 03/18/2015 17:27   Dg Knee Complete 4 Views Right  03/23/2015  CLINICAL DATA:  82 year old who fell while at home last night, injuring the right knee. Initial encounter. EXAM: RIGHT KNEE - COMPLETE 4+ VIEW COMPARISON:  09/01/2013. FINDINGS: Severe tricompartment joint space narrowing associated with a very large joint effusion, as noted on the prior examination. Extensive modeling of the tibia related to the chronic degenerative changes. No convincing evidence of an acute fracture. Osseous demineralization. Prepatellar soft tissue swelling/ ecchymosis. Extensive femoropopliteal and tibioperoneal artery atherosclerotic calcification. IMPRESSION: 1. No convincing evidence of acute osseous abnormality. 2. Severe tricompartment osteoarthritis with extensive  modeling of the tibia related to the chronic degenerative changes. 3. Chronic very large joint effusion. Electronically Signed   By: Hulan Saas M.D.   On: 04/04/2015 14:32    EKG: Sinus rhythm Prolonged PR interval Borderline left axis deviation Abnormal R-wave progression, early transition new Abnormal T, consider ischemia, diffuse leads  Assessment/Plan Principal Problem:   Acute renal failure superimposed on stage 4 chronic kidney disease (HCC) Active Problems:   ANEMIA   Essential hypertension   Coronary atherosclerosis   Chronic diastolic heart failure (HCC)   DM (diabetes mellitus) type II controlled with renal manifestation (HCC)   Acute on chronic renal failure (HCC)   Hyperkalemia   Leukocytosis   Falls   Weakness   Dementia   Osteoarthritis   Elevated troponin   Acidosis  #1. Acute renal failure superimposed on stage IV chronic kidney disease. I clear as result of fall and decreased oral intake. Creatinine 3.8 on admission. Chart review indicates creatinine 2.4 one year ago. -We will admit -We'll hold nephrotoxins -Gentle IV fluids -obtain CK -Monitor urine output -No improvement consider renal ultrasound  #2. Elevated troponin/abnormal EKG. No complaints of chest pain. Significant history of right CAD. MI 1999. Discussed with Dr. Herbie Baltimore with cardiology. Recommended coagulation and echo in the morning. -Trend troponins -Repeat EKG in the a.m. -Heparin per pharmacy -Echo in the a.m. -aspirin -statin  #2. Frequent falls/ weakness. History of same. History of deconditioning, osteoarthritis unwillingness to consider snf. CT of the head without any acute abnormalities. Xray right knee severe osteoarthritis. No current signs of infectious process. Some metabolic derangements. -Gentle IV fluids -Cycle troponins -pain managment -Monitor telemetry -orthostatics -Physical therapy -Social work consult -Hold sedating medications -consider HH PT at  discharge  #3. Chronic diastolic heart failure. Compensated. Echo in June 2015 with an EF of 55% and grade 1 diastolic dysfunction. Home medications include Coreg, -Obtain daily weights -Monitor urine output -Continue home beta blocker  #4. Hypertension. sTable in the emergency department. -Continue home meds  5. Hyperkalemia. Mild. Likely related to #1. -IV fluids as noted above -Monitor on telemetry -Recheck in the morning.  6. Diabetes. Home medications include glipizide. Serum glucose 175 on admission -Obtain a hemoglobin A1c -Hold oral agents for now -Sliding scale insulin for optimal control  7. Hypothyroid.  -obtain TSH -continue synthroid  #8. Dementia. Appears stable at baseline. Unable to contact family. Patient appears very unkept (dirty clothes, dirty hair, dirty nails) concern about safety -social work consult -consider HH at discharge  #9. Osteoarthritis/right knee pain. Xray with severe tricompartment osteo with extensive remodeling of tibia related to chronic degenerative changes.  -pain management -physical therapy  Code Status: DNR DVT Prophylaxis: Family Communication: none present Disposition Plan: home when ready. May need HH  Time spent: 65 minutes  [facility] M [facility]

## 2015-04-02 ENCOUNTER — Observation Stay (HOSPITAL_COMMUNITY): Payer: Medicare Other

## 2015-04-02 DIAGNOSIS — D72829 Elevated white blood cell count, unspecified: Secondary | ICD-10-CM | POA: Diagnosis present

## 2015-04-02 DIAGNOSIS — R627 Adult failure to thrive: Secondary | ICD-10-CM | POA: Diagnosis present

## 2015-04-02 DIAGNOSIS — N184 Chronic kidney disease, stage 4 (severe): Secondary | ICD-10-CM | POA: Diagnosis not present

## 2015-04-02 DIAGNOSIS — I255 Ischemic cardiomyopathy: Secondary | ICD-10-CM | POA: Diagnosis not present

## 2015-04-02 DIAGNOSIS — M6282 Rhabdomyolysis: Secondary | ICD-10-CM | POA: Diagnosis present

## 2015-04-02 DIAGNOSIS — D649 Anemia, unspecified: Secondary | ICD-10-CM

## 2015-04-02 DIAGNOSIS — E875 Hyperkalemia: Secondary | ICD-10-CM | POA: Diagnosis present

## 2015-04-02 DIAGNOSIS — R0682 Tachypnea, not elsewhere classified: Secondary | ICD-10-CM | POA: Diagnosis present

## 2015-04-02 DIAGNOSIS — F0390 Unspecified dementia without behavioral disturbance: Secondary | ICD-10-CM

## 2015-04-02 DIAGNOSIS — E872 Acidosis: Secondary | ICD-10-CM | POA: Diagnosis present

## 2015-04-02 DIAGNOSIS — J9601 Acute respiratory failure with hypoxia: Secondary | ICD-10-CM | POA: Diagnosis not present

## 2015-04-02 DIAGNOSIS — Z66 Do not resuscitate: Secondary | ICD-10-CM | POA: Diagnosis present

## 2015-04-02 DIAGNOSIS — M1711 Unilateral primary osteoarthritis, right knee: Secondary | ICD-10-CM | POA: Diagnosis present

## 2015-04-02 DIAGNOSIS — I5032 Chronic diastolic (congestive) heart failure: Secondary | ICD-10-CM | POA: Diagnosis not present

## 2015-04-02 DIAGNOSIS — I252 Old myocardial infarction: Secondary | ICD-10-CM | POA: Diagnosis not present

## 2015-04-02 DIAGNOSIS — I429 Cardiomyopathy, unspecified: Secondary | ICD-10-CM | POA: Diagnosis present

## 2015-04-02 DIAGNOSIS — R339 Retention of urine, unspecified: Secondary | ICD-10-CM | POA: Diagnosis not present

## 2015-04-02 DIAGNOSIS — I130 Hypertensive heart and chronic kidney disease with heart failure and stage 1 through stage 4 chronic kidney disease, or unspecified chronic kidney disease: Secondary | ICD-10-CM | POA: Diagnosis present

## 2015-04-02 DIAGNOSIS — I10 Essential (primary) hypertension: Secondary | ICD-10-CM

## 2015-04-02 DIAGNOSIS — E785 Hyperlipidemia, unspecified: Secondary | ICD-10-CM | POA: Diagnosis present

## 2015-04-02 DIAGNOSIS — Z955 Presence of coronary angioplasty implant and graft: Secondary | ICD-10-CM | POA: Diagnosis not present

## 2015-04-02 DIAGNOSIS — I214 Non-ST elevation (NSTEMI) myocardial infarction: Secondary | ICD-10-CM | POA: Insufficient documentation

## 2015-04-02 DIAGNOSIS — E039 Hypothyroidism, unspecified: Secondary | ICD-10-CM | POA: Diagnosis present

## 2015-04-02 DIAGNOSIS — N179 Acute kidney failure, unspecified: Secondary | ICD-10-CM | POA: Diagnosis present

## 2015-04-02 DIAGNOSIS — Z7982 Long term (current) use of aspirin: Secondary | ICD-10-CM | POA: Diagnosis not present

## 2015-04-02 DIAGNOSIS — I5043 Acute on chronic combined systolic (congestive) and diastolic (congestive) heart failure: Secondary | ICD-10-CM | POA: Diagnosis present

## 2015-04-02 DIAGNOSIS — E860 Dehydration: Secondary | ICD-10-CM | POA: Diagnosis present

## 2015-04-02 DIAGNOSIS — R296 Repeated falls: Secondary | ICD-10-CM | POA: Insufficient documentation

## 2015-04-02 DIAGNOSIS — K5900 Constipation, unspecified: Secondary | ICD-10-CM | POA: Diagnosis present

## 2015-04-02 DIAGNOSIS — I2510 Atherosclerotic heart disease of native coronary artery without angina pectoris: Secondary | ICD-10-CM | POA: Diagnosis present

## 2015-04-02 DIAGNOSIS — Z515 Encounter for palliative care: Secondary | ICD-10-CM | POA: Diagnosis not present

## 2015-04-02 DIAGNOSIS — Z96652 Presence of left artificial knee joint: Secondary | ICD-10-CM | POA: Diagnosis present

## 2015-04-02 DIAGNOSIS — R7989 Other specified abnormal findings of blood chemistry: Secondary | ICD-10-CM | POA: Diagnosis not present

## 2015-04-02 DIAGNOSIS — W19XXXA Unspecified fall, initial encounter: Secondary | ICD-10-CM | POA: Diagnosis present

## 2015-04-02 DIAGNOSIS — S0181XA Laceration without foreign body of other part of head, initial encounter: Secondary | ICD-10-CM | POA: Diagnosis present

## 2015-04-02 DIAGNOSIS — Z96611 Presence of right artificial shoulder joint: Secondary | ICD-10-CM | POA: Diagnosis present

## 2015-04-02 DIAGNOSIS — L8992 Pressure ulcer of unspecified site, stage 2: Secondary | ICD-10-CM | POA: Diagnosis present

## 2015-04-02 DIAGNOSIS — Z87891 Personal history of nicotine dependence: Secondary | ICD-10-CM | POA: Diagnosis not present

## 2015-04-02 DIAGNOSIS — I350 Nonrheumatic aortic (valve) stenosis: Secondary | ICD-10-CM | POA: Diagnosis present

## 2015-04-02 DIAGNOSIS — E1122 Type 2 diabetes mellitus with diabetic chronic kidney disease: Secondary | ICD-10-CM | POA: Diagnosis present

## 2015-04-02 DIAGNOSIS — I5021 Acute systolic (congestive) heart failure: Secondary | ICD-10-CM | POA: Diagnosis not present

## 2015-04-02 DIAGNOSIS — L89152 Pressure ulcer of sacral region, stage 2: Secondary | ICD-10-CM | POA: Diagnosis present

## 2015-04-02 DIAGNOSIS — D696 Thrombocytopenia, unspecified: Secondary | ICD-10-CM | POA: Diagnosis present

## 2015-04-02 DIAGNOSIS — I4581 Long QT syndrome: Secondary | ICD-10-CM | POA: Diagnosis present

## 2015-04-02 DIAGNOSIS — Z794 Long term (current) use of insulin: Secondary | ICD-10-CM | POA: Diagnosis not present

## 2015-04-02 LAB — BASIC METABOLIC PANEL
ANION GAP: 14 (ref 5–15)
BUN: 54 mg/dL — ABNORMAL HIGH (ref 6–20)
CO2: 19 mmol/L — ABNORMAL LOW (ref 22–32)
Calcium: 8.9 mg/dL (ref 8.9–10.3)
Chloride: 109 mmol/L (ref 101–111)
Creatinine, Ser: 3.74 mg/dL — ABNORMAL HIGH (ref 0.44–1.00)
GFR calc Af Amer: 11 mL/min — ABNORMAL LOW (ref >60)
GFR, EST NON AFRICAN AMERICAN: 10 mL/min — AB (ref >60)
GLUCOSE: 137 mg/dL — AB (ref 65–99)
POTASSIUM: 4.6 mmol/L (ref 3.5–5.1)
SODIUM: 142 mmol/L (ref 135–145)

## 2015-04-02 LAB — GLUCOSE, CAPILLARY
GLUCOSE-CAPILLARY: 147 mg/dL — AB (ref 65–99)
Glucose-Capillary: 133 mg/dL — ABNORMAL HIGH (ref 65–99)
Glucose-Capillary: 149 mg/dL — ABNORMAL HIGH (ref 65–99)
Glucose-Capillary: 116 mg/dL — ABNORMAL HIGH (ref 65–99)
Glucose-Capillary: 121 mg/dL — ABNORMAL HIGH (ref 65–99)

## 2015-04-02 LAB — CBC
HCT: 31.7 % — ABNORMAL LOW (ref 36.0–46.0)
Hemoglobin: 10.6 g/dL — ABNORMAL LOW (ref 12.0–15.0)
MCH: 29.9 pg (ref 26.0–34.0)
MCHC: 33.4 g/dL (ref 30.0–36.0)
MCV: 89.3 fL (ref 78.0–100.0)
PLATELETS: 163 10*3/uL (ref 150–400)
RBC: 3.55 MIL/uL — ABNORMAL LOW (ref 3.87–5.11)
RDW: 15.4 % (ref 11.5–15.5)
WBC: 14.6 10*3/uL — AB (ref 4.0–10.5)

## 2015-04-02 LAB — FERRITIN: FERRITIN: 296 ng/mL (ref 11–307)

## 2015-04-02 LAB — TSH: TSH: 2.508 u[IU]/mL (ref 0.350–4.500)

## 2015-04-02 LAB — LIPID PANEL
CHOLESTEROL: 159 mg/dL (ref 0–200)
HDL: 64 mg/dL (ref >40)
LDL Cholesterol: 82 mg/dL (ref 0–99)
TRIGLYCERIDES: 65 mg/dL (ref <150)
Total CHOL/HDL Ratio: 2.5 RATIO
VLDL: 13 mg/dL (ref 0–40)

## 2015-04-02 LAB — IRON AND TIBC
IRON: 44 ug/dL (ref 28–170)
Saturation Ratios: 22 % (ref 10.4–31.8)
TIBC: 204 ug/dL — ABNORMAL LOW (ref 250–450)
UIBC: 160 ug/dL

## 2015-04-02 LAB — HEPARIN LEVEL (UNFRACTIONATED)
Heparin Unfractionated: 0.22 [IU]/mL — ABNORMAL LOW (ref 0.30–0.70)
Heparin Unfractionated: 0.41 [IU]/mL (ref 0.30–0.70)

## 2015-04-02 LAB — TROPONIN I
Troponin I: 34.25 ng/mL (ref <0.031)
Troponin I: 28.42 ng/mL

## 2015-04-02 LAB — VITAMIN B12: VITAMIN B 12: 599 pg/mL (ref 180–914)

## 2015-04-02 LAB — FOLATE: Folate: 12.9 ng/mL (ref >5.9)

## 2015-04-02 MED ORDER — HYDROCODONE-ACETAMINOPHEN 5-325 MG PO TABS: 1.0000 | ORAL_TABLET | Freq: Four times a day (QID) | ORAL | Status: DC | PRN

## 2015-04-02 MED ORDER — MORPHINE SULFATE (PF) 2 MG/ML IV SOLN: 1.0000 mg | INTRAVENOUS | Status: DC | PRN

## 2015-04-02 MED ORDER — SODIUM CHLORIDE 0.9 % IV SOLN
INTRAVENOUS | Status: DC
  Administered 2015-04-02 – 2015-04-03 (×2): via INTRAVENOUS

## 2015-04-02 NOTE — Progress Notes (Addendum)
PT Cancellation Note  Patient Details Name: [name] [name] MRN: [id number] DOB: 1924/03/03   Cancelled Treatment:    Reason Eval/Treat Not Completed: Medical issues which prohibited therapy (Troponin was 34 down to 28 this AM, no clear cardiac diagnos)is although cardiology is calling this NSTEMI and stating her condition is not likely to be a true cardiac event.  Will hold to tomorrow and see if her condition is more stable.   Ivar Drape 04/02/2015, 10:00 AM   Samul Dada, PT MS Acute Rehab Dept. Number: [facility] [id number] and [facility] [phone]

## 2015-04-02 NOTE — Progress Notes (Signed)
Cardiologist: Saw Dr. Jens Som last in 2014  Subjective: She is not having any chest pain, no SOB. No oozing from head laceration.     Objective:  Vital Signs in the last 24 hours: Temp:  [97.4 F (36.3 C)-98.2 F (36.8 C)] 97.5 F (36.4 C) (01/27 0554) Pulse Rate:  [28-73] 73 (01/27 0554) Resp:  [16-38] 18 (01/27 0554) BP: (103-124)/(46-90) 104/46 mmHg (01/27 0554) SpO2:  [92 %-100 %] 93 % (01/27 0554) Weight:  [170 lb (77.111 kg)-179 lb 6.4 oz (81.375 kg)] 179 lb 6.4 oz (81.375 kg) (01/26 2046)  Intake/Output from previous day: 01/26 0701 - 01/27 0700 In: 82.9 [I.V.:82.9] Out: 340 [Urine:340]   Physical Exam: General: Well developed, well nourished, in no acute distress. Head:  Normocephalic with scalp wound. Lungs: Clear to auscultation and percussion. Heart: Normal S1 and S2.  No murmur, rubs or gallops.  Abdomen: soft, non-tender, positive bowel sounds. Extremities: No clubbing or cyanosis. No edema. Neurologic: Alert, asks appropriate questions.     Lab Results:  Recent Labs  03/28/2015 1518 04/02/15 0533  WBC 15.4* 14.6*  HGB 11.0* 10.6*  PLT 156 163    Recent Labs  04/03/2015 1518 04/02/15 0533  NA 139 142  K 5.3* 4.6  CL 106 109  CO2 16* 19*  GLUCOSE 175* 137*  BUN 53* 54*  CREATININE 3.80* 3.74*    Recent Labs  04/04/2015 2321 04/02/15 0533  TROPONINI 34.25* 28.42*    Recent Labs  03/29/2015 2321  CHOL 159   No results for input(s): PROTIME in the last 72 hours.  Imaging: Ct Head Wo Contrast  03/22/2015  CLINICAL DATA:  Pain following fall EXAM: CT HEAD WITHOUT CONTRAST CT CERVICAL SPINE WITHOUT CONTRAST TECHNIQUE: Multidetector CT imaging of the head and cervical spine was performed following the standard protocol without intravenous contrast. Multiplanar CT image reconstructions of the cervical spine were also generated. COMPARISON:  Head CT September 01, 2013 FINDINGS: CT HEAD FINDINGS Moderate diffuse atrophy is stable. There is no  intracranial mass, hemorrhage, extra-axial fluid collection, or midline shift. There is patchy small vessel disease in the centra semiovale bilaterally. There is no new gray-white compartment lesion. No acute infarct evident. The bony calvarium appears intact. The mastoid air cells are clear. No intraorbital lesions are identified. CT CERVICAL SPINE FINDINGS There is no demonstrable fracture or spondylolisthesis. The prevertebral soft tissues and predental space regions are normal. There is partial ankylosis at C3-4 and C4-5. There is marked disc space narrowing at C5-6. There is moderately severe disc space narrowing at C6-7. There is facet hypertrophy to varying degrees at most levels bilaterally. There is calcified central disc protrusion at C4-5 and C6-7 ; there is no frank stenosis at these levels. There are foci of calcification in each carotid artery. There is also calcification in the aortic arch region. There is marked osteoarthritic change in the right temporomandibular joint. IMPRESSION: CT head: Stable atrophy with periventricular small vessel disease. No intracranial mass, hemorrhage, or extra-axial fluid collection. No acute infarct evident. CT cervical spine: No fracture or spondylolisthesis. Extensive spondylosis and osteoarthritic change at multiple levels. Calcification is noted in both carotid arteries. There is marked osteoarthritic change in the right temporomandibular joint. Electronically Signed   By: Bretta Bang III M.D.   On: 03/13/2015 14:23   Ct Cervical Spine Wo Contrast  03/28/2015  CLINICAL DATA:  Pain following fall EXAM: CT HEAD WITHOUT CONTRAST CT CERVICAL SPINE WITHOUT CONTRAST TECHNIQUE: Multidetector CT imaging of the head and  cervical spine was performed following the standard protocol without intravenous contrast. Multiplanar CT image reconstructions of the cervical spine were also generated. COMPARISON:  Head CT September 01, 2013 FINDINGS: CT HEAD FINDINGS Moderate diffuse  atrophy is stable. There is no intracranial mass, hemorrhage, extra-axial fluid collection, or midline shift. There is patchy small vessel disease in the centra semiovale bilaterally. There is no new gray-white compartment lesion. No acute infarct evident. The bony calvarium appears intact. The mastoid air cells are clear. No intraorbital lesions are identified. CT CERVICAL SPINE FINDINGS There is no demonstrable fracture or spondylolisthesis. The prevertebral soft tissues and predental space regions are normal. There is partial ankylosis at C3-4 and C4-5. There is marked disc space narrowing at C5-6. There is moderately severe disc space narrowing at C6-7. There is facet hypertrophy to varying degrees at most levels bilaterally. There is calcified central disc protrusion at C4-5 and C6-7 ; there is no frank stenosis at these levels. There are foci of calcification in each carotid artery. There is also calcification in the aortic arch region. There is marked osteoarthritic change in the right temporomandibular joint. IMPRESSION: CT head: Stable atrophy with periventricular small vessel disease. No intracranial mass, hemorrhage, or extra-axial fluid collection. No acute infarct evident. CT cervical spine: No fracture or spondylolisthesis. Extensive spondylosis and osteoarthritic change at multiple levels. Calcification is noted in both carotid arteries. There is marked osteoarthritic change in the right temporomandibular joint. Electronically Signed   By: Bretta Bang III M.D.   On: 20-Apr-2015 14:23   Dg Chest Port 1 View  20-Apr-2015  CLINICAL DATA:  Frequent falls, fell today, history of dementia EXAM: PORTABLE CHEST 1 VIEW COMPARISON:  09/01/2013 FINDINGS: Cardiomediastinal silhouette is stable. No acute infiltrate or pleural effusion. No pulmonary edema. Right shoulder prosthesis again noted. Atherosclerotic calcifications of thoracic aorta. Chronic mild elevation of the left hemidiaphragm. Mild right  basilar atelectasis. No gross fractures are noted. Stable postsurgical changes left humeral head. IMPRESSION: No infiltrate or pulmonary edema. Mild right basilar atelectasis. Chronic elevation of the left hemidiaphragm. No gross fractures are noted. Electronically Signed   By: Natasha Mead M.D.   On: 04/20/15 17:27   Dg Knee Complete 4 Views Right  2015/04/20  CLINICAL DATA:  82 year old who fell while at home last night, injuring the right knee. Initial encounter. EXAM: RIGHT KNEE - COMPLETE 4+ VIEW COMPARISON:  09/01/2013. FINDINGS: Severe tricompartment joint space narrowing associated with a very large joint effusion, as noted on the prior examination. Extensive modeling of the tibia related to the chronic degenerative changes. No convincing evidence of an acute fracture. Osseous demineralization. Prepatellar soft tissue swelling/ ecchymosis. Extensive femoropopliteal and tibioperoneal artery atherosclerotic calcification. IMPRESSION: 1. No convincing evidence of acute osseous abnormality. 2. Severe tricompartment osteoarthritis with extensive modeling of the tibia related to the chronic degenerative changes. 3. Chronic very large joint effusion. Electronically Signed   By: Hulan Saas M.D.   On: 04/20/15 14:32   Personally viewed.   Telemetry: No adverse rhythms Personally viewed.   EKG:  Deep T wave diffuse -  Personally viewed.  Cardiac Studies:  ECHO 2015 - normal EF  Meds: Scheduled Meds: . allopurinol  100 mg Oral Daily  . aspirin  325 mg Oral Daily  . atorvastatin  40 mg Oral q1800  . carvedilol  6.25 mg Oral BID WC  . docusate sodium  100 mg Oral BID  . insulin aspart  0-5 Units Subcutaneous QHS  . insulin aspart  0-9 Units  Subcutaneous TID WC  . levothyroxine  50 mcg Oral QAC breakfast  . lidocaine  20 mL Intradermal Once  . lubiprostone  8 mcg Oral BID  . sodium chloride flush  3 mL Intravenous Q12H   Continuous Infusions: . heparin 1,000 Units/hr (04/02/15 0636)    PRN Meds:.acetaminophen **OR** acetaminophen, HYDROcodone-acetaminophen, morphine injection, nystatin-triamcinolone, ondansetron **OR** ondansetron (ZOFRAN) IV, traZODone  Assessment/Plan:  Principal Problem:   Acute renal failure superimposed on stage 4 chronic kidney disease (HCC) Active Problems:   ANEMIA   Essential hypertension   Coronary atherosclerosis   Chronic diastolic heart failure (HCC)   DM (diabetes mellitus) type II controlled with renal manifestation (HCC)   Acute on chronic renal failure (HCC)   Hyperkalemia   Leukocytosis   Falls   Weakness   Dementia   Osteoarthritis   Elevated troponin   Acidosis   82 year old with fall, down prolonged time, RCA stent remote, CK 2000 (muscle), Trop 30.   NSTEMI  - Elevated troponin can be seen in falls however this seems out of proportion and should be classified NSTEMI although it is unlikely that this is true ACS.  - Continue with Heparin IV today, stop tomorrow  - No adverse rhythms on tele  - Continue ASA, Coreg low dose, statin atorva 40  - No invasive workup. Continue with medical mgt.   - High mortality risk   Chronic diastolic HF  - no evidence at this time of vol overload  - hydrated secondary to AKI  History of bradycardia  - low dose coreg  AKI with CKD 4  - hydrating  Essential HTN   - stable  Elevated CK  - muscular breakdown noted.    [name] [name] 04/02/2015, 8:28 AM

## 2015-04-02 NOTE — Consult Note (Signed)
ANTICOAGULATION CONSULT NOTE  Pharmacy Consult for Heparin Indication: chest pain/ACS  Allergies  Allergen Reactions  . Prinivil [Lisinopril]     Pt reports unaware of allergy    Patient Measurements: Height: 5' 2.99" (160 cm) Weight: 179 lb 6.4 oz (81.375 kg) IBW/kg (Calculated) : 52.38 Heparin Dosing Weight: ~69kg  Vital Signs: Temp: 98.1 F (36.7 C) (01/27 1725) Temp Source: Oral (01/27 1725) BP: 103/53 mmHg (01/27 1725) Pulse Rate: 66 (01/27 1725)  Labs:  Recent Labs  04/02/2015 1518 03/13/2015 1641 03/21/2015 2321 04/02/15 0533 04/02/15 1622  HGB 11.0*  --   --  10.6*  --   HCT 34.1*  --   --  31.7*  --   PLT 156  --   --  163  --   HEPARINUNFRC  --   --   --  0.22* 0.41  CREATININE 3.80*  --   --  3.74*  --   CKTOTAL  --  2000*  --   --   --   TROPONINI  --   --  34.25* 28.42*  --     Estimated Creatinine Clearance: 10.3 mL/min (by C-G formula based on Cr of 3.74).   Medical History: Past Medical History  Diagnosis Date  . CAD (coronary artery disease)   . Gout   . Hyperlipemia   . Hypertension   . Diabetes mellitus without complication (HCC)   . MI (myocardial infarction) [facility]) August 1999    PCI RCA  . Anemia   . Diastolic heart failure (HCC)   . Chronic kidney disease, stage IV (severe) (HCC)   . Shortness of breath   . Arthritis     KNEES & BACK   . Dementia     Medications:  No anticoagulants pta  Assessment: 89yof presented to the ED s/p fall. She was noted to have an abnormal EKG and troponin of 26. ARF on CKD noted with sCr 3.8, CrCl ~22ml/min. CBC wnl.  Heparin level = 0.41  Goal of Therapy:  Heparin level 0.3-0.7 units/ml Monitor platelets by anticoagulation protocol: Yes   Plan:  Continue heparin 1000 units / hr Daily HL, CBC Monitor for s/sx of bleeding  Isaac Bliss, PharmD, BCPS, [facility] Clinical Pharmacist Pager [phone] 04/02/2015 6:21 PM

## 2015-04-02 NOTE — Progress Notes (Addendum)
PROGRESS NOTE    [name] SAYEGH [name]:[id number] DOB: 02-08-1924 DOA: 04/26/15 PCP: Laurena Slimmer, MD  HPI/Brief narrative 82 yo F with hx of CAD, diabetes, diastolic heart failure, CKD stage IV who presented to the ER with recurrent falls. No LOC reported. She was found in AKI. ECG demonstrated diffuse t-wave inversions and initial troponin was 28 consistent with NSTEMI.Cardiology consulted for NSTEMI.    Assessment/Plan:   1. NSTEMI: Cardiology was consulted. They indicate that although elevated troponin can be seen in falls, this seems out of proportion and should be classified as NSTEMI although it is unlikely that this is true ACS. She was started on IV heparin infusion on admission 1/26 for 48 hours to stop on 1/28. Continue aspirin, low-dose Coreg and atorvastatin. No invasive workup recommended. Continue medical management. High mortality risk. 2. Acute on stage IV chronic kidney disease: Chart review indicates creatinine 2.4 about a year ago. Admitting creatinine 3.8. Acute kidney injury may be multifactorial related to mild rhabdomyolysis, dehydration. No significant change in creatinine since admission. Continue gentle IV fluids and follow BMP. Check renal ultrasound. Not candidate for long-term dialysis. 3. Mild hyperkalemia: Resolved. 4. Chronic diastolic CHF: Euvolemic at this time. Monitor closely while being hydrated for acute kidney injury. 5. Essential hypertension: Controlled. 6. Mild rhabdomyolysis: Secondary to frequent falls. 7. Anemia: Follow CBCs. 8. Frequent falls: Multifactorial related to advanced age, osteoarthritis, gait instability and deconditioning. CT head without acute abnormalities. X-ray right knee show severe osteoarthritis. PT and OT evaluation. Patient apparently has declined SNF in the past. 9. Type II DM with renal complications: Holding oral glipizide. Reasonable inpatient control. Continue SSI. 10. Hypothyroid: Continue Synthroid. 11. Dementia:  Mental status at baseline. Patient was very unkempt on admission. Concerns about safety. Social worker consulted. 12. Adult failure to thrive: Multifactorial. We'll consult palliative care team for goals of care.  DVT prophylaxis: On IV heparin infusion. Code Status: DO NOT RESUSCITATE Family Communication: Discussed with patient's grandson at bedside. He stated that he can make healthcare decisions for patient. Patient's son who is primary HCPOA is incarcerated until Wednesday of next week. Disposition Plan: To be determined.   Consultants:  Cardiology  Procedures:  None  Antimicrobials:  None   Subjective: Denies complaints. Denies chest pain, dyspnea or pain elsewhere.  Objective: Filed Vitals:   2015/04/26 2046 04/02/15 0554 04/02/15 0959 04/02/15 1001  BP: 103/90 104/46 103/55 101/62  Pulse: 65 73 69 71  Temp: 98.2 F (36.8 C) 97.5 F (36.4 C) 98.1 F (36.7 C)   TempSrc: Oral Oral Oral   Resp: 22 18 18    Height:      Weight: 81.375 kg (179 lb 6.4 oz)     SpO2: 99% 93% 100% 100%    Intake/Output Summary (Last 24 hours) at 04/02/15 1528 Last data filed at 04/02/15 1337  Gross per 24 hour  Intake 612.88 ml  Output    340 ml  Net 272.88 ml   Filed Weights   04-26-15 1700 April 26, 2015 2046  Weight: 77.111 kg (170 lb) 81.375 kg (179 lb 6.4 oz)    Exam:  General exam: Pleasant elderly female lying comfortably propped up in bed. Respiratory system: Clear. No increased work of breathing. Cardiovascular system: S1 & S2 heard, RRR. No JVD, murmurs, gallops, clicks or pedal edema. Tele: SR. Gastrointestinal system: Abdomen is nondistended, soft and nontender. Normal bowel sounds heard. Central nervous system: Alert and oriented. No focal neurological deficits. Extremities: Symmetric 5 x 5 power.   Data  Reviewed: Basic Metabolic Panel:  Recent Labs Lab 2015/04/19 1518 04/02/15 0533  NA 139 142  K 5.3* 4.6  CL 106 109  CO2 16* 19*  GLUCOSE 175* 137*  BUN 53*  54*  CREATININE 3.80* 3.74*  CALCIUM 9.4 8.9   Liver Function Tests: No results for input(s): AST, ALT, ALKPHOS, BILITOT, PROT, ALBUMIN in the last 168 hours. No results for input(s): LIPASE, AMYLASE in the last 168 hours. No results for input(s): AMMONIA in the last 168 hours. CBC:  Recent Labs Lab Apr 19, 2015 1518 04/02/15 0533  WBC 15.4* 14.6*  NEUTROABS 12.2*  --   HGB 11.0* 10.6*  HCT 34.1* 31.7*  MCV 88.6 89.3  PLT 156 163   Cardiac Enzymes:  Recent Labs Lab 2015-04-19 1641 April 19, 2015 2321 04/02/15 0533  CKTOTAL 2000*  --   --   TROPONINI  --  34.25* 28.42*   BNP (last 3 results) No results for input(s): PROBNP in the last 8760 hours. CBG:  Recent Labs Lab 19-Apr-2015 2018 04/19/2015 2043 04/02/15 0751 04/02/15 1127  GLUCAP 139* 144* 133* 149*    No results found for this or any previous visit (from the past 240 hour(s)).       Studies: Ct Head Wo Contrast  04/19/2015  CLINICAL DATA:  Pain following fall EXAM: CT HEAD WITHOUT CONTRAST CT CERVICAL SPINE WITHOUT CONTRAST TECHNIQUE: Multidetector CT imaging of the head and cervical spine was performed following the standard protocol without intravenous contrast. Multiplanar CT image reconstructions of the cervical spine were also generated. COMPARISON:  Head CT September 01, 2013 FINDINGS: CT HEAD FINDINGS Moderate diffuse atrophy is stable. There is no intracranial mass, hemorrhage, extra-axial fluid collection, or midline shift. There is patchy small vessel disease in the centra semiovale bilaterally. There is no new gray-white compartment lesion. No acute infarct evident. The bony calvarium appears intact. The mastoid air cells are clear. No intraorbital lesions are identified. CT CERVICAL SPINE FINDINGS There is no demonstrable fracture or spondylolisthesis. The prevertebral soft tissues and predental space regions are normal. There is partial ankylosis at C3-4 and C4-5. There is marked disc space narrowing at C5-6. There is  moderately severe disc space narrowing at C6-7. There is facet hypertrophy to varying degrees at most levels bilaterally. There is calcified central disc protrusion at C4-5 and C6-7 ; there is no frank stenosis at these levels. There are foci of calcification in each carotid artery. There is also calcification in the aortic arch region. There is marked osteoarthritic change in the right temporomandibular joint. IMPRESSION: CT head: Stable atrophy with periventricular small vessel disease. No intracranial mass, hemorrhage, or extra-axial fluid collection. No acute infarct evident. CT cervical spine: No fracture or spondylolisthesis. Extensive spondylosis and osteoarthritic change at multiple levels. Calcification is noted in both carotid arteries. There is marked osteoarthritic change in the right temporomandibular joint. Electronically Signed   By: Bretta Bang III M.D.   On: Apr 19, 2015 14:23   Ct Cervical Spine Wo Contrast  04/19/15  CLINICAL DATA:  Pain following fall EXAM: CT HEAD WITHOUT CONTRAST CT CERVICAL SPINE WITHOUT CONTRAST TECHNIQUE: Multidetector CT imaging of the head and cervical spine was performed following the standard protocol without intravenous contrast. Multiplanar CT image reconstructions of the cervical spine were also generated. COMPARISON:  Head CT September 01, 2013 FINDINGS: CT HEAD FINDINGS Moderate diffuse atrophy is stable. There is no intracranial mass, hemorrhage, extra-axial fluid collection, or midline shift. There is patchy small vessel disease in the centra semiovale bilaterally. There is  no new gray-white compartment lesion. No acute infarct evident. The bony calvarium appears intact. The mastoid air cells are clear. No intraorbital lesions are identified. CT CERVICAL SPINE FINDINGS There is no demonstrable fracture or spondylolisthesis. The prevertebral soft tissues and predental space regions are normal. There is partial ankylosis at C3-4 and C4-5. There is marked disc  space narrowing at C5-6. There is moderately severe disc space narrowing at C6-7. There is facet hypertrophy to varying degrees at most levels bilaterally. There is calcified central disc protrusion at C4-5 and C6-7 ; there is no frank stenosis at these levels. There are foci of calcification in each carotid artery. There is also calcification in the aortic arch region. There is marked osteoarthritic change in the right temporomandibular joint. IMPRESSION: CT head: Stable atrophy with periventricular small vessel disease. No intracranial mass, hemorrhage, or extra-axial fluid collection. No acute infarct evident. CT cervical spine: No fracture or spondylolisthesis. Extensive spondylosis and osteoarthritic change at multiple levels. Calcification is noted in both carotid arteries. There is marked osteoarthritic change in the right temporomandibular joint. Electronically Signed   By: Bretta Bang III M.D.   On: 03/09/2015 14:23   Dg Chest Port 1 View  03/12/2015  CLINICAL DATA:  Frequent falls, fell today, history of dementia EXAM: PORTABLE CHEST 1 VIEW COMPARISON:  09/01/2013 FINDINGS: Cardiomediastinal silhouette is stable. No acute infiltrate or pleural effusion. No pulmonary edema. Right shoulder prosthesis again noted. Atherosclerotic calcifications of thoracic aorta. Chronic mild elevation of the left hemidiaphragm. Mild right basilar atelectasis. No gross fractures are noted. Stable postsurgical changes left humeral head. IMPRESSION: No infiltrate or pulmonary edema. Mild right basilar atelectasis. Chronic elevation of the left hemidiaphragm. No gross fractures are noted. Electronically Signed   By: Natasha Mead M.D.   On:  17:27   Dg Knee Complete 4 Views Right  03/22/2015  CLINICAL DATA:  82 year old who fell while at home last night, injuring the right knee. Initial encounter. EXAM: RIGHT KNEE - COMPLETE 4+ VIEW COMPARISON:  09/01/2013. FINDINGS: Severe tricompartment joint space narrowing  associated with a very large joint effusion, as noted on the prior examination. Extensive modeling of the tibia related to the chronic degenerative changes. No convincing evidence of an acute fracture. Osseous demineralization. Prepatellar soft tissue swelling/ ecchymosis. Extensive femoropopliteal and tibioperoneal artery atherosclerotic calcification. IMPRESSION: 1. No convincing evidence of acute osseous abnormality. 2. Severe tricompartment osteoarthritis with extensive modeling of the tibia related to the chronic degenerative changes. 3. Chronic very large joint effusion. Electronically Signed   By: Hulan Saas M.D.   On: 03/16/2015 14:32        Scheduled Meds: . allopurinol  100 mg Oral Daily  . aspirin  325 mg Oral Daily  . atorvastatin  40 mg Oral q1800  . carvedilol  6.25 mg Oral BID WC  . docusate sodium  100 mg Oral BID  . insulin aspart  0-5 Units Subcutaneous QHS  . insulin aspart  0-9 Units Subcutaneous TID WC  . levothyroxine  50 mcg Oral QAC breakfast  . lidocaine  20 mL Intradermal Once  . lubiprostone  8 mcg Oral BID  . sodium chloride flush  3 mL Intravenous Q12H   Continuous Infusions: . heparin 1,000 Units/hr (04/02/15 0700)    Principal Problem:   Acute renal failure superimposed on stage 4 chronic kidney disease (HCC) Active Problems:   ANEMIA   Essential hypertension   Coronary atherosclerosis   Chronic diastolic heart failure (HCC)   DM (diabetes mellitus)  type II controlled with renal manifestation (HCC)   Acute on chronic renal failure (HCC)   Hyperkalemia   Leukocytosis   Falls   Weakness   Dementia   Osteoarthritis   Elevated troponin   Acidosis    Time spent: 40 minutes.    Marcellus Scott, MD, FACP, FHM. [facility] Pager [phone]  If 7PM-7AM, please contact night-coverage [email or web address] Password [email or web address] 04/02/2015, 3:28 PM

## 2015-04-02 NOTE — Progress Notes (Signed)
Patient's blood sugar was taken by RN- 121, did not need coverage. Taken again by mistake after patient had consumed beverage.

## 2015-04-02 NOTE — Consult Note (Signed)
ANTICOAGULATION CONSULT NOTE  Pharmacy Consult for Heparin Indication: chest pain/ACS  Allergies  Allergen Reactions  . Prinivil [Lisinopril]     Pt reports unaware of allergy    Patient Measurements: Height: 5' 2.99" (160 cm) Weight: 179 lb 6.4 oz (81.375 kg) IBW/kg (Calculated) : 52.38 Heparin Dosing Weight: ~69kg  Vital Signs: Temp: 97.5 F (36.4 C) (01/27 0554) Temp Source: Oral (01/27 0554) BP: 104/46 mmHg (01/27 0554) Pulse Rate: 73 (01/27 0554)  Labs:  Recent Labs  04-27-15 1518 Apr 27, 2015 1641 Apr 27, 2015 2321 04/02/15 0533  HGB 11.0*  --   --  10.6*  HCT 34.1*  --   --  31.7*  PLT 156  --   --  163  HEPARINUNFRC  --   --   --  0.22*  CREATININE 3.80*  --   --   --   CKTOTAL  --  2000*  --   --   TROPONINI  --   --  34.25*  --     Estimated Creatinine Clearance: 10.1 mL/min (by C-G formula based on Cr of 3.8).   Medical History: Past Medical History  Diagnosis Date  . CAD (coronary artery disease)   . Gout   . Hyperlipemia   . Hypertension   . Diabetes mellitus without complication (HCC)   . MI (myocardial infarction) [facility]) August 1999    PCI RCA  . Anemia   . Diastolic heart failure (HCC)   . Chronic kidney disease, stage IV (severe) (HCC)   . Shortness of breath   . Arthritis     KNEES & BACK   . Dementia     Medications:  No anticoagulants pta  Assessment: 89yof presented to the ED s/p fall. She was noted to have an abnormal EKG and troponin of 26. She will begin IV heparin. ARF on CKD noted with sCr 3.8, CrCl ~80ml/min. CBC wnl.  Heparin level = 0.22  Goal of Therapy:  Heparin level 0.3-0.7 units/ml Monitor platelets by anticoagulation protocol: Yes   Plan:  Heparin to 1000 units / hr 8 hour heparin level  Thank you Okey Regal, PharmD [phone]   04/02/2015,6:33 AM

## 2015-04-03 ENCOUNTER — Inpatient Hospital Stay (HOSPITAL_COMMUNITY): Payer: Medicare Other

## 2015-04-03 DIAGNOSIS — I2510 Atherosclerotic heart disease of native coronary artery without angina pectoris: Secondary | ICD-10-CM

## 2015-04-03 LAB — CBC
HEMATOCRIT: 28.9 % — AB (ref 36.0–46.0)
HEMOGLOBIN: 9.3 g/dL — AB (ref 12.0–15.0)
MCH: 28.4 pg (ref 26.0–34.0)
MCHC: 32.2 g/dL (ref 30.0–36.0)
MCV: 88.4 fL (ref 78.0–100.0)
Platelets: 148 10*3/uL — ABNORMAL LOW (ref 150–400)
RBC: 3.27 MIL/uL — AB (ref 3.87–5.11)
RDW: 15.3 % (ref 11.5–15.5)
WBC: 11.9 10*3/uL — AB (ref 4.0–10.5)

## 2015-04-03 LAB — BASIC METABOLIC PANEL
ANION GAP: 11 (ref 5–15)
BUN: 60 mg/dL — ABNORMAL HIGH (ref 6–20)
CALCIUM: 8.3 mg/dL — AB (ref 8.9–10.3)
CHLORIDE: 109 mmol/L (ref 101–111)
CO2: 18 mmol/L — AB (ref 22–32)
Creatinine, Ser: 4.01 mg/dL — ABNORMAL HIGH (ref 0.44–1.00)
GFR calc non Af Amer: 9 mL/min — ABNORMAL LOW (ref >60)
GFR, EST AFRICAN AMERICAN: 10 mL/min — AB (ref >60)
Glucose, Bld: 134 mg/dL — ABNORMAL HIGH (ref 65–99)
POTASSIUM: 4.1 mmol/L (ref 3.5–5.1)
Sodium: 138 mmol/L (ref 135–145)

## 2015-04-03 LAB — GLUCOSE, CAPILLARY
GLUCOSE-CAPILLARY: 113 mg/dL — AB (ref 65–99)
GLUCOSE-CAPILLARY: 94 mg/dL (ref 65–99)
GLUCOSE-CAPILLARY: 91 mg/dL (ref 65–99)
Glucose-Capillary: 130 mg/dL — ABNORMAL HIGH (ref 65–99)

## 2015-04-03 LAB — URINALYSIS, ROUTINE W REFLEX MICROSCOPIC
Glucose, UA: NEGATIVE mg/dL
Hgb urine dipstick: NEGATIVE
Ketones, ur: NEGATIVE mg/dL
LEUKOCYTES UA: NEGATIVE
NITRITE: NEGATIVE
Protein, ur: 30 mg/dL — AB
SPECIFIC GRAVITY, URINE: 1.014 (ref 1.005–1.030)
pH: 5 (ref 5.0–8.0)

## 2015-04-03 LAB — URINE MICROSCOPIC-ADD ON: RBC / HPF: NONE SEEN RBC/hpf (ref 0–5)

## 2015-04-03 LAB — CK: Total CK: 1431 U/L — ABNORMAL HIGH (ref 38–234)

## 2015-04-03 LAB — HEMOGLOBIN A1C
HEMOGLOBIN A1C: 5.8 % — AB (ref 4.8–5.6)
MEAN PLASMA GLUCOSE: 120 mg/dL

## 2015-04-03 LAB — HEPARIN LEVEL (UNFRACTIONATED): HEPARIN UNFRACTIONATED: 0.19 [IU]/mL — AB (ref 0.30–0.70)

## 2015-04-03 MED ORDER — HEPARIN SODIUM (PORCINE) 5000 UNIT/ML IJ SOLN
5000.0000 [IU] | Freq: Three times a day (TID) | INTRAMUSCULAR | Status: DC
  Administered 2015-04-03 – 2015-04-05 (×4): 5000 [IU] via SUBCUTANEOUS
  Filled 2015-04-03 (×5): qty 1

## 2015-04-03 MED ORDER — CARVEDILOL 3.125 MG PO TABS
3.1250 mg | ORAL_TABLET | Freq: Two times a day (BID) | ORAL | Status: DC
  Administered 2015-04-03 – 2015-04-04 (×3): 3.125 mg via ORAL
  Filled 2015-04-03 (×4): qty 1

## 2015-04-03 NOTE — Progress Notes (Addendum)
Bladder scan done and found out that pt is retaining urine (430cc), informed to MD, Foley catheter inserted (14FR), pt tolerated well. Urine for Culture and UA sent.

## 2015-04-03 NOTE — Progress Notes (Signed)
Patient attempted to pull out indwelling foley catheter again this evening.  This has happened twice today.  Attempt to educate patient on importance of leaving catheter in place, but she was confused.  Soft mitts placed.  Will continue to monitor.

## 2015-04-03 NOTE — Evaluation (Signed)
Physical Therapy Evaluation Patient Details Name: UNNAMED [name] [name] MRN: [id number] DOB: [birth year] Today's Date: 04/03/2015                                                                             History of Present Illness   82 yo F admitted 03/28/2015 with hx of CAD, diabetes, diastolic heart failure, CKD stage IV who presented to the ER with recurrent falls, down unknown amount of time. No LOC reported. She was found in AKI. ECG demonstrated diffuse t-wave inversions and initial troponin was 28 consistent with NSTEMI. Additional diagnoses include: Dementia: Frequent falls Anemia Mild rhabdomyolysis Chronic diastolic CHF Acute on stage IV chronic kidney disease: Adult failure to thrive  Clinical Impression  Pt. Presents to PT with the above diagnoses and with below problem list.  Pt. quite confused and disoriented at times then more lucid at other times during PT session.  At one point, she appeared anxious and needed "to get back home to take care of my children".  Pt. Will benefit from acute PT to address her significant limitations in functional mobility and gait.  I anticipate pt. Will need SNF level care and therapies prior to return to her home environment.  No family present to discuss plans.      Follow Up Recommendations SNF;Supervision/Assistance - 24 hour    Equipment Recommendations  Other (comment) (none if pt. DCs to SNF)    Recommendations for Other Services       Precautions / Restrictions Precautions Precautions: Fall Precaution Comments: history of multiple falls Restrictions Weight Bearing Restrictions: No      Mobility  Bed Mobility Overal bed mobility: Needs Assistance Bed Mobility: Supine to Sit;Sit to Supine     Supine to sit: Max assist Sit to supine: Max assist   General bed mobility comments: Pt. unable to bring self to EOB or back to supine but could assist when therapist provided max assist  Transfers Overall transfer level:  (not able ;  TBD)                  Ambulation/Gait                Stairs            Wheelchair Mobility    Modified Rankin (Stroke Patients Only)       Balance Overall balance assessment: Needs assistance Sitting-balance support: No upper extremity supported;Feet supported Sitting balance-Leahy Scale: Fair Sitting balance - Comments: needed supervision for safely sitting at EOB Postural control: Posterior lean                                   Pertinent Vitals/Pain Pain Assessment: Faces Faces Pain Scale: Hurts little more Pain Location: right shoulder with effort to assist self Pain Descriptors / Indicators: Aching;Discomfort Pain Intervention(s): Limited activity within patient's tolerance;Monitored during session;Repositioned    Home Living Family/patient expects to be discharged to:: Skilled nursing facility (no family present to determine, pt. unable to state)                 Additional Comments: no family present to determine  Prior Function Level of Independence:  (pt. unable to provide reliable responses)               Hand Dominance        Extremity/Trunk Assessment   Upper Extremity Assessment: RUE deficits/detail;LUE deficits/detail RUE Deficits / Details: pt. with obvious arthritic changes in hands (ulnar deviation) and ROM restrictions in hands and shoulders; generalized weakness     LUE Deficits / Details: obvoius ROM deficits in hands and shoulders; generalized weakness          Cervical / Trunk Assessment: Normal  Communication   Communication: No difficulties  Cognition Arousal/Alertness: Awake/alert Behavior During Therapy: WFL for tasks assessed/performed Overall Cognitive Status: History of cognitive impairments - at baseline (h/o dementia, no family present )                      General Comments General comments (skin integrity, edema, etc.): pt. with ulnar deviation in both hands R>L     Exercises        Assessment/Plan    PT Assessment Patient needs continued PT services  PT Diagnosis Difficulty walking;Generalized weakness;Altered mental status   PT Problem List Decreased strength;Decreased range of motion;Decreased activity tolerance;Decreased balance;Decreased mobility;Decreased cognition;Decreased knowledge of use of DME;Decreased safety awareness  PT Treatment Interventions DME instruction;Gait training;Functional mobility training;Therapeutic activities;Therapeutic exercise;Balance training;Patient/family education;Cognitive remediation   PT Goals (Current goals can be found in the Care Plan section) Acute Rehab PT Goals Patient Stated Goal: pt. unable to provide goals PT Goal Formulation: Patient unable to participate in goal setting Time For Goal Achievement: 04/10/15 Potential to Achieve Goals: Fair    Frequency Min 3X/week   Barriers to discharge Other (comment) (unsure of home support)      Co-evaluation               End of Session   Activity Tolerance: Patient tolerated treatment well (tolerated limited mobility well) Patient left: in bed;with bed alarm set Nurse Communication: Mobility status         Time: 1610-9604 PT Time Calculation (min) (ACUTE ONLY): 18 min   Charges:   PT Evaluation $PT Eval Moderate Complexity: 1 Procedure     PT G CodesFerman Hamming 04/03/2015, 4:45 PM Weldon Picking PT Acute Rehab Services [phone]

## 2015-04-03 NOTE — Progress Notes (Signed)
ANTICOAGULATION CONSULT NOTE - Follow Up Consult  Pharmacy Consult for heparin Indication: chest pain/ACS  Labs:  Recent Labs  03/23/2015 1518 03/21/2015 1641 03/08/2015 2321 04/02/15 0533 04/02/15 1622 04/03/15 0441  HGB 11.0*  --   --  10.6*  --   --   HCT 34.1*  --   --  31.7*  --   --   PLT 156  --   --  163  --   --   HEPARINUNFRC  --   --   --  0.22* 0.41 0.19*  CREATININE 3.80*  --   --  3.74*  --   --   CKTOTAL  --  2000*  --   --   --   --   TROPONINI  --   --  34.25* 28.42*  --   --      Assessment: 82yo female now subtherapeutic on heparin after one level at goal.  Goal of Therapy:  Heparin level 0.3-0.7 units/ml   Plan:  Will increase heparin gtt by 2-3 units/kg/hr to 1200 units/hr and check level in 8hr.  Vernard Gambles, PharmD, BCPS  04/03/2015,6:03 AM

## 2015-04-03 NOTE — Progress Notes (Signed)
  Echocardiogram 2D Echocardiogram has been performed.  [name] [name] 04/03/2015, 2:40 PM

## 2015-04-03 NOTE — Progress Notes (Addendum)
PROGRESS NOTE    [name] [name]:[id number] DOB: 08/14/1923 DOA: 03/22/2015 PCP: Laurena Slimmer, MD  HPI/Brief narrative 82 yo F with hx of CAD, diabetes, diastolic heart failure, CKD stage IV who presented to the ER with recurrent falls. No LOC reported. She was found in AKI. ECG demonstrated diffuse t-wave inversions and initial troponin was 28 consistent with NSTEMI.Cardiology consulted for NSTEMI.    Assessment/Plan:   1. NSTEMI: Cardiology was consulted. Unclear if this was primary event or secondary to fall and rhabdomyolysis. May be primary. She completed 48 hours of IV heparin infusion and was discontinued on 1/28. Continue aspirin, low-dose Coreg (dose reduced 1/28 due to hypotension) and atorvastatin. No invasive workup recommended-not cath candidate due to renal failure and comorbidities. Continue medical management. High mortality risk. Discussed with Dr. Gala Romney. 2. Acute on stage IV chronic kidney disease: Chart review indicates creatinine 2.4 about a year ago. Admitting creatinine 3.8. Acute kidney injury may be multifactorial related to mild rhabdomyolysis, dehydration. Worsening creatinine. IVF DC'ed by Cards d/t consern for elevated JVD. Renal ultrasound: medical renal disease. Not candidate for long-term dialysis. Consider Lasix> defer to Nephrology, consulted. 3. Mild hyperkalemia: Resolved. 4. Chronic diastolic CHF: Developing volume overload. IV fluids discontinued. Await nephrology input regarding Lasix. 5. Essential hypertension: Controlled. 6. Mild rhabdomyolysis: Secondary to frequent falls. CK decreasing. 7. Anemia: Hemoglobin gradually dropping. Follow CBCs. 8. Frequent falls: Multifactorial related to advanced age, osteoarthritis, gait instability and deconditioning. CT head without acute abnormalities. X-ray right knee show severe osteoarthritis. PT and OT evaluation. Patient apparently has declined SNF in the past. 9. Type II DM with renal complications:  Holding oral glipizide. Reasonable inpatient control. Continue SSI. 10. Hypothyroid: Continue Synthroid. 11. Dementia: Mental status at baseline. Patient was very unkempt on admission. Concerns about safety. Social worker consulted. 12. Adult failure to thrive: Multifactorial. Palliative care consulted for goals of care from 1/27: Input pending. 13. Acute urinary retention: Bladder scan showed >400 mL urine. Foley catheter.  DVT prophylaxis: SQ heparin. Code Status: DO NOT RESUSCITATE Family Communication: Discussed with patient's grandson at bedside 1/27. He stated that he can make healthcare decisions for patient. Patient's son who is primary HCPOA is incarcerated until Wednesday of next week. None at bedside today. Disposition Plan: To be determined.   Consultants:  Cardiology  Nephrology  Palliative care team  Procedures:  Foley catheter 1/28 >  Antimicrobials:  None   Subjective: Denies complaints. Denies chest pain, dyspnea or pain elsewhere.  Objective: Filed Vitals:   04/02/15 2154 04/03/15 0151 04/03/15 0601 04/03/15 1005  BP: 92/52  93/43 93/75  Pulse:   62 64  Temp:   98.1 F (36.7 C) 98.2 F (36.8 C)  TempSrc:   Oral Oral  Resp:   16 18  Height:      Weight:  86.2 kg (190 lb 0.6 oz)    SpO2:   99% 98%    Intake/Output Summary (Last 24 hours) at 04/03/15 1631 Last data filed at 04/03/15 1606  Gross per 24 hour  Intake    360 ml  Output      0 ml  Net    360 ml   Filed Weights   03/08/2015 1700  2046 04/03/15 0151  Weight: 77.111 kg (170 lb) 81.375 kg (179 lb 6.4 oz) 86.2 kg (190 lb 0.6 oz)    Exam:  General exam: Pleasant elderly female sitting up comfortably on bedside commode. Respiratory system: Occasional basal crackles but otherwise clear to auscultation. No  increased work of breathing. Cardiovascular system: S1 & S2 heard, RRR. No JVD, murmurs, gallops, clicks or pedal edema. Tele: SR. Gastrointestinal system: Abdomen is  nondistended, soft and nontender. Normal bowel sounds heard. Central nervous system: Alert and oriented. No focal neurological deficits. Extremities: Symmetric 5 x 5 power.   Data Reviewed: Basic Metabolic Panel:  Recent Labs Lab 2015/04/09 1518 04/02/15 0533 04/03/15 0441  NA 139 142 138  K 5.3* 4.6 4.1  CL 106 109 109  CO2 16* 19* 18*  GLUCOSE 175* 137* 134*  BUN 53* 54* 60*  CREATININE 3.80* 3.74* 4.01*  CALCIUM 9.4 8.9 8.3*   Liver Function Tests: No results for input(s): AST, ALT, ALKPHOS, BILITOT, PROT, ALBUMIN in the last 168 hours. No results for input(s): LIPASE, AMYLASE in the last 168 hours. No results for input(s): AMMONIA in the last 168 hours. CBC:  Recent Labs Lab 2015/04/09 1518 04/02/15 0533 04/03/15 0441  WBC 15.4* 14.6* 11.9*  NEUTROABS 12.2*  --   --   HGB 11.0* 10.6* 9.3*  HCT 34.1* 31.7* 28.9*  MCV 88.6 89.3 88.4  PLT 156 163 148*   Cardiac Enzymes:  Recent Labs Lab 2015/04/09 1641 04/09/2015 2321 04/02/15 0533 04/03/15 0441  CKTOTAL 2000*  --   --  1431*  TROPONINI  --  34.25* 28.42*  --    BNP (last 3 results) No results for input(s): PROBNP in the last 8760 hours. CBG:  Recent Labs Lab 04/02/15 1644 04/02/15 2138 04/02/15 2157 04/03/15 0815 04/03/15 1133  GLUCAP 116* 121* 147* 113* 130*    No results found for this or any previous visit (from the past 240 hour(s)).       Studies: US Renal  04/02/2015  CLINICAL DATA:  Acute on chronic renal injury EXAM: RENAL / URINARY TRACT ULTRASOUND COMPLETE COMPARISON:  09/04/2013 FINDINGS: Right Kidney: Length: 9.4 cm. Slight increased echogenicity with cortical thinning. Left Kidney: Length: 9.3 cm. Slight increased echogenicity with cortical thinning. Bladder: Appears normal for degree of bladder distention. IMPRESSION: Changes of medical renal disease. Electronically Signed   By: Alcide Clever M.D.   On: 04/02/2015 19:44   Dg Chest Port 1 View  04-09-15  CLINICAL DATA:  Frequent  falls, fell today, history of dementia EXAM: PORTABLE CHEST 1 VIEW COMPARISON:  09/01/2013 FINDINGS: Cardiomediastinal silhouette is stable. No acute infiltrate or pleural effusion. No pulmonary edema. Right shoulder prosthesis again noted. Atherosclerotic calcifications of thoracic aorta. Chronic mild elevation of the left hemidiaphragm. Mild right basilar atelectasis. No gross fractures are noted. Stable postsurgical changes left humeral head. IMPRESSION: No infiltrate or pulmonary edema. Mild right basilar atelectasis. Chronic elevation of the left hemidiaphragm. No gross fractures are noted. Electronically Signed   By: Natasha Mead M.D.   On: 04-09-15 17:27        Scheduled Meds: . allopurinol  100 mg Oral Daily  . aspirin  325 mg Oral Daily  . atorvastatin  40 mg Oral q1800  . carvedilol  3.125 mg Oral BID WC  . docusate sodium  100 mg Oral BID  . heparin  5,000 Units Subcutaneous 3 times per day  . insulin aspart  0-5 Units Subcutaneous QHS  . insulin aspart  0-9 Units Subcutaneous TID WC  . levothyroxine  50 mcg Oral QAC breakfast  . lubiprostone  8 mcg Oral BID  . sodium chloride flush  3 mL Intravenous Q12H   Continuous Infusions:    Principal Problem:   Acute renal failure superimposed on stage 4  chronic kidney disease (HCC) Active Problems:   ANEMIA   Essential hypertension   Coronary atherosclerosis   Chronic diastolic heart failure (HCC)   DM (diabetes mellitus) type II controlled with renal manifestation (HCC)   Acute on chronic renal failure (HCC)   Hyperkalemia   Leukocytosis   Falls   Weakness   Dementia   Osteoarthritis   Elevated troponin   Acidosis   Falls frequently   NSTEMI (non-ST elevated myocardial infarction) (HCC)    Time spent: 20 minutes.    Marcellus Scott, MD, FACP, FHM. [facility] Pager [phone]  If 7PM-7AM, please contact night-coverage [email or web address] Password [email or web address] 04/03/2015, 4:31 PM    LOS: 1 day

## 2015-04-03 NOTE — Progress Notes (Addendum)
Cardiologist: Saw Dr. Jens Som last in 2014  Subjective:   Says she has to pee right now. She is not having any chest pain, no SOB. Able to lie flat in bed.   Creatinine getting worse    Objective:  Vital Signs in the last 24 hours: Temp:  [98.1 F (36.7 C)] 98.1 F (36.7 C) (01/28 0601) Pulse Rate:  [62-71] 62 (01/28 0601) Resp:  [16-20] 16 (01/28 0601) BP: (91-103)/(43-62) 93/43 mmHg (01/28 0601) SpO2:  [99 %-100 %] 99 % (01/28 0601) Weight:  [86.2 kg (190 lb 0.6 oz)] 86.2 kg (190 lb 0.6 oz) (01/28 0151)  Intake/Output from previous day: 01/27 0701 - 01/28 0700 In: 520 [P.O.:480; IV Piggyback:40] Out: -    Physical Exam: General: Elderly woman lying flat in bed  in no acute distress. Neck: Carotids 2+. JVP to jaw  Head:  Normocephalic with scalp wound. Lungs: Clear to auscultation and percussion. Heart: PMI nondisplaced Distant Normal S1 and S2.  No murmur, rubs or gallops.  Abdomen: soft, non-tender, positive bowel sounds. Extremities: No clubbing or cyanosis. No edema. Neurologic: Alert, asks appropriate questions.    Tele: NSR No adverse rhythms Personally viewed.    Lab Results:  Recent Labs  04/02/15 0533 04/03/15 0441  WBC 14.6* 11.9*  HGB 10.6* 9.3*  PLT 163 148*    Recent Labs  04/02/15 0533 04/03/15 0441  NA 142 138  K 4.6 4.1  CL 109 109  CO2 19* 18*  GLUCOSE 137* 134*  BUN 54* 60*  CREATININE 3.74* 4.01*    Recent Labs  2015/04/24 2321 04/02/15 0533  TROPONINI 34.25* 28.42*    Recent Labs  04-24-15 2321  CHOL 159   No results for input(s): PROTIME in the last 72 hours.  Imaging: Ct Head Wo Contrast  04/24/15  CLINICAL DATA:  Pain following fall EXAM: CT HEAD WITHOUT CONTRAST CT CERVICAL SPINE WITHOUT CONTRAST TECHNIQUE: Multidetector CT imaging of the head and cervical spine was performed following the standard protocol without intravenous contrast. Multiplanar CT image reconstructions of the cervical spine were also  generated. COMPARISON:  Head CT September 01, 2013 FINDINGS: CT HEAD FINDINGS Moderate diffuse atrophy is stable. There is no intracranial mass, hemorrhage, extra-axial fluid collection, or midline shift. There is patchy small vessel disease in the centra semiovale bilaterally. There is no new gray-white compartment lesion. No acute infarct evident. The bony calvarium appears intact. The mastoid air cells are clear. No intraorbital lesions are identified. CT CERVICAL SPINE FINDINGS There is no demonstrable fracture or spondylolisthesis. The prevertebral soft tissues and predental space regions are normal. There is partial ankylosis at C3-4 and C4-5. There is marked disc space narrowing at C5-6. There is moderately severe disc space narrowing at C6-7. There is facet hypertrophy to varying degrees at most levels bilaterally. There is calcified central disc protrusion at C4-5 and C6-7 ; there is no frank stenosis at these levels. There are foci of calcification in each carotid artery. There is also calcification in the aortic arch region. There is marked osteoarthritic change in the right temporomandibular joint. IMPRESSION: CT head: Stable atrophy with periventricular small vessel disease. No intracranial mass, hemorrhage, or extra-axial fluid collection. No acute infarct evident. CT cervical spine: No fracture or spondylolisthesis. Extensive spondylosis and osteoarthritic change at multiple levels. Calcification is noted in both carotid arteries. There is marked osteoarthritic change in the right temporomandibular joint. Electronically Signed   By: Bretta Bang III M.D.   On: April 24, 2015 14:23  Ct Cervical Spine Wo Contrast  03/26/2015  CLINICAL DATA:  Pain following fall EXAM: CT HEAD WITHOUT CONTRAST CT CERVICAL SPINE WITHOUT CONTRAST TECHNIQUE: Multidetector CT imaging of the head and cervical spine was performed following the standard protocol without intravenous contrast. Multiplanar CT image reconstructions  of the cervical spine were also generated. COMPARISON:  Head CT September 01, 2013 FINDINGS: CT HEAD FINDINGS Moderate diffuse atrophy is stable. There is no intracranial mass, hemorrhage, extra-axial fluid collection, or midline shift. There is patchy small vessel disease in the centra semiovale bilaterally. There is no new gray-white compartment lesion. No acute infarct evident. The bony calvarium appears intact. The mastoid air cells are clear. No intraorbital lesions are identified. CT CERVICAL SPINE FINDINGS There is no demonstrable fracture or spondylolisthesis. The prevertebral soft tissues and predental space regions are normal. There is partial ankylosis at C3-4 and C4-5. There is marked disc space narrowing at C5-6. There is moderately severe disc space narrowing at C6-7. There is facet hypertrophy to varying degrees at most levels bilaterally. There is calcified central disc protrusion at C4-5 and C6-7 ; there is no frank stenosis at these levels. There are foci of calcification in each carotid artery. There is also calcification in the aortic arch region. There is marked osteoarthritic change in the right temporomandibular joint. IMPRESSION: CT head: Stable atrophy with periventricular small vessel disease. No intracranial mass, hemorrhage, or extra-axial fluid collection. No acute infarct evident. CT cervical spine: No fracture or spondylolisthesis. Extensive spondylosis and osteoarthritic change at multiple levels. Calcification is noted in both carotid arteries. There is marked osteoarthritic change in the right temporomandibular joint. Electronically Signed   By: Bretta Bang III M.D.   On: 03/30/2015 14:23   US Renal  04/02/2015  CLINICAL DATA:  Acute on chronic renal injury EXAM: RENAL / URINARY TRACT ULTRASOUND COMPLETE COMPARISON:  09/04/2013 FINDINGS: Right Kidney: Length: 9.4 cm. Slight increased echogenicity with cortical thinning. Left Kidney: Length: 9.3 cm. Slight increased echogenicity  with cortical thinning. Bladder: Appears normal for degree of bladder distention. IMPRESSION: Changes of medical renal disease. Electronically Signed   By: Alcide Clever M.D.   On: 04/02/2015 19:44   Dg Chest Port 1 View  03/20/2015  CLINICAL DATA:  Frequent falls, fell today, history of dementia EXAM: PORTABLE CHEST 1 VIEW COMPARISON:  09/01/2013 FINDINGS: Cardiomediastinal silhouette is stable. No acute infiltrate or pleural effusion. No pulmonary edema. Right shoulder prosthesis again noted. Atherosclerotic calcifications of thoracic aorta. Chronic mild elevation of the left hemidiaphragm. Mild right basilar atelectasis. No gross fractures are noted. Stable postsurgical changes left humeral head. IMPRESSION: No infiltrate or pulmonary edema. Mild right basilar atelectasis. Chronic elevation of the left hemidiaphragm. No gross fractures are noted. Electronically Signed   By: Natasha Mead M.D.   On: 03/19/2015 17:27   Dg Knee Complete 4 Views Right  03/24/2015  CLINICAL DATA:  82 year old who fell while at home last night, injuring the right knee. Initial encounter. EXAM: RIGHT KNEE - COMPLETE 4+ VIEW COMPARISON:  09/01/2013. FINDINGS: Severe tricompartment joint space narrowing associated with a very large joint effusion, as noted on the prior examination. Extensive modeling of the tibia related to the chronic degenerative changes. No convincing evidence of an acute fracture. Osseous demineralization. Prepatellar soft tissue swelling/ ecchymosis. Extensive femoropopliteal and tibioperoneal artery atherosclerotic calcification. IMPRESSION: 1. No convincing evidence of acute osseous abnormality. 2. Severe tricompartment osteoarthritis with extensive modeling of the tibia related to the chronic degenerative changes. 3. Chronic very large joint effusion.  Electronically Signed   By: Hulan Saas M.D.   On: 2015-04-30 14:32     EKG:  Deep T wave inversion anterolaterally  -  Personally viewed.  Cardiac  Studies:  ECHO 2015 - normal EF  Meds: Scheduled Meds: . allopurinol  100 mg Oral Daily  . aspirin  325 mg Oral Daily  . atorvastatin  40 mg Oral q1800  . carvedilol  3.125 mg Oral BID WC  . docusate sodium  100 mg Oral BID  . insulin aspart  0-5 Units Subcutaneous QHS  . insulin aspart  0-9 Units Subcutaneous TID WC  . levothyroxine  50 mcg Oral QAC breakfast  . lubiprostone  8 mcg Oral BID  . sodium chloride flush  3 mL Intravenous Q12H   Continuous Infusions: . heparin 1,200 Units/hr (04/03/15 0602)   PRN Meds:.acetaminophen **OR** acetaminophen, HYDROcodone-acetaminophen, morphine injection, nystatin-triamcinolone, ondansetron **OR** ondansetron (ZOFRAN) IV, traZODone  Assessment/Plan:  Principal Problem:   Acute renal failure superimposed on stage 4 chronic kidney disease (HCC) Active Problems:   ANEMIA   Essential hypertension   Coronary atherosclerosis   Chronic diastolic heart failure (HCC)   DM (diabetes mellitus) type II controlled with renal manifestation (HCC)   Acute on chronic renal failure (HCC)   Hyperkalemia   Leukocytosis   Falls   Weakness   Dementia   Osteoarthritis   Elevated troponin   Acidosis   Falls frequently   NSTEMI (non-ST elevated myocardial infarction) (HCC)   82 year old with fall, down prolonged time, RCA stent remote, CK 2000 (muscle), Trop 30.   NSTEMI  - Unclear if this was primary event. Or secondary to fall. May be primary. Troponin now coming down. ECG concerning for anterolateral ischemia and possible Wellon's t-waves.   - No adverse rhythms on tele - Can stop heparin - Not cath candidate due to renal failure and comorbidities at this point. Echo pending.   - Continue ASA, Coreg low dose, statin atorva 40  - High mortality risk   - Discussed with Dr. Bennie Pierini.   Chronic diastolic HF  - JVP up. Renal function worse. Would stop IVF at this point. Renal to see. Consider lasix.   AKI with CKD 4  - hydrating  Essential HTN     - stable  Elevated CK/rhabdomyolysis  - muscular breakdown noted.  - can stop IVF now.   Arvilla Meres MD 04/03/2015, 9:44 AM

## 2015-04-04 DIAGNOSIS — I255 Ischemic cardiomyopathy: Secondary | ICD-10-CM

## 2015-04-04 DIAGNOSIS — D696 Thrombocytopenia, unspecified: Secondary | ICD-10-CM

## 2015-04-04 DIAGNOSIS — I5021 Acute systolic (congestive) heart failure: Secondary | ICD-10-CM

## 2015-04-04 DIAGNOSIS — Z515 Encounter for palliative care: Secondary | ICD-10-CM | POA: Insufficient documentation

## 2015-04-04 LAB — BASIC METABOLIC PANEL
Anion gap: 7 (ref 5–15)
BUN: 65 mg/dL — AB (ref 6–20)
CHLORIDE: 110 mmol/L (ref 101–111)
CO2: 19 mmol/L — ABNORMAL LOW (ref 22–32)
CREATININE: 4.65 mg/dL — AB (ref 0.44–1.00)
Calcium: 8.4 mg/dL — ABNORMAL LOW (ref 8.9–10.3)
GFR calc Af Amer: 9 mL/min — ABNORMAL LOW (ref >60)
GFR calc non Af Amer: 8 mL/min — ABNORMAL LOW (ref >60)
GLUCOSE: 94 mg/dL (ref 65–99)
POTASSIUM: 4.2 mmol/L (ref 3.5–5.1)
SODIUM: 136 mmol/L (ref 135–145)

## 2015-04-04 LAB — CBC
HCT: 28.1 % — ABNORMAL LOW (ref 36.0–46.0)
HEMOGLOBIN: 9 g/dL — AB (ref 12.0–15.0)
MCH: 28.5 pg (ref 26.0–34.0)
MCHC: 32 g/dL (ref 30.0–36.0)
MCV: 88.9 fL (ref 78.0–100.0)
Platelets: 128 10*3/uL — ABNORMAL LOW (ref 150–400)
RBC: 3.16 MIL/uL — AB (ref 3.87–5.11)
RDW: 15.4 % (ref 11.5–15.5)
WBC: 8.6 10*3/uL (ref 4.0–10.5)

## 2015-04-04 LAB — GLUCOSE, CAPILLARY
GLUCOSE-CAPILLARY: 124 mg/dL — AB (ref 65–99)
Glucose-Capillary: 76 mg/dL (ref 65–99)
Glucose-Capillary: 105 mg/dL — ABNORMAL HIGH (ref 65–99)
Glucose-Capillary: 104 mg/dL — ABNORMAL HIGH (ref 65–99)
Glucose-Capillary: 123 mg/dL — ABNORMAL HIGH (ref 65–99)

## 2015-04-04 LAB — URINE CULTURE: Culture: NO GROWTH

## 2015-04-04 MED ORDER — LORAZEPAM 0.5 MG PO TABS
0.5000 mg | ORAL_TABLET | Freq: Once | ORAL | Status: AC
  Administered 2015-04-04: 0.5 mg via ORAL
  Filled 2015-04-04 (×2): qty 1

## 2015-04-04 MED ORDER — FUROSEMIDE 10 MG/ML IJ SOLN
160.0000 mg | Freq: Four times a day (QID) | INTRAVENOUS | Status: DC
  Administered 2015-04-04 – 2015-04-05 (×4): 160 mg via INTRAVENOUS
  Filled 2015-04-04 (×6): qty 16

## 2015-04-04 NOTE — Progress Notes (Signed)
Pt agitated and confused. Pt is pulling at IV and foley catheter. RN tried to reorient pt. Safety mittens placed on pt. Pt's grandson at bedside and requesting that MD be called. MD paged. New orders given. Will continue to assess and monitor pt.   [name] [name]

## 2015-04-04 NOTE — Progress Notes (Addendum)
PROGRESS NOTE    [name] [name] ZOX:[id number] DOB: [birth year] DOA: April 08, 2015 PCP: Laurena Slimmer, MD  HPI/Brief narrative 82 yo F with hx of CAD, diabetes, diastolic heart failure, CKD stage IV who presented to the ER with recurrent falls. No LOC reported. She was found in AKI. ECG demonstrated diffuse t-wave inversions and initial troponin was 28 consistent with NSTEMI.Cardiology consulted for NSTEMI. Nephrology consulted for acute on chronic kidney disease. Patient rapidly declining. Discussed with grandson. Palliative care team on board..   Assessment/Plan:   1. NSTEMI: Cardiology was consulted. Unclear if this was primary event or secondary to fall and rhabdomyolysis. May be primary. She completed 48 hours of IV heparin infusion and was discontinued on 1/28. Continue aspirin, low-dose Coreg (dose reduced 1/28 due to hypotension) and atorvastatin. No invasive workup recommended-not cath candidate due to renal failure and comorbidities. Continue medical management. High mortality risk. Discussed with Dr. Gala Romney. 2. Acute systolic CHF: LVEF 25-30 percent (new cardiomyopathy). Patient volume overloaded. As discussed with Cardiology, trial of high-dose Lasix. Has foley catheter. 3. Acute on stage IV chronic kidney disease: Chart review indicates creatinine 2.4 about a year ago. Admitting creatinine 3.8. Acute kidney injury may be multifactorial related to mild rhabdomyolysis, dehydration & hemodyanamics. Worsening creatinine. IVF DC'ed by Cards 1/28. Renal ultrasound: medical renal disease. Not candidate for long-term dialysis. Nephrology input appreciated-discussed with Dr. Arlean Hopping: During his evaluation, patient noted to be in respiratory distress and lethargic suggesting uremia and he recommended focusing on comfort. Subsequently during cardiology evaluation, patient is tachypneic but alert and cardiology recommends trial of high-dose Lasix to see if CHF and renal functions improve.  However if she continues to decline, would be absolutely reasonable to proceed in comfort care direction.  4. Mild hyperkalemia: Resolved. 5. Essential hypertension: Controlled.Was hypotensive on 1/28 and carvedilol dose was reduced.  6. Mild rhabdomyolysis: Secondary to frequent falls. CK decreasing. 7. Anemia: Hemoglobin stable. 8. Frequent falls: Multifactorial related to advanced age, osteoarthritis, gait instability and deconditioning. CT head without acute abnormalities. X-ray right knee show severe osteoarthritis. PT and OT evaluation. Patient apparently has declined SNF in the past. 9. Type II DM with renal complications: Holding oral glipizide. Reasonable inpatient control. Continue SSI. 10. Hypothyroid: Continue Synthroid. 11. Dementia: Patient was very unkempt on admission. Concerns about safety. Social worker consulted.Some confusion overnight and attempting to remove Foley catheter 2. Mental status probably waxing and waning and may have some element of uremia complicating underlying dementia.  12. Adult failure to thrive: Multifactorial. Palliative care consulted for goals of care. Discussed with palliative care team and who plan to discuss/meet with family today.  13. Acute urinary retention: Bladder scan showed >400 mL urine. Foley catheter. 14. Thrombocytopenia: May be secondary to rhabdomyolysis versus acute illness. Follow CBC in a.m.  DVT prophylaxis: SQ heparin. Code Status: DO NOT RESUSCITATE Family Communication: Discussed with patient's grandson at length on 1/29. Updated care and critical condition of patient and very poor prognosis. He has stated that he can make healthcare decisions for patient. Patient's adopted son who is ? primary HCPOA is incarcerated until Wednesday of next week.  Disposition Plan: To be determined.   Consultants:  Cardiology  Nephrology  Palliative care team  Procedures:  Foley catheter 1/28 >  2-D echo: Study Conclusions  - Left  ventricle: Systolic function was severely reduced. The estimated ejection fraction was in the range of 25% to 30%. Global hypokinesis with regional variation. Doppler parameters are consistent with abnormal left ventricular relaxation (grade  1 diastolic dysfunction). The E/e&' ratio is >15, suggesting elevated LV filling pressure. - Ventricular septum: Septal motion showed paradox. The contour showed diastolic flattening and systolic flattening. - Aortic valve: Moderately calcified with mild to moderate stenosis (consider low output-low gradient stenosis, due to reduced cardiac output). Mild regurgitation. Mean gradient (S): 10 mm Hg. Peak gradient (S): 17 mm Hg. Valve area (VTI): 1.09 cm^2. Valve area (Vmax): 0.99 cm^2. Valve area (Vmean): 0.98 cm^2. - Mitral valve: Calcified annulus. Mildly thickened leaflets . There was trivial regurgitation. - Left atrium: The atrium was normal in size. - Right ventricle: Severely dilated and hypokinetic. Lateral annulus peak S velocity: 5 cm/s. - Tricuspid valve: There was moderate regurgitation. - Pulmonary arteries: PA peak pressure: 57 mm Hg (S). - Systemic veins: The IVC measures <2.1 cm, but does not collapse >50%, suggesting an elevated RA pressure of 8 mmHg.  Impressions:  - Compared to a prior echo in 2015, the LVEF has declined from 55% -> 25%. There is biventricular systolic failure, with a severely dilated RV, elevated left and right heart filling pressures and mild aortic stenosis.   Antimicrobials:  None   Subjective: Denies complaints. Impaired memory. Keeps repeating same questions that have been answered.  Objective: Filed Vitals:   04/03/15 1005 04/03/15 2043 04/04/15 0531 04/04/15 0957  BP: 93/75 110/52 108/56 103/48  Pulse: 64 57 59 58  Temp: 98.2 F (36.8 C) 98.5 F (36.9 C) 97.6 F (36.4 C) 98.7 F (37.1 C)  TempSrc: Oral   Oral  Resp: Height:      Weight:   82.101 kg (181 lb)    SpO2: 98% 97% 100% 97%    Intake/Output Summary (Last 24 hours) at 04/04/15 1338 Last data filed at 04/04/15 1255  Gross per 24 hour  Intake    812 ml  Output      0 ml  Net    812 ml   Filed Weights   03/19/2015 2046 04/03/15 0151 04/03/15 2043  Weight: 81.375 kg (179 lb 6.4 oz) 86.2 kg (190 lb 0.6 oz) 82.101 kg (181 lb)    Exam:  General exam: Pleasant elderly female lying propped up in bed, slightly ill-looking and mildly tachypneic.  Respiratory system: Reduced breath sounds in the bases with few basal crackles but otherwise clear to auscultation. Mild increased work of breathing. Cardiovascular system: S1 & S2 heard, RRR. No murmurs, gallops, clicks or pedal edema. JVD +. Tele: SR. Gastrointestinal system: Abdomen is nondistended, soft and nontender. Normal bowel sounds heard. Central nervous system: Alert and oriented to self and place. No focal neurological deficits. Extremities: Symmetric 5 x 5 power.   Data Reviewed: Basic Metabolic Panel:  Recent Labs Lab 03/27/2015 1518 04/02/15 0533 04/03/15 0441 04/04/15 0537  NA 139 142 138 136  K 5.3* 4.6 4.1 4.2  CL 106 109 109 110  CO2 16* 19* 18* 19*  GLUCOSE 175* 137* 134* 94  BUN 53* 54* 60* 65*  CREATININE 3.80* 3.74* 4.01* 4.65*  CALCIUM 9.4 8.9 8.3* 8.4*   Liver Function Tests: No results for input(s): AST, ALT, ALKPHOS, BILITOT, PROT, ALBUMIN in the last 168 hours. No results for input(s): LIPASE, AMYLASE in the last 168 hours. No results for input(s): AMMONIA in the last 168 hours. CBC:  Recent Labs Lab 03/11/2015 1518 04/02/15 0533 04/03/15 0441 04/04/15 0537  WBC 15.4* 14.6* 11.9* 8.6  NEUTROABS 12.2*  --   --   --   HGB 11.0* 10.6*  9.3* 9.0*  HCT 34.1* 31.7* 28.9* 28.1*  MCV 88.6 89.3 88.4 88.9  PLT 156 163 148* 128*   Cardiac Enzymes:  Recent Labs Lab 03/21/2015 1641 03/30/2015 2321 04/02/15 0533 04/03/15 0441  CKTOTAL 2000*  --   --  1431*  TROPONINI  --  34.25* 28.42*   --    BNP (last 3 results) No results for input(s): PROBNP in the last 8760 hours. CBG:  Recent Labs Lab 04/03/15 1646 04/03/15 2034 04/04/15 0810 04/04/15 0955 04/04/15 1152  GLUCAP 94 91 76 105* 104*    Recent Results (from the past 240 hour(s))  Culture, Urine     Status: None   Collection Time: 04/03/15  3:46 PM  Result Value Ref Range Status   Specimen Description URINE, CATHETERIZED  Final   Special Requests NONE  Final   Culture NO GROWTH 1 DAY  Final   Report Status 04/04/2015 FINAL  Final         Studies: US Renal  04/02/2015  CLINICAL DATA:  Acute on chronic renal injury EXAM: RENAL / URINARY TRACT ULTRASOUND COMPLETE COMPARISON:  09/04/2013 FINDINGS: Right Kidney: Length: 9.4 cm. Slight increased echogenicity with cortical thinning. Left Kidney: Length: 9.3 cm. Slight increased echogenicity with cortical thinning. Bladder: Appears normal for degree of bladder distention. IMPRESSION: Changes of medical renal disease. Electronically Signed   By: Alcide Clever M.D.   On: 04/02/2015 19:44        Scheduled Meds: . allopurinol  100 mg Oral Daily  . aspirin  325 mg Oral Daily  . atorvastatin  40 mg Oral q1800  . carvedilol  3.125 mg Oral BID WC  . docusate sodium  100 mg Oral BID  . heparin  5,000 Units Subcutaneous 3 times per day  . insulin aspart  0-5 Units Subcutaneous QHS  . insulin aspart  0-9 Units Subcutaneous TID WC  . levothyroxine  50 mcg Oral QAC breakfast  . lubiprostone  8 mcg Oral BID  . sodium chloride flush  3 mL Intravenous Q12H   Continuous Infusions:    Principal Problem:   Acute renal failure superimposed on stage 4 chronic kidney disease (HCC) Active Problems:   ANEMIA   Essential hypertension   Coronary atherosclerosis   Chronic diastolic heart failure (HCC)   DM (diabetes mellitus) type II controlled with renal manifestation (HCC)   Acute on chronic renal failure (HCC)   Hyperkalemia   Leukocytosis   Falls   Weakness    Dementia   Osteoarthritis   Elevated troponin   Acidosis   Falls frequently   NSTEMI (non-ST elevated myocardial infarction) (HCC)    Time spent: 40 minutes.    Marcellus Scott, MD, FACP, FHM. [facility] Pager [phone]  If 7PM-7AM, please contact night-coverage [email or web address] Password [email or web address] 04/04/2015, 1:38 PM    LOS: 2 days

## 2015-04-04 NOTE — Consult Note (Signed)
Renal Service Consult Note [facility]  [name] [name] 04/04/2015 [name] [name] D Requesting Physician:  Dr [name] [name]  Reason for Consult:  Acute on CKD 4 HPI: The patient is a 82 y.o. year-old with hx of HTN, diast CHF, DJD, anemia, dementia/ deconditioning and CKD stage IV who presented with gen weakness, difficulty ambulating. Lives w grandson. Falls. In ED creat was up to 3.80 from last measurement of 1.8- 2.6 in 2015.  So pt was admitted and started on IVF's. Cardiology saw for ^trop and TWI on EKG. Also ^wbc, anemia, hyperkalemia. Poor historian due to dementia. Head CT negative. Trop 28, NTEMI per cardiology. Medical Rx w IV hep. Cont asa/BB/ statin. LVEF 55%. Not candidate for invasive Rx. Hx prior [name] stent. Creatinine rising , asked to see for a/c renal fialure.   Patient is dyspneic, up 5kg from admission. Initial CXR on 1/26 neg for CHF.  Confused but answers most simple questions, falling asleep during conversation. Denies SOB, CP , leg edema, answers no to most ROS questions.      Prior hospital admissions: 2004 - DM, gout/ anemia / DJD > L TKA 2008 - R shoulder replacement, CKD 2011 - CAP, a/c renal failure 3, DM2, chest pain ruled out 2014 - gen weakness/ deconditioning, bradycardia on coreg > R HF/ edema/ vol excess / acute on chron renal failure, rx with diuresis, dec'd coreg and ^'d lasix dose Jun 2015 - weak/ lethargy/ falls, low Na+/ underlying dementia, Na 118, creat 2.46/UA negative, CXR neg > rx with holding lasix and IVF"s, dc Na 130. Needed SNF placement.   Date    Creat   eGFR 2008-10  1.8- 2.1 2011   2.8- 3.0 2014   3.0- 3.5 2015   1.8- 2.6 18- 27  Apr 01, 2015  3.80 Jan 27   3.74 Jan 28   4.01 Jan 29   4.65    ROS  n/a  Past Medical History  Past Medical History  Diagnosis Date  . CAD (coronary artery disease)   . Gout   . Hyperlipemia   . Hypertension   . Diabetes mellitus without complication ([name] [name])   . MI (myocardial  infarction) [name] [name]) August 1999    PCI [name]  . Anemia   . Diastolic heart failure ([place])   . Chronic kidney disease, stage IV (severe) ([place])   . Shortness of breath   . Arthritis     KNEES & BACK   . Dementia    Past Surgical History  Past Surgical History  Procedure Laterality Date  . Abdominal surgery    . Replacement total knee Left   . Total shoulder replacement Right   . Appendectomy    . Cholecystectomy    . Cardiac catheterization  1999    LAD OK, OM2 70%, [name] 90/80% with spont dissection, S/[name] [name]   Family History  Family History  Problem Relation Age of Onset  . Arthritis Mother     Died at age 7  . Arthritis Sister    Social History  reports that she has quit smoking. She has never used smokeless tobacco. She reports that she does not drink alcohol or use illicit drugs. Allergies  Allergies  Allergen Reactions  . [name] [Lisinopril]     Pt reports unaware of allergy   Home medications Prior to Admission medications   Medication Sig Start Date End Date Taking? Authorizing Provider  allopurinol (ZYLOPRIM) 100 MG tablet Take 100 mg by mouth daily.  Yes Historical Provider, MD  aspirin EC 81 MG tablet Take 81 mg by mouth daily.   Yes Historical Provider, MD  carvedilol (COREG) 6.25 MG tablet Take 6.25 mg by mouth 2 (two) times daily with a meal.   Yes Historical Provider, MD  docusate sodium 100 MG CAPS Take 100 mg by mouth 2 (two) times daily. 09/05/13  Yes [name] Canterbury, MD  glipiZIDE (GLUCOTROL XL) 2.5 MG 24 hr tablet Take 2.5 mg by mouth daily with breakfast.   Yes Historical Provider, MD  levothyroxine (SYNTHROID, LEVOTHROID) 50 MCG tablet Take 50 mcg by mouth daily before breakfast.   Yes Historical Provider, MD  lubiprostone (AMITIZA) 8 MCG capsule Take 8 mcg by mouth 2 (two) times daily.    Yes Historical Provider, MD  nystatin-triamcinolone (MYCOLOG II) cream Apply 1 application topically daily as needed (antifungal).    Yes Historical Provider, MD    Liver Function Tests No results for input(s): AST, ALT, ALKPHOS, BILITOT, PROT, ALBUMIN in the last 168 hours. No results for input(s): LIPASE, AMYLASE in the last 168 hours. CBC  Recent Labs Lab 03/30/2015 1518 04/02/15 0533 04/03/15 0441 04/04/15 0537  WBC 15.4* 14.6* 11.9* 8.6  NEUTROABS 12.2*  --   --   --   HGB 11.0* 10.6* 9.3* 9.0*  HCT 34.1* 31.7* 28.9* 28.1*  MCV 88.6 89.3 88.4 88.9  PLT 156 163 148* 401*   Basic Metabolic Panel  Recent Labs Lab 04/03/2015 1518 04/02/15 0533 04/03/15 0441 04/04/15 0537  NA 139 142 138 136  K 5.3* 4.6 4.1 4.2  CL 106 109 109 110  CO2 16* 19* 18* 19*  GLUCOSE 175* 137* 134* 94  BUN 53* 54* 60* 65*  CREATININE 3.80* 3.74* 4.01* 4.65*  CALCIUM 9.4 8.9 8.3* 8.4*    Filed Vitals:   04/03/15 0601 04/03/15 1005 04/03/15 2043 04/04/15 0531  BP: 93/43 93/75 110/52 108/56  Pulse: 62 64 57 59  Temp: 98.1 F (36.7 C) 98.2 F (36.8 C) 98.5 F (36.9 C) 97.6 F (36.4 C)  TempSrc: Oral Oral    Resp: '16 18 21 18  ' Height:      Weight:   82.101 kg (181 lb)   SpO2: 99% 98% 97% 100%   Exam Visibly dyspneic, +/- using acc muscles No rash, cyanosis or gangrene Sclera anicteric, throat clear +JVD Chest bibasilar rales L > R RRR no RG Abd no ascites, no hsm, ntnd +bs GU foley in place MS no joint effusion Ext no sig LE or UE edema Neuro is lethargic, falls asleep in conversation, responds to most questions No myoclonus noted  CXR 1/26 no acute disease Renal US 9.3- 9.4 cm kidneys, echogenic Wt 77 > 82 kg Trop 28  Assessment: 1 Acute/ CKD 4 - prob component of cardiorenal, baseline renal function poor and patient is not a candidate for dialysis given comorbidities.  She is nearly in distress this morning, and renal function is deteriorating rapidly. I talked with the patient regarding the seriousness of the situation as it stands, but I'm not sure if she will remember the conversation or not. She is very lethargic which is uremia.   Would suggest focusing on comfort at this time. Have d/w primary MD and palliative care team and have spoken to the grandson on the phone.  2 Syst CHF EF 30-35% 3 HTN on coreg only, BP's soft 4 CAD hx MI/ PCI 1999 5 DM on insulin       Plan - as above  Kelly Splinter MD [employer] pager [phone]    cell [phone] 04/04/2015, 8:34 AM

## 2015-04-04 NOTE — Progress Notes (Signed)
RN attempted to give pt 1X dose of Ativan. Pt refused. Endorsed to Public relations account executive.

## 2015-04-04 NOTE — Consult Note (Signed)
Consultation Note Date: 04/04/2015   Patient Name: [name] [name]  DOB: Mar 12, 1923  MRN: [id number]  Age / Sex: 82 y.o., female  PCP: Laurena Slimmer, MD Referring Physician: Elease Etienne, MD  Reason for Consultation: Establishing goals of care    Clinical Assessment/Narrative: Pt is a 82 yo female with h/o dementia, CAD, DM, dCHF, CKD IV who presented to ED after a fall at home. She was found to have an initial tropon of 34. On 1/27 troponin 28.42. She also sustained acute kidney injury and despite treatment her creatinine is worsening, today 4.65. Pt herself has short term memory deficits. Can only answer brief simple questions. She has a son, Bernette Redbird, who is her Management consultant but he is in [place] jail until 04/07/15. Her grandson, Larna Daughters, has been involved in her care during this hospitalization. Mrs. Moorehouse tells me she does not believe it "is her time". She does state she would not want to linger though if she could not get better. She describes better as being able to return home to her life before the heart attack. She states she feels very tired, unwell but denies pain, nausea or SHOB. She does look as though she has increased work of Pensions consultant in Discussion: Primary Decision Maker: Jacques Navy Relationship to Patient son HCPOA: yes  Larey Brick Scales has been involved in her care during this hospitalization. Pt states her son and grandson live with her. Son  is in jail at [facility] [phone], until 04/07/15. Did speak to Mr. Hasley and apprise him of his mother's serious condition. Have asked for Chaplain to eval pt for early release  SUMMARY OF RECOMMENDATIONS Pt not a candidate for HD or aggressive cardiac interventions Trying lasix for fluid overload but no other aggressive measures offered Difficult family situation as son is in jail at the time of  his mother's fall and MI In-pt hospice has been introduced Comfort care concepts introduced Prognosis of just days to weeks discussed.  Code Status/Advance Care Planning: DNR    Code Status Orders        Start     Ordered   04-10-15 1751  Do not attempt resuscitation (DNR)   Continuous    Question Answer Comment  In the event of cardiac or respiratory ARREST Do not call a "code blue"   In the event of cardiac or respiratory ARREST Do not perform Intubation, CPR, defibrillation or ACLS   In the event of cardiac or respiratory ARREST Use medication by any route, position, wound care, and other measures to relive pain and suffering. May use oxygen, suction and manual treatment of airway obstruction as needed for comfort.      04-10-15 1750    Code Status History    Date Active Date Inactive Code Status Order ID Comments User Context   10-Apr-2015  4:50 PM 2015-04-10  5:50 PM Full Code [id number]  Gwenyth Bender, NP ED   09/01/2013  4:37 PM 09/05/2013  6:27 PM Full Code [id number]  Catarina Hartshorn, MD ED   08/24/2012 11:14 PM 08/27/2012  6:05 PM Full Code [id number]  Jonah Blue, DO ED      Other Directives:None  Symptom Management:   Pain: Agree with low dose MS04. Monitor and titrate for effect  Dyspnea: Cont 02 as tolerated; IV lasix for fluid overload, then opids  Palliative Prophylaxis:   Aspiration, Bowel Regimen, Frequent Pain Assessment, Oral Care and Turn Reposition  Additional Recommendations (Limitations, Scope,  Preferences):  Ideally would like for son Adalyn Pennock to see his mother in hospital before further disposition to hospice engaged as he has been in jail    Psycho-social/Spiritual:  Support System: Adequate Desire for further Chaplaincy support:no  Prognosis: Likely would meet in-home hospice for ESRD and depending on progression; in-pt at some point  Discharge Planning: TBD. Fear that pt could not live alone at this point and would need 24/7 care   Chief  Complaint/ Primary Diagnoses: Present on Admission:  . ANEMIA . Essential hypertension . DM (diabetes mellitus) type II controlled with renal manifestation (HCC) . Acute on chronic renal failure (HCC) . Hyperkalemia . Leukocytosis . Falls . Coronary atherosclerosis . Chronic diastolic heart failure (HCC) . Acute renal failure superimposed on stage 4 chronic kidney disease (HCC) . Dementia . Osteoarthritis . Elevated troponin . Acidosis  I have reviewed the medical record, interviewed the patient and family, and examined the patient. The following aspects are pertinent.  Past Medical History  Diagnosis Date  . CAD (coronary artery disease)   . Gout   . Hyperlipemia   . Hypertension   . Diabetes mellitus without complication (HCC)   . MI (myocardial infarction) [facility]) August 1999    PCI RCA  . Anemia   . Diastolic heart failure (HCC)   . Chronic kidney disease, stage IV (severe) (HCC)   . Shortness of breath   . Arthritis     KNEES & BACK   . Dementia    Social History   Social History  . Marital Status: Widowed    Spouse Name: N/A  . Number of Children: N/A  . Years of Education: N/A   Occupational History  . Retired    Social History Main Topics  . Smoking status: Former Games developer  . Smokeless tobacco: Never Used  . Alcohol Use: No  . Drug Use: No  . Sexual Activity: Not Asked   Other Topics Concern  . None   Social History Narrative   Her son helps care for her. There is reportedly a family history of coronary artery disease but no further details are available.   Family History  Problem Relation Age of Onset  . Arthritis Mother     Died at age 65  . Arthritis Sister    Scheduled Meds: . allopurinol  100 mg Oral Daily  . aspirin  325 mg Oral Daily  . atorvastatin  40 mg Oral q1800  . carvedilol  3.125 mg Oral BID WC  . docusate sodium  100 mg Oral BID  . heparin  5,000 Units Subcutaneous 3 times per day  . insulin aspart  0-5 Units Subcutaneous  QHS  . insulin aspart  0-9 Units Subcutaneous TID WC  . levothyroxine  50 mcg Oral QAC breakfast  . lubiprostone  8 mcg Oral BID  . sodium chloride flush  3 mL Intravenous Q12H   Continuous Infusions:  PRN Meds:.acetaminophen **OR** acetaminophen, HYDROcodone-acetaminophen, morphine injection, nystatin-triamcinolone, ondansetron **OR** ondansetron (ZOFRAN) IV, traZODone Medications Prior to Admission:  Prior to Admission medications   Medication Sig Start Date End Date Taking? Authorizing Provider  allopurinol (ZYLOPRIM) 100 MG tablet Take 100 mg by mouth daily.   Yes Historical Provider, MD  aspirin EC 81 MG tablet Take 81 mg by mouth daily.   Yes Historical Provider, MD  carvedilol (COREG) 6.25 MG tablet Take 6.25 mg by mouth 2 (two) times daily with a meal.   Yes Historical Provider, MD  docusate sodium 100 MG  CAPS Take 100 mg by mouth 2 (two) times daily. 09/05/13  Yes Renae Fickle, MD  glipiZIDE (GLUCOTROL XL) 2.5 MG 24 hr tablet Take 2.5 mg by mouth daily with breakfast.   Yes Historical Provider, MD  levothyroxine (SYNTHROID, LEVOTHROID) 50 MCG tablet Take 50 mcg by mouth daily before breakfast.   Yes Historical Provider, MD  lubiprostone (AMITIZA) 8 MCG capsule Take 8 mcg by mouth 2 (two) times daily.    Yes Historical Provider, MD  nystatin-triamcinolone (MYCOLOG II) cream Apply 1 application topically daily as needed (antifungal).    Yes Historical Provider, MD   Allergies  Allergen Reactions  . Prinivil [Lisinopril]     Pt reports unaware of allergy    Review of Systems  Constitutional: Positive for activity change and fatigue.  Respiratory: Positive for cough and shortness of breath.   Genitourinary: Positive for difficulty urinating.  Musculoskeletal: Positive for back pain.  Allergic/Immunologic: Negative.   Neurological: Positive for syncope, weakness and light-headedness.  Hematological: Negative.   Psychiatric/Behavioral: Negative.     Physical Exam    Constitutional: She appears well-developed.  Elderly female in no acute distress  HENT:  Sutures to forehead  Respiratory:  Increased work of breathing  GI: Soft. Bowel sounds are normal.  Neurological: She is alert.  Oriented to herself, she is in the hospital because she fell. When prompted she acknowledges she has been told she had a heart attack and kidney trouble.   Skin: Skin is warm and dry.  Psychiatric:  Speech repetitive but thought processes organized. Short term memory deficits present    Vital Signs: BP 103/48 mmHg  Pulse 58  Temp(Src) 98.7 F (37.1 C) (Oral)  Resp 18  Ht 5' 2.99" (1.6 m)  Wt 82.101 kg (181 lb)  BMI 32.07 kg/m2  SpO2 97%  SpO2: SpO2: 97 % O2 Device:SpO2: 97 % O2 Flow Rate: .   IO: Intake/output summary:  Intake/Output Summary (Last 24 hours) at 04/04/15 1032 Last data filed at 04/04/15 1610  Gross per 24 hour  Intake    702 ml  Output      0 ml  Net    702 ml    LBM: Last BM Date: 04/02/15 Baseline Weight: Weight: 77.111 kg (170 lb) Most recent weight: Weight: 82.101 kg (181 lb)      Palliative Assessment/Data:  Flowsheet Rows        Most Recent Value   Intake Tab    Referral Department  Hospitalist   Unit at Time of Referral  Cardiac/Telemetry Unit   Palliative Care Primary Diagnosis  Cardiac   Date Notified  04/02/15   Palliative Care Type  New Palliative care   Reason for referral  Clarify Goals of Care   Date of Admission  04/04/15   Date first seen by Palliative Care  04/04/15   # of days Palliative referral response time  2 Day(s)   # of days IP prior to Palliative referral  1   Clinical Assessment    Palliative Performance Scale Score  40%   Pain Max last 24 hours  0   Pain Min Last 24 hours  0   Dyspnea Max Last 24 Hours  Not able to report   Dyspnea Min Last 24 hours  Not able to report   Nausea Max Last 24 Hours  0   Nausea Min Last 24 Hours  0   Anxiety Max Last 24 Hours  0   Anxiety Min Last 24 Hours  0    Other Max Last 24 Hours  0   Psychosocial & Spiritual Assessment    Palliative Care Outcomes    Palliative Care follow-up planned  Yes, Facility      Additional Data Reviewed:  CBC:    Component Value Date/Time   WBC 8.6 04/04/2015 0537   WBC 8.0 09/19/2013   HGB 9.0* 04/04/2015 0537   HCT 28.1* 04/04/2015 0537   PLT 128* 04/04/2015 0537   MCV 88.9 04/04/2015 0537   NEUTROABS 12.2* 03/30/2015 1518   LYMPHSABS 2.4 03/20/2015 1518   MONOABS 0.8 03/18/2015 1518   EOSABS 0.0 03/24/2015 1518   BASOSABS 0.0 03/12/2015 1518   Comprehensive Metabolic Panel:    Component Value Date/Time   NA 136 04/04/2015 0537   NA 139 09/19/2013   K 4.2 04/04/2015 0537   CL 110 04/04/2015 0537   CO2 19* 04/04/2015 0537   BUN 65* 04/04/2015 0537   BUN 36* 09/19/2013   CREATININE 4.65* 04/04/2015 0537   CREATININE 2.4* 09/19/2013   GLUCOSE 94 04/04/2015 0537   CALCIUM 8.4* 04/04/2015 0537   AST 15 09/19/2013   ALT 11 09/19/2013   ALKPHOS 96 09/19/2013   BILITOT 0.7 09/01/2013 1357   PROT 6.5 09/01/2013 1357   ALBUMIN 3.4* 09/01/2013 1357     Time In: 0945 Time Out: 1100 Time Total: 75 min  Greater than 50%  of this time was spent counseling and coordinating care related to the above assessment and plan. Staffed with Dr. Waymon Amato  Signed by: Irean Hong, NP  Irean Hong, NP  04/04/2015, 10:32 AM  Please contact Palliative Medicine Team phone at [phone] for questions and concerns.

## 2015-04-04 NOTE — Progress Notes (Signed)
Subjective:   Lying in bed. Says she feels fine. Denies CP or dyspnea. Mildly tachypneic,   Renal function getting worse. EF 25-30% on echo, RV down.    Objective:  Vital Signs in the last 24 hours: Temp:  [97.6 F (36.4 C)-98.7 F (37.1 C)] 98.7 F (37.1 C) (01/29 0957) Pulse Rate:  [57-59] 58 (01/29 0957) Resp:  [18-21] 18 (01/29 0957) BP: (103-110)/(48-56) 103/48 mmHg (01/29 0957) SpO2:  [97 %-100 %] 97 % (01/29 0957) Weight:  [82.101 kg (181 lb)] 82.101 kg (181 lb) (01/28 2043)  Intake/Output from previous day: 01/28 0701 - 01/29 0700 In: 600 [P.O.:600] Out: 0    Physical Exam: General:LLying in bed. NAD mildly tachypneic Neck: Carotids 2+. JVP to jaw  Head:  Normocephalic with scalp wound. Lungs: Crackles bialterally Heart: [place]on Foley regular. 2/6 AS 3/6 MR Abdomen: soft, non-tender, positive bowel sounds. Extremities: No clubbing or cyanosis. edema. Neurologic: Lethargic   Tele: NSR No adverse rhythms Personally viewed.    Lab Results:  Recent Labs  04/03/15 0441 04/04/15 0537  WBC 11.9* 8.6  HGB 9.3* 9.0*  PLT 148* 128*    Recent Labs  04/03/15 0441 04/04/15 0537  NA 138 136  K 4.1 4.2  CL 109 110  CO2 18* 19*  GLUCOSE 134* 94  BUN 60* 65*  CREATININE 4.01* 4.65*    Recent Labs  03/29/2015 2321 04/02/15 0533  TROPONINI 34.25* 28.42*    Recent Labs  03/16/2015 2321  CHOL 159   No results for input(s): PROTIME in the last 72 hours.  Imaging: US Renal  04/02/2015  CLINICAL DATA:  Acute on chronic renal injury EXAM: RENAL / URINARY TRACT ULTRASOUND COMPLETE COMPARISON:  09/04/2013 FINDINGS: Right Kidney: Length: 9.4 cm. Slight increased echogenicity with cortical thinning. Left Kidney: Length: 9.3 cm. Slight increased echogenicity with cortical thinning. Bladder: Appears normal for degree of bladder distention. IMPRESSION: Changes of medical renal disease. Electronically Signed   By: Alcide Clever M.D.   On: 04/02/2015 19:44     EKG:   Deep T wave inversion anterolaterally  -  Personally viewed.  Cardiac Studies:  ECHO 2015 - normal EF  Meds: Scheduled Meds: . allopurinol  100 mg Oral Daily  . aspirin  325 mg Oral Daily  . atorvastatin  40 mg Oral q1800  . carvedilol  3.125 mg Oral BID WC  . docusate sodium  100 mg Oral BID  . heparin  5,000 Units Subcutaneous 3 times per day  . insulin aspart  0-5 Units Subcutaneous QHS  . insulin aspart  0-9 Units Subcutaneous TID WC  . levothyroxine  50 mcg Oral QAC breakfast  . lubiprostone  8 mcg Oral BID  . sodium chloride flush  3 mL Intravenous Q12H   Continuous Infusions:   PRN Meds:.acetaminophen **OR** acetaminophen, HYDROcodone-acetaminophen, morphine injection, nystatin-triamcinolone, ondansetron **OR** ondansetron (ZOFRAN) IV, traZODone  Assessment/Plan:  Principal Problem:   Acute renal failure superimposed on stage 4 chronic kidney disease (HCC) Active Problems:   ANEMIA   Essential hypertension   Coronary atherosclerosis   Chronic diastolic heart failure (HCC)   DM (diabetes mellitus) type II controlled with renal manifestation (HCC)   Acute on chronic renal failure (HCC)   Hyperkalemia   Leukocytosis   Falls   Weakness   Dementia   Osteoarthritis   Elevated troponin   Acidosis   Falls frequently   NSTEMI (non-ST elevated myocardial infarction) (HCC)   Assessment/Plan:  1. NSTEMI 2. Acute systolic HF with EF  25-30% and severe RV dysfunction 3. Acute on chronic renal failure 4. Acute hypoxic respiratory failure  5. Dementia  Echo reviewed personally. She has had a large MI now with severe biventricular dysfunction and marked volume overload. She has progressive renal failure. I agree that there is little we can do to help her as she is not candidate for dialysis. Will start high-dose IV lasix and see if we can diurese her to giver her some time for her kidneys to improve. Doubt this will be overly effective but worth a shot. I discussed with her  and her grandson and both realize that she may not survive this admission and may need to switch to comfort care.   Discussed with Dr. Bennie Pierini.    [name] Meres MD 04/04/2015, 1:12 PM

## 2015-04-05 DIAGNOSIS — L8990 Pressure ulcer of unspecified site, unspecified stage: Secondary | ICD-10-CM | POA: Insufficient documentation

## 2015-04-05 DIAGNOSIS — I5041 Acute combined systolic (congestive) and diastolic (congestive) heart failure: Secondary | ICD-10-CM | POA: Insufficient documentation

## 2015-04-05 DIAGNOSIS — I110 Hypertensive heart disease with heart failure: Secondary | ICD-10-CM | POA: Insufficient documentation

## 2015-04-05 LAB — CBC
HEMATOCRIT: 28.2 % — AB (ref 36.0–46.0)
Hemoglobin: 9.3 g/dL — ABNORMAL LOW (ref 12.0–15.0)
MCH: 29.5 pg (ref 26.0–34.0)
MCHC: 33 g/dL (ref 30.0–36.0)
MCV: 89.5 fL (ref 78.0–100.0)
PLATELETS: 127 10*3/uL — AB (ref 150–400)
RBC: 3.15 MIL/uL — AB (ref 3.87–5.11)
RDW: 15.8 % — AB (ref 11.5–15.5)
WBC: 11.3 10*3/uL — ABNORMAL HIGH (ref 4.0–10.5)

## 2015-04-05 LAB — BASIC METABOLIC PANEL
Anion gap: 13 (ref 5–15)
BUN: 72 mg/dL — AB (ref 6–20)
CO2: 19 mmol/L — AB (ref 22–32)
Calcium: 8.7 mg/dL — ABNORMAL LOW (ref 8.9–10.3)
Chloride: 103 mmol/L (ref 101–111)
Creatinine, Ser: 4.89 mg/dL — ABNORMAL HIGH (ref 0.44–1.00)
GFR calc Af Amer: 8 mL/min — ABNORMAL LOW (ref >60)
GFR, EST NON AFRICAN AMERICAN: 7 mL/min — AB (ref >60)
GLUCOSE: 132 mg/dL — AB (ref 65–99)
POTASSIUM: 4.5 mmol/L (ref 3.5–5.1)
Sodium: 135 mmol/L (ref 135–145)

## 2015-04-05 LAB — CK: Total CK: 375 U/L — ABNORMAL HIGH (ref 38–234)

## 2015-04-05 LAB — GLUCOSE, CAPILLARY: GLUCOSE-CAPILLARY: 119 mg/dL — AB (ref 65–99)

## 2015-04-05 MED ORDER — LORAZEPAM 2 MG/ML PO CONC: 1.0000 mg | ORAL | Status: DC | PRN

## 2015-04-05 MED ORDER — LORAZEPAM 2 MG/ML IJ SOLN: 1.0000 mg | INTRAMUSCULAR | Status: DC | PRN

## 2015-04-05 MED ORDER — HALOPERIDOL LACTATE 5 MG/ML IJ SOLN: 0.5000 mg | INTRAMUSCULAR | Status: DC | PRN

## 2015-04-05 MED ORDER — HYDROMORPHONE HCL 1 MG/ML IJ SOLN
0.5000 mg | Freq: Three times a day (TID) | INTRAMUSCULAR | Status: DC
  Administered 2015-04-05 (×2): 0.5 mg via INTRAVENOUS
  Filled 2015-04-05 (×2): qty 1

## 2015-04-05 MED ORDER — SODIUM CHLORIDE 0.9% FLUSH
3.0000 mL | Freq: Two times a day (BID) | INTRAVENOUS | Status: DC
  Administered 2015-04-05 (×2): 3 mL via INTRAVENOUS

## 2015-04-05 MED ORDER — GLYCOPYRROLATE 1 MG PO TABS
1.0000 mg | ORAL_TABLET | ORAL | Status: DC | PRN
  Filled 2015-04-05: qty 1

## 2015-04-05 MED ORDER — HYDROMORPHONE HCL 1 MG/ML IJ SOLN: 0.5000 mg | INTRAMUSCULAR | Status: DC | PRN

## 2015-04-05 MED ORDER — HALOPERIDOL 0.5 MG PO TABS
0.5000 mg | ORAL_TABLET | ORAL | Status: DC | PRN
  Filled 2015-04-05: qty 1

## 2015-04-05 MED ORDER — SODIUM CHLORIDE 0.9 % IV SOLN: 250.0000 mL | INTRAVENOUS | Status: DC | PRN

## 2015-04-05 MED ORDER — LORAZEPAM 1 MG PO TABS: 1.0000 mg | ORAL_TABLET | ORAL | Status: DC | PRN

## 2015-04-05 MED ORDER — SODIUM CHLORIDE 0.9% FLUSH: 3.0000 mL | INTRAVENOUS | Status: DC | PRN

## 2015-04-05 MED ORDER — GLYCOPYRROLATE 0.2 MG/ML IJ SOLN
0.2000 mg | INTRAMUSCULAR | Status: DC | PRN
  Administered 2015-04-05: 0.2 mg via INTRAVENOUS
  Filled 2015-04-05 (×3): qty 1

## 2015-04-05 MED ORDER — POLYVINYL ALCOHOL 1.4 % OP SOLN
1.0000 [drp] | Freq: Four times a day (QID) | OPHTHALMIC | Status: DC | PRN
  Filled 2015-04-05: qty 15

## 2015-04-05 MED ORDER — GLYCOPYRROLATE 0.2 MG/ML IJ SOLN
0.2000 mg | INTRAMUSCULAR | Status: DC | PRN
  Filled 2015-04-05: qty 1

## 2015-04-05 MED ORDER — HALOPERIDOL LACTATE 2 MG/ML PO CONC
0.5000 mg | ORAL | Status: DC | PRN
  Filled 2015-04-05: qty 0.3

## 2015-04-05 MED ORDER — BIOTENE DRY MOUTH MT LIQD: 15.0000 mL | OROMUCOSAL | Status: DC | PRN

## 2015-04-05 NOTE — Progress Notes (Signed)
Chaplain provided spiritual care support to patient.  Chaplain also responded to request by Nursing Staff to provide support for patient when son who visited with the patient under the supervision of [employer].  Follow up was also provided by the Chaplain as needed. Chaplain Janell Quiet [phone]

## 2015-04-05 NOTE — Progress Notes (Signed)
PATIENT ID: [id number] with CAD, diabetes mellitus, chronic diastolic heart failure, CKD IV here with recurrent falls and found to have NSTEMI and acute on chronic kidney disease.    INTERVAL HISTORY: Echo reveals LVEF 25-50% and grade 1 diastolic dysfunction.  Also moderate aortic stenosis, RV severely dilated and hypokinetic.  Worsening renal function with increased furosemide.  She had delirium overnight.   SUBJECTIVE:  Ms. Marlatt denies chest pain or shortness of breath this am.   PHYSICAL EXAM Filed Vitals:   04/04/15 0957 04/04/15 2028 04-08-15 0437 04-08-15 0900  BP: 103/48 121/54 105/61 106/59  Pulse: 58 56 58 65  Temp: 98.7 F (37.1 C) 97.4 F (36.3 C) 97.5 F (36.4 C) 97.9 F (36.6 C)  TempSrc: Oral   Oral  Resp: Height:      Weight:  84.369 kg (186 lb)    SpO2: 97% 99% 99% 99%   General: Ill-appearing.  Lethargic.   Neck: JVP to upper neck at 45 degrees Lungs:  Diminished breath sounds on anterior exam Heart:  RRR.  No m/r/g. Abdomen:  Soft, ND, NT.  +BS Extremities:  No edema  LABS: Lab Results  Component Value Date   TROPONINI 28.42* 04/02/2015   Results for orders placed or performed during the hospital encounter of 03/17/2015 (from the past 24 hour(s))  Glucose, capillary     Status: Abnormal   Collection Time: 04/04/15 11:52 AM  Result Value Ref Range   Glucose-Capillary 104 (H) 65 - 99 mg/dL  Glucose, capillary     Status: Abnormal   Collection Time: 04/04/15  5:08 PM  Result Value Ref Range   Glucose-Capillary 124 (H) 65 - 99 mg/dL  Glucose, capillary     Status: Abnormal   Collection Time: 04/04/15  8:41 PM  Result Value Ref Range   Glucose-Capillary 123 (H) 65 - 99 mg/dL  CBC     Status: Abnormal   Collection Time: 04-08-2015  6:01 AM  Result Value Ref Range   WBC 11.3 (H) 4.0 - 10.5 K/uL   RBC 3.15 (L) 3.87 - 5.11 MIL/uL   Hemoglobin 9.3 (L) 12.0 - 15.0 g/dL   HCT 62.9 (L) 52.8 - 41.3 %   MCV 89.5 78.0 - 100.0 fL   MCH 29.5 26.0  - 34.0 pg   MCHC 33.0 30.0 - 36.0 g/dL   RDW 24.4 (H) 01.0 - 27.2 %   Platelets 127 (L) 150 - 400 K/uL  Basic metabolic panel     Status: Abnormal   Collection Time: 04/08/2015  6:01 AM  Result Value Ref Range   Sodium 135 135 - 145 mmol/L   Potassium 4.5 3.5 - 5.1 mmol/L   Chloride 103 101 - 111 mmol/L   CO2 19 (L) 22 - 32 mmol/L   Glucose, Bld 132 (H) 65 - 99 mg/dL   BUN 72 (H) 6 - 20 mg/dL   Creatinine, Ser 5.36 (H) 0.44 - 1.00 mg/dL   Calcium 8.7 (L) 8.9 - 10.3 mg/dL   GFR calc non Af Amer 7 (L) >60 mL/min   GFR calc Af Amer 8 (L) >60 mL/min   Anion gap 13 5 - 15  CK     Status: Abnormal   Collection Time: April 08, 2015  6:01 AM  Result Value Ref Range   Total CK 375 (H) 38 - 234 U/L  Glucose, capillary     Status: Abnormal   Collection Time: 2015/04/08  7:44 AM  Result Value  Ref Range   Glucose-Capillary 119 (H) 65 - 99 mg/dL    Intake/Output Summary (Last 24 hours) at 03/14/2015 1126 Last data filed at 03/21/2015 1118  Gross per 24 hour  Intake    608 ml  Output    627 ml  Net    -19 ml    Echo 04/03/15: Study Conclusions  - Left ventricle: Systolic function was severely reduced. The estimated ejection fraction was in the range of 25% to 30%. Global hypokinesis with regional variation. Doppler parameters are consistent with abnormal left ventricular relaxation (grade 1 diastolic dysfunction). The E/e&' ratio is >15, suggesting elevated LV filling pressure. - Ventricular septum: Septal motion showed paradox. The contour showed diastolic flattening and systolic flattening. - Aortic valve: Moderately calcified with mild to moderate stenosis (consider low output-low gradient stenosis, due to reduced cardiac output). Mild regurgitation. Mean gradient (S): 10 mm Hg. Peak gradient (S): 17 mm Hg. Valve area (VTI): 1.09 cm^2. Valve area (Vmax): 0.99 cm^2. Valve area (Vmean): 0.98 cm^2. - Mitral valve: Calcified annulus. Mildly thickened leaflets . There was  trivial regurgitation. - Left atrium: The atrium was normal in size. - Right ventricle: Severely dilated and hypokinetic. Lateral annulus peak S velocity: 5 cm/s. - Tricuspid valve: There was moderate regurgitation. - Pulmonary arteries: PA peak pressure: 57 mm Hg (S). - Systemic veins: The IVC measures <2.1 cm, but does not collapse >50%, suggesting an elevated RA pressure of 8 mmHg.  Impressions:  - Compared to a prior echo in 2015, the LVEF has declined from 55% -> 25%. There is biventricular systolic failure, with a severely dilated RV, elevated left and right heart filling pressures and mild aortic stenosis.  ASSESSMENT AND PLAN:  Principal Problem:   Acute renal failure superimposed on stage 4 chronic kidney disease (HCC) Active Problems:   ANEMIA   Essential hypertension   Coronary atherosclerosis   Chronic diastolic heart failure (HCC)   DM (diabetes mellitus) type II controlled with renal manifestation (HCC)   Acute on chronic renal failure (HCC)   Hyperkalemia   Leukocytosis   Falls   Weakness   Dementia   Osteoarthritis   Elevated troponin   Acidosis   Falls frequently   NSTEMI (non-ST elevated myocardial infarction) [facility])   Palliative care encounter   Pressure ulcer   Unfortunately Ms. Tufano has a very poor prognosis with new-onset biventricular systolic heart failure in the setting of NSTEMI.  Her blood pressure is too low for heart failure therapy and her renal function continues to decline with attempts at diuresis.  She is not a candidate for cath or advanced heart failure therapies given her age and renal dysfunction.  Palliative care will be assisting with a goals of care discussion today.  This is appropriate given her poor prognosis and lack of options for medical intervention.  Cardiology will sign off.  Please feel free to call with questions.    Riggins Cisek C. Duke Salvia, MD, [facility] 03/26/2015 11:26 AM

## 2015-04-05 NOTE — Progress Notes (Signed)
Daily Progress Note   Patient Name: [name] [name]       Date: 03/11/2015 DOB: 08-03-1923  Age: 82 y.o. MRN#: [id number] Attending Physician: Elease Etienne, MD Primary Care Physician: Laurena Slimmer, MD Admit Date: April 27, 2015  Reason for Consultation/Follow-up: Psychosocial/spiritual support and Terminal Care  Subjective: Minimally responsive, opens eyes and attempts eye contact.  Interval Events: Transition to full comfort 1/30 Length of Stay: 3 days  Current Medications: Scheduled Meds:  .  HYDROmorphone (DILAUDID) injection  0.5 mg Intravenous 3 times per day  . sodium chloride flush  3 mL Intravenous Q12H  . sodium chloride flush  3 mL Intravenous Q12H    Continuous Infusions:    PRN Meds: sodium chloride, acetaminophen **OR** acetaminophen, antiseptic oral rinse, glycopyrrolate **OR** glycopyrrolate **OR** glycopyrrolate, haloperidol **OR** haloperidol **OR** haloperidol lactate, HYDROmorphone (DILAUDID) injection, LORazepam **OR** LORazepam **OR** LORazepam, nystatin-triamcinolone, ondansetron **OR** ondansetron (ZOFRAN) IV, polyvinyl alcohol, sodium chloride flush  Physical Exam: Physical Exam              Vital Signs: BP 106/59 mmHg  Pulse 65  Temp(Src) 97.9 F (36.6 C) (Oral)  Resp 19  Ht 5' 2.99" (1.6 m)  Wt 84.369 kg (186 lb)  BMI 32.96 kg/m2  SpO2 99% SpO2: SpO2: 99 % O2 Device: O2 Device: Not Delivered O2 Flow Rate:    Intake/output summary:  Intake/Output Summary (Last 24 hours) at 03/27/2015 1122 Last data filed at 03/08/2015 1118  Gross per 24 hour  Intake    608 ml  Output    627 ml  Net    -19 ml   LBM: Last BM Date: 04/04/15 Baseline Weight: Weight: 77.111 kg (170 lb) Most recent weight: Weight: 84.369 kg (186 lb)       Palliative  Assessment/Data: Flowsheet Rows        Most Recent Value   Intake Tab    Referral Department  Hospitalist   Unit at Time of Referral  Cardiac/Telemetry Unit   Palliative Care Primary Diagnosis  Cardiac   Date Notified  04/02/15   Palliative Care Type  New Palliative care   Reason for referral  Clarify Goals of Care   Date of Admission  2015-04-27   Date first seen by Palliative Care  04/04/15   # of days Palliative referral response time  2 Day(s)   # of days IP prior to Palliative referral  1   Clinical Assessment    Palliative Performance Scale Score  10%   Pain Max last 24 hours  Not able to report   Pain Min Last 24 hours  Not able to report   Dyspnea Max Last 24 Hours  -- [increased rate, minimal distress]   Dyspnea Min Last 24 hours  Not able to report   Nausea Max Last 24 Hours  Not able to report   Nausea Min Last 24 Hours  Not able to report   Anxiety Max Last 24 Hours  10   Anxiety Min Last 24 Hours  0   Other Max Last 24 Hours  Not able to report   Psychosocial & Spiritual Assessment    Palliative Care Outcomes    Patient/Family meeting held?  Yes   Who was at the meeting?  Malcolm Metro   Palliative Care Outcomes  Changed to focus on comfort, Counseled regarding hospice, Improved non-pain symptom therapy, Clarified goals of care, Provided end of life care assistance, Provided psychosocial or spiritual support   Palliative Care follow-up planned  Yes, Facility      Additional Data Reviewed: CBC    Component Value Date/Time   WBC 11.3* 03/12/2015 0601   WBC 8.0 09/19/2013   RBC 3.15* 03/08/2015 0601   RBC 3.70* April 26, 2015 2321   HGB 9.3* 03/30/2015 0601   HCT 28.2* 03/22/2015 0601   PLT 127* 03/27/2015 0601   MCV 89.5 03/25/2015 0601   MCH 29.5 03/07/2015 0601   MCHC 33.0 03/16/2015 0601   RDW 15.8* 03/31/2015 0601   LYMPHSABS 2.4 26-Apr-2015 1518   MONOABS 0.8 04-26-2015 1518   EOSABS 0.0 April 26, 2015 1518   BASOSABS 0.0 April 26, 2015 1518    CMP      Component Value Date/Time   NA 135 03/09/2015 0601   NA 139 09/19/2013   K 4.5 03/25/2015 0601   CL 103 04/01/2015 0601   CO2 19* 03/13/2015 0601   GLUCOSE 132* 03/28/2015 0601   BUN 72* 03/11/2015 0601   BUN 36* 09/19/2013   CREATININE 4.89* 03/17/2015 0601   CREATININE 2.4* 09/19/2013   CALCIUM 8.7* 03/31/2015 0601   PROT 6.5 09/01/2013 1357   ALBUMIN 3.4* 09/01/2013 1357   AST 15 09/19/2013   ALT 11 09/19/2013   ALKPHOS 96 09/19/2013   BILITOT 0.7 09/01/2013 1357   GFRNONAA 7* 03/19/2015 0601   GFRAA 8* 03/28/2015 0601       Problem List:  Patient Active Problem List   Diagnosis Date Noted  . Pressure ulcer 03/23/2015  . Palliative care encounter   . Falls frequently   . NSTEMI (non-ST elevated myocardial infarction) (HCC)   . Acute on chronic renal failure (HCC) 04-26-2015  . Hyperglycemia 04-26-2015  . Hyperkalemia 2015-04-26  . Leukocytosis 26-Apr-2015  . Anemia 04-26-15  . Falls Apr 26, 2015  . Weakness 04/26/15  . Acute renal failure superimposed on stage 4 chronic kidney disease (HCC) 26-Apr-2015  . Osteoarthritis 04-26-15  . Elevated troponin 2015-04-26  . Acidosis 2015/04/26  . Dementia   . Cellulitis of leg, right 09/17/2013  . DM (diabetes mellitus) type II controlled with renal manifestation (HCC) 09/10/2013  . Unspecified constipation 09/10/2013  . Physical deconditioning 09/10/2013  . Dehydration with hyponatremia 09/05/2013  . Hyponatremia 09/01/2013  . CKD (chronic kidney disease) stage 4, GFR 15-29 ml/min (HCC) 09/01/2013  . Chronic diastolic heart failure (HCC) 08/25/2012  . Acute right-sided heart failure (  HCC) 08/24/2012  . Edema 07/24/2008  . HYPERLIPIDEMIA 07/22/2008  . GOUT 07/22/2008  . ANEMIA 07/22/2008  . Essential hypertension 07/22/2008  . Coronary atherosclerosis 07/22/2008  . DEGENERATIVE JOINT DISEASE 07/22/2008  . VERTIGO 07/22/2008     Palliative Care Assessment & Plan    1.Code Status:  DNR    Code Status  Orders        Start     Ordered   03/18/2015 1108  Do not attempt resuscitation (DNR)   Continuous    Question Answer Comment  In the event of cardiac or respiratory ARREST Do not call a "code blue"   In the event of cardiac or respiratory ARREST Do not perform Intubation, CPR, defibrillation or ACLS   In the event of cardiac or respiratory ARREST Use medication by any route, position, wound care, and other measures to relive pain and suffering. May use oxygen, suction and manual treatment of airway obstruction as needed for comfort.      04/03/2015 1118    Code Status History    Date Active Date Inactive Code Status Order ID Comments User Context   2015-04-27  5:50 PM 03/19/2015 11:18 AM DNR [id number]  Gwenyth Bender, NP ED   04/27/2015  4:50 PM 04/27/15  5:50 PM Full Code [id number]  Gwenyth Bender, NP ED   09/01/2013  4:37 PM 09/05/2013  6:27 PM Full Code [id number]  Catarina Hartshorn, MD ED   08/24/2012 11:14 PM 08/27/2012  6:05 PM Full Code [id number]  Jonah Blue, DO ED      2. Goals of Care/Additional Recommendations:   Limitations on Scope of Treatment: Full Comfort Care  Desire for further Chaplaincy support:yes  Psycho-social Needs: Caregiving  Support/Resources, Crisis Intervention, Grief/Bereavement Support and Referral to [facility]   3. Symptom Management:  1.Dyspnea: Started her on TID hydromorphone, PRN bolus doses for breakthrough. Goal is non-labored  breathing-comfort.   2. Anxiety/Agitation: Palliative PRNs  4. Palliative Prophylaxis:   Frequent Pain Assessment  5. Prognosis: Hours - Days  6. Discharge Planning:  Anticipated Hospital Death   Care plan was discussed with grandson Ethelene Browns.  NOTE: Contacted Lt. Murphy with [facility], son Iantha Fallen has been notified that his mom was at EOL and a visit is being arranged for today.   Thank you for allowing the Palliative Medicine Team to assist in the care of this patient.   Time In: 10:30 Time Out: 11:35 Total  Time 65 Prolonged Time Billed YES        Edsel Petrin, DO  03/29/2015, 11:22 AM  Please contact Palliative Medicine Team phone at [phone] for questions and concerns.

## 2015-04-05 NOTE — Progress Notes (Signed)
Late entry:  Earlier in shift, patient was combative, demanding her soft mitts to be removed.  According to family members in the room at the time, she then attempted to hit her head against the bed rails.  Patient given 0.5 mg ativan PO.  NP on-call notified, and new orders for bed rail pads received.  Pads placed on bed rails.  Patient calmed down after family members left the room.  Will continue to monitor.

## 2015-04-05 NOTE — Progress Notes (Signed)
Patient ID: [name] [name], female   DOB: [birth year], 82 y.o.   MRN: [id number]  [facility] Progress Note   Assessment/ Plan:   1. Acute renal failure on chronic kidney disease stage IV: Suspected to be acute cardiorenal -unfortunately, poor response to furosemide and continued deterioration of renal function. With her advanced age and poor baseline functional status as well as burden of comorbidities-unable to offer much more at this point. I fully agree with adopting a comfort/palliative care approach at this juncture. Renal service will sign off at this point and please call with questions/concerns. 2. Systolic CHF exacerbation (EF 56-21%): Unfortunately, poor response to furosemide and continued worsening of renal function that limits any further management-dialysis/ultrafiltration not an option. 3. Coronary artery disease status post PCI 4. Altered mental status/delirium; attempted control with anxiolytic therapy noted overnight  Subjective:   Agitated overnight and pulling at IV/Foley catheter as well as near accidental injury to herself.    Objective:   BP 106/59 mmHg  Pulse 65  Temp(Src) 97.9 F (36.6 C) (Oral)  Resp 19  Ht 5' 2.99" (1.6 m)  Wt 84.369 kg (186 lb)  BMI 32.96 kg/m2  SpO2 99%  Intake/Output Summary (Last 24 hours) at 2015-04-09 1025 Last data filed at April 09, 2015 0924  Gross per 24 hour  Intake    608 ml  Output    476 ml  Net    132 ml   Weight change: 2.268 kg (5 lb)  Physical Exam: HYQ:MVHQIONGE, limited verbal responses XBM:[id number] regular, normal rate Resp: Fine rales over both bases-poor inspiratory effort Abd: Soft, flat, nontender Ext: No lower extremity edema  Imaging: No results found.  Labs: BMET  Recent Labs Lab 04/04/2015 1518 04/02/15 0533 04/03/15 0441 04/04/15 0537 04-09-15 0601  NA 139 142 138 136 135  K 5.3* 4.6 4.1 4.2 4.5  CL 106 109 109 110 103  CO2 16* 19* 18* 19* 19*  GLUCOSE 175* 137* 134* 94 132*  BUN 53*  54* 60* 65* 72*  CREATININE 3.80* 3.74* 4.01* 4.65* 4.89*  CALCIUM 9.4 8.9 8.3* 8.4* 8.7*   CBC  Recent Labs Lab 03/23/2015 1518 04/02/15 0533 04/03/15 0441 04/04/15 0537 04/09/2015 0601  WBC 15.4* 14.6* 11.9* 8.6 11.3*  NEUTROABS 12.2*  --   --   --   --   HGB 11.0* 10.6* 9.3* 9.0* 9.3*  HCT 34.1* 31.7* 28.9* 28.1* 28.2*  MCV 88.6 89.3 88.4 88.9 89.5  PLT 156 163 148* 128* 127*   Medications:    . allopurinol  100 mg Oral Daily  . aspirin  325 mg Oral Daily  . atorvastatin  40 mg Oral q1800  . carvedilol  3.125 mg Oral BID WC  . docusate sodium  100 mg Oral BID  . furosemide  160 mg Intravenous Q6H  . heparin  5,000 Units Subcutaneous 3 times per day  . insulin aspart  0-9 Units Subcutaneous TID WC  . levothyroxine  50 mcg Oral QAC breakfast  . lubiprostone  8 mcg Oral BID  . sodium chloride flush  3 mL Intravenous Q12H   Zetta Bills, MD Apr 09, 2015, 10:25 AM

## 2015-04-05 NOTE — Progress Notes (Signed)
PROGRESS NOTE    [name] [name] XBJ:[id number] DOB: [birth year] DOA: 03/20/2015 PCP: Laurena Slimmer, MD  HPI/Brief narrative 82 yo F with hx of CAD, diabetes, diastolic heart failure, CKD stage IV who presented to the ER with recurrent falls. No LOC reported. She was found in AKI. ECG demonstrated diffuse t-wave inversions and initial troponin was 28 consistent with NSTEMI.Cardiology consulted for NSTEMI. Nephrology consulted for acute on chronic kidney disease. Patient rapidly declining. Discussed with grandson. Palliative care team on board, have discussed with family and transitioned to full comfort care on 04/08/22. Anticipate hospital death.   Assessment/Plan:   1. NSTEMI: Cardiology was consulted. Unclear if this was primary event or secondary to fall and rhabdomyolysis. May be primary. She completed 48 hours of IV heparin infusion and was discontinued on 1/28. Treated with medical management and patient continued to rapidly decline. She was transitioned to full comfort care on 04-08-22. 2. Acute systolic CHF: LVEF 25-30 percent (new cardiomyopathy). Patient volume overloaded. Treated with high-dose IV Lasix on 1/29: Although respiratory status has somewhat improved, overall rapid decline with worsening renal functions. Comfort care. 3. Acute on stage IV chronic kidney disease: Chart review indicates creatinine 2.4 about a year ago. Admitting creatinine 3.8. Acute kidney injury may be multifactorial related to mild rhabdomyolysis, dehydration & hemodyanamics. Worsening creatinine. IVF DC'ed by Cards 1/28. Renal ultrasound: medical renal disease. Not candidate for long-term dialysis. Progressively worsening renal functions with uremia. 4. Mild hyperkalemia: Resolved. 5. Essential hypertension: Controlled.Was hypotensive on 1/28 and carvedilol dose was reduced.  6. Mild rhabdomyolysis: Secondary to frequent falls. CK decreasing. 7. Anemia: Hemoglobin stable. 8. Frequent falls: Multifactorial  related to advanced age, osteoarthritis, gait instability and deconditioning. CT head without acute abnormalities. X-ray right knee show severe osteoarthritis. PT and OT evaluation. Patient apparently has declined SNF in the past. 9. Type II DM with renal complications: Holding oral glipizide. Reasonable inpatient control. Continue SSI. 10. Hypothyroid:  11. Dementia: Patient was very unkempt on admission. Concerns about safety. Social worker consulted.Some confusion overnight and attempting to remove Foley catheter 2. Mental status progressively worsening from uremia complicating dementia.  12. Adult failure to thrive: Multifactorial. Discussed with Dr. Phillips Odor this morning. Palliative care input appreciated-have discussed with family and transitioned to full comfort care. Patient's son/healthcare power of attorney is incarcerated and plans are being made for him to visit his mother in the hospital. 13. Acute urinary retention: Bladder scan showed >400 mL urine. Foley catheter. 14. Thrombocytopenia: May be secondary to rhabdomyolysis versus acute illness.  DVT prophylaxis: SQ heparin. Code Status: DO NOT RESUSCITATE and transition to full comfort care on 2022/04/08. Family Communication: None at bedside this morning. Disposition Plan: Expected in hospital death.   Consultants:  Cardiology  Nephrology  Palliative care team  Procedures:  Foley catheter 1/28 >  2-D echo: Study Conclusions  - Left ventricle: Systolic function was severely reduced. The estimated ejection fraction was in the range of 25% to 30%. Global hypokinesis with regional variation. Doppler parameters are consistent with abnormal left ventricular relaxation (grade 1 diastolic dysfunction). The E/e&' ratio is >15, suggesting elevated LV filling pressure. - Ventricular septum: Septal motion showed paradox. The contour showed diastolic flattening and systolic flattening. - Aortic valve: Moderately calcified  with mild to moderate stenosis (consider low output-low gradient stenosis, due to reduced cardiac output). Mild regurgitation. Mean gradient (S): 10 mm Hg. Peak gradient (S): 17 mm Hg. Valve area (VTI): 1.09 cm^2. Valve area (Vmax): 0.99 cm^2. Valve area (Vmean): 0.98  cm^2. - Mitral valve: Calcified annulus. Mildly thickened leaflets . There was trivial regurgitation. - Left atrium: The atrium was normal in size. - Right ventricle: Severely dilated and hypokinetic. Lateral annulus peak S velocity: 5 cm/s. - Tricuspid valve: There was moderate regurgitation. - Pulmonary arteries: PA peak pressure: 57 mm Hg (S). - Systemic veins: The IVC measures <2.1 cm, but does not collapse >50%, suggesting an elevated RA pressure of 8 mmHg.  Impressions:  - Compared to a prior echo in 2015, the LVEF has declined from 55% -> 25%. There is biventricular systolic failure, with a severely dilated RV, elevated left and right heart filling pressures and mild aortic stenosis.   Antimicrobials:  None   Subjective: Drowsy and barely opens eyes. Tachypnea. As per RN, overnight confusion.  Objective: Filed Vitals:   04/04/15 0957 04/04/15 2028 04/04/2015 0437 03/29/2015 0900  BP: 103/48 121/54 105/61 106/59  Pulse: 58 56 58 65  Temp: 98.7 F (37.1 C) 97.4 F (36.3 C) 97.5 F (36.4 C) 97.9 F (36.6 C)  TempSrc: Oral   Oral  Resp: Height:      Weight:  84.369 kg (186 lb)    SpO2: 97% 99% 99% 99%    Intake/Output Summary (Last 24 hours) at 03/21/2015 1231 Last data filed at 03/10/2015 1118  Gross per 24 hour  Intake    608 ml  Output    627 ml  Net    -19 ml   Filed Weights   04/03/15 0151 04/03/15 2043 04/04/15 2028  Weight: 86.2 kg (190 lb 0.6 oz) 82.101 kg (181 lb) 84.369 kg (186 lb)    Exam:  General exam: Ill-looking elderly female lying propped up in bed and tachypnea. Respiratory system: Improved breath sounds compared to yesterday. Mild increased work  of breathing which may be related to uremia and acidosis. Cardiovascular system: S1 & S2 heard, RRR. No murmurs, gallops, clicks or pedal edema. JVD +. Tele: SR. Gastrointestinal system: Abdomen is nondistended, soft and nontender. Normal bowel sounds heard. Central nervous system: Drowsy and barely arousable. No focal neurological deficits. Extremities: Symmetric 5 x 5 power.   Data Reviewed: Basic Metabolic Panel:  Recent Labs Lab 03/23/2015 1518 04/02/15 0533 04/03/15 0441 04/04/15 0537 03/14/2015 0601  NA 139 142 138 136 135  K 5.3* 4.6 4.1 4.2 4.5  CL 106 109 109 110 103  CO2 16* 19* 18* 19* 19*  GLUCOSE 175* 137* 134* 94 132*  BUN 53* 54* 60* 65* 72*  CREATININE 3.80* 3.74* 4.01* 4.65* 4.89*  CALCIUM 9.4 8.9 8.3* 8.4* 8.7*   Liver Function Tests: No results for input(s): AST, ALT, ALKPHOS, BILITOT, PROT, ALBUMIN in the last 168 hours. No results for input(s): LIPASE, AMYLASE in the last 168 hours. No results for input(s): AMMONIA in the last 168 hours. CBC:  Recent Labs Lab 04/04/2015 1518 04/02/15 0533 04/03/15 0441 04/04/15 0537 03/27/2015 0601  WBC 15.4* 14.6* 11.9* 8.6 11.3*  NEUTROABS 12.2*  --   --   --   --   HGB 11.0* 10.6* 9.3* 9.0* 9.3*  HCT 34.1* 31.7* 28.9* 28.1* 28.2*  MCV 88.6 89.3 88.4 88.9 89.5  PLT 156 163 148* 128* 127*   Cardiac Enzymes:  Recent Labs Lab 03/27/2015 1641 03/30/2015 2321 04/02/15 0533 04/03/15 0441 04/03/2015 0601  CKTOTAL 2000*  --   --  1431* 375*  TROPONINI  --  34.25* 28.42*  --   --    BNP (last 3 results) No  results for input(s): PROBNP in the last 8760 hours. CBG:  Recent Labs Lab 04/04/15 0955 04/04/15 1152 04/04/15 1708 04/04/15 2041 03/08/2015 0744  GLUCAP 105* 104* 124* 123* 119*    Recent Results (from the past 240 hour(s))  Culture, Urine     Status: None   Collection Time: 04/03/15  3:46 PM  Result Value Ref Range Status   Specimen Description URINE, CATHETERIZED  Final   Special Requests NONE  Final    Culture NO GROWTH 1 DAY  Final   Report Status 04/04/2015 FINAL  Final         Studies: No results found.      Scheduled Meds: .  HYDROmorphone (DILAUDID) injection  0.5 mg Intravenous 3 times per day  . sodium chloride flush  3 mL Intravenous Q12H  . sodium chloride flush  3 mL Intravenous Q12H   Continuous Infusions:    Principal Problem:   Acute renal failure superimposed on stage 4 chronic kidney disease (HCC) Active Problems:   ANEMIA   Essential hypertension   Coronary atherosclerosis   Chronic diastolic heart failure (HCC)   DM (diabetes mellitus) type II controlled with renal manifestation (HCC)   Acute on chronic renal failure (HCC)   Hyperkalemia   Leukocytosis   Falls   Weakness   Dementia   Osteoarthritis   Elevated troponin   Acidosis   Falls frequently   NSTEMI (non-ST elevated myocardial infarction) (HCC)   Palliative care encounter   Pressure ulcer    Time spent: 20 minutes.    Marcellus Scott, MD, FACP, FHM. [facility] Pager [phone]  If 7PM-7AM, please contact night-coverage [email or web address] Password [email or web address] 03/14/2015, 12:31 PM    LOS: 3 days

## 2015-04-05 NOTE — Clinical Documentation Improvement (Signed)
Internal Medicine  Can the diagnosis of anemia be further specified? Please document findings in next progress note NOT in BPA drop down box. Thanks!   Iron deficiency Anemia  Nutritional anemia, including the nutrition or mineral deficits  Chronic Blood Loss Anemia, including the suspected or known cause  Acute Blood Loss Anemia  Anemia of chronic disease, including the associated chronic disease state  Other  Clinically Undetermined  Document any associated diagnoses/conditions.  Supporting Information:  H&H's for current admission running from 10.6/31.7   to    9/28  Please exercise your independent, professional judgment when responding. A specific answer is not anticipated or expected.  Thank You,  Shellee Milo RN, BSN, CCDS Health Information Management [place] [phone]; Cell: [phone]

## 2015-04-05 NOTE — Progress Notes (Signed)
PT Cancellation Note  Patient Details Name: [name] [name] MRN: [id number] DOB: 04-29-1923   Cancelled Treatment:    Reason Eval/Treat Not Completed:  (pt transitioned to comfort care.) Pt with no further acute PT needs at this time.   Marcene Brawn 03/21/2015, 11:41 AM  Lewis Shock, PT, DPT Pager #: [phone] Office #: [phone]

## 2015-04-07 NOTE — Clinical Documentation Improvement (Signed)
Internal Medicine  Can the site of stage 2 pressure ulcer be further specified? Document findings in next progress note; NOT in PBA drop down box.   Other  Clinically Undetermined  Supporting Information:  Stage 2 is noted on flow sheets but no site.  Please exercise your independent, professional judgment when responding. A specific answer is not anticipated or expected.  Thank You,  Shellee Milo RN, BSN, CCDS Health Information Management [place] [phone]; Cell: [phone]

## 2015-04-07 NOTE — Progress Notes (Signed)
Pt's grandson, Mr. Lennie Odor, notified of pt's declining condition marked by decreased respirations. Pt's grandson stated he is currently in [place] trying to get his family together for his grandma. Pt's grandson was unsure of the funeral home chosen by pt's son, who is currently unavailable due to incarceration. Per grandson's request, I called [facility] to try to gather information about funeral home and spoke to [employer], who stated that chaplain will notify pt's son of the request in the morning per their protocol and for safety precaution

## 2015-04-07 DEATH — deceased

## 2015-05-05 NOTE — Discharge Summary (Addendum)
Death Summary  [name] [name] ZOX:[id number] DOB: 03/29/1923 DOA: 27-Apr-2015  PCP: Laurena Slimmer, MD PCP/Office notified: Forwarded death summary.  Admit date: 04-27-15 Date of Death: 05/01/2015  Final Diagnoses:  Principal Problem:   Acute renal failure superimposed on stage 4 chronic kidney disease (HCC) Active Problems:   ANEMIA   Essential hypertension   Coronary atherosclerosis   Chronic diastolic heart failure (HCC)   DM (diabetes mellitus) type II controlled with renal manifestation (HCC)   Acute on chronic renal failure (HCC)   Hyperkalemia   Leukocytosis   Falls   Weakness   Dementia   Osteoarthritis   Elevated troponin   Acidosis   Falls frequently   NSTEMI (non-ST elevated myocardial infarction) (HCC)   Palliative care encounter   Pressure ulcer   Hypertensive heart disease with heart failure (HCC)   Acute combined systolic and diastolic heart failure (HCC)      History of present illness & Hospital course:  82 yo F with hx of CAD, diabetes, diastolic heart failure, CKD stage IV who presented to the ER with recurrent falls. No LOC reported. She was found in AKI. ECG demonstrated diffuse t-wave inversions and initial troponin was 28 consistent with NSTEMI.Cardiology consulted for NSTEMI. Nephrology consulted for acute on chronic kidney disease. Despite aggressive care, patient rapidly declined without improvement. Discussed extensively with family. Palliative care team was consulted and transitioned to full comfort care on 04-30-2022. Patient subsequently demised.   Hospital Course:  Assessment/Plan:  1. NSTEMI: Cardiology was consulted. Unclear if this was primary event or secondary to fall and rhabdomyolysis. May be primary. She completed 48 hours of IV heparin infusion and was discontinued on 1/28. Treated with medical management and patient continued to rapidly decline. She was transitioned to full comfort care on 04-30-2022. 2. Acute systolic CHF: LVEF 25-30 percent  (new cardiomyopathy). Patient volume overloaded. Treated with high-dose IV Lasix on 1/29: Although respiratory status somewhat improved, overall rapid decline with worsening renal functions. Comfort care. 3. Acute on stage IV chronic kidney disease: Chart review indicates creatinine 2.4 about a year ago. Admitting creatinine 3.8. Acute kidney injury may be multifactorial related to mild rhabdomyolysis, dehydration & hemodyanamics. Worsening creatinine. IVF DC'ed by Cards 1/28. Renal ultrasound: medical renal disease. Not candidate for long-term dialysis. Progressively worsening renal functions with uremia. 4. Mild hyperkalemia: Resolved. 5. Essential hypertension: Controlled.Was hypotensive on 1/28 and carvedilol dose was reduced.  6. Mild rhabdomyolysis: Secondary to frequent falls. CK decreasing. 7. Anemia: Hemoglobin stable. 8. Frequent falls: Multifactorial related to advanced age, osteoarthritis, gait instability and deconditioning. CT head without acute abnormalities. X-ray right knee show severe osteoarthritis. PT and OT evaluation. Patient apparently has declined SNF in the past. 9. Type II DM with renal complications:  10. Hypothyroid:  11. Dementia: Patient was very unkempt on admission. Concerns about safety. Social worker consulted. Mental status progressively worsening from uremia complicating dementia.  12. Adult failure to thrive: Multifactorial. Palliative care input appreciated-have discussed with family and transitioned to full comfort care.  13. Acute urinary retention: Bladder scan showed >400 mL urine. Foley catheter. 14. Thrombocytopenia: May be secondary to rhabdomyolysis versus acute illness. 15. Stage 2 pressure ulcer: clinically undetermined.   Time: 20 minutes.  Signed:  Chue Berkovich  [facility] 04/15/2015, 5:31 PM    Addendum  Responding to CDI Query:   Yes, Stage 2 pressure ulcer to sacrum     Supporting Information (as provided by CDI  specialist, Darla Lesches): -->RN documented Stage 2 pressure ulcer to  sacrum noted on 04/04/2015 at 5:00 AM.  Marcellus Scott, MD, FACP, FHM. [facility] Pager [phone]  If 7PM-7AM, please contact night-coverage [email or web address] Password [facility] 04/18/2015, 4:50 PM
